# Patient Record
Sex: Male | Born: 1945 | Race: Black or African American | Hispanic: No | Marital: Married | Smoking: Former smoker
Health system: Southern US, Community
[De-identification: ages and names within clinical notes are randomized; demographics above are authoritative.]

## PROBLEM LIST (undated history)

## (undated) DIAGNOSIS — M109 Gout, unspecified: Secondary | ICD-10-CM

## (undated) DIAGNOSIS — M1990 Unspecified osteoarthritis, unspecified site: Secondary | ICD-10-CM

## (undated) DIAGNOSIS — I10 Essential (primary) hypertension: Secondary | ICD-10-CM

## (undated) DIAGNOSIS — K219 Gastro-esophageal reflux disease without esophagitis: Secondary | ICD-10-CM

## (undated) DIAGNOSIS — K5900 Constipation, unspecified: Secondary | ICD-10-CM

## (undated) DIAGNOSIS — E119 Type 2 diabetes mellitus without complications: Secondary | ICD-10-CM

## (undated) DIAGNOSIS — F0390 Unspecified dementia without behavioral disturbance: Secondary | ICD-10-CM

## (undated) DIAGNOSIS — M1712 Unilateral primary osteoarthritis, left knee: Secondary | ICD-10-CM

## (undated) HISTORY — PX: NO PAST SURGERIES: SHX2092

---

## 2013-02-16 ENCOUNTER — Encounter (HOSPITAL_COMMUNITY): Payer: Self-pay | Admitting: Respiratory Therapy

## 2013-02-20 ENCOUNTER — Encounter (HOSPITAL_COMMUNITY)
Admission: RE | Admit: 2013-02-20 | Discharge: 2013-02-20 | Disposition: A | Discharge status: Home / Self Care | Payer: Medicare Other | Source: Ambulatory Visit | Attending: Anesthesiology | Admitting: Anesthesiology

## 2013-02-20 ENCOUNTER — Encounter (HOSPITAL_COMMUNITY): Payer: Self-pay

## 2013-02-20 ENCOUNTER — Encounter (HOSPITAL_COMMUNITY)
Admission: RE | Admit: 2013-02-20 | Discharge: 2013-02-20 | Disposition: A | Discharge status: Home / Self Care | Payer: Medicare Other | Source: Ambulatory Visit | Attending: Orthopedic Surgery | Admitting: Orthopedic Surgery

## 2013-02-20 DIAGNOSIS — Z01811 Encounter for preprocedural respiratory examination: Secondary | ICD-10-CM | POA: Insufficient documentation

## 2013-02-20 DIAGNOSIS — Z01818 Encounter for other preprocedural examination: Secondary | ICD-10-CM | POA: Insufficient documentation

## 2013-02-20 DIAGNOSIS — Z01812 Encounter for preprocedural laboratory examination: Secondary | ICD-10-CM | POA: Insufficient documentation

## 2013-02-20 DIAGNOSIS — Z01810 Encounter for preprocedural cardiovascular examination: Secondary | ICD-10-CM | POA: Insufficient documentation

## 2013-02-20 HISTORY — DX: Type 2 diabetes mellitus without complications: E11.9

## 2013-02-20 HISTORY — DX: Constipation, unspecified: K59.00

## 2013-02-20 HISTORY — DX: Unspecified osteoarthritis, unspecified site: M19.90

## 2013-02-20 HISTORY — DX: Gout, unspecified: M10.9

## 2013-02-20 HISTORY — DX: Essential (primary) hypertension: I10

## 2013-02-20 HISTORY — DX: Gastro-esophageal reflux disease without esophagitis: K21.9

## 2013-02-20 LAB — PROTIME-INR
INR: 1.01 (ref 0.00–1.49)
Prothrombin Time: 13.1 seconds (ref 11.6–15.2)

## 2013-02-20 LAB — BASIC METABOLIC PANEL
BUN: 18 mg/dL (ref 6–23)
CHLORIDE: 102 meq/L (ref 96–112)
CO2: 27 mEq/L (ref 19–32)
Calcium: 9.2 mg/dL (ref 8.4–10.5)
Creatinine, Ser: 1.12 mg/dL (ref 0.50–1.35)
GFR calc non Af Amer: 67 mL/min — ABNORMAL LOW (ref >90)
GFR, EST AFRICAN AMERICAN: 78 mL/min — AB (ref >90)
Glucose, Bld: 116 mg/dL — ABNORMAL HIGH (ref 70–99)
Potassium: 4.2 mEq/L (ref 3.7–5.3)
Sodium: 141 mEq/L (ref 137–147)

## 2013-02-20 LAB — TYPE AND SCREEN
ABO/RH(D): A POS
ANTIBODY SCREEN: NEGATIVE

## 2013-02-20 LAB — CBC
HEMATOCRIT: 39.6 % (ref 39.0–52.0)
Hemoglobin: 13 g/dL (ref 13.0–17.0)
MCH: 28.3 pg (ref 26.0–34.0)
MCHC: 32.8 g/dL (ref 30.0–36.0)
MCV: 86.3 fL (ref 78.0–100.0)
Platelets: 167 10*3/uL (ref 150–400)
RBC: 4.59 MIL/uL (ref 4.22–5.81)
RDW: 15 % (ref 11.5–15.5)
WBC: 6.8 10*3/uL (ref 4.0–10.5)

## 2013-02-20 LAB — SURGICAL PCR SCREEN
MRSA, PCR: NEGATIVE
STAPHYLOCOCCUS AUREUS: NEGATIVE

## 2013-02-20 LAB — ABO/RH: ABO/RH(D): A POS

## 2013-02-20 LAB — APTT: aPTT: 27 seconds (ref 24–37)

## 2013-02-20 NOTE — Progress Notes (Signed)
02/20/13 1434  OBSTRUCTIVE SLEEP APNEA  Have you ever been diagnosed with sleep apnea through a sleep study? No  Do you snore loudly (loud enough to be heard through closed doors)?  0  Do you often feel tired, fatigued, or sleepy during the daytime? 0  Has anyone observed you stop breathing during your sleep? 1  Do you have, or are you being treated for high blood pressure? 1  BMI more than 35 kg/m2? 0  Age over 66 years old? 1  Neck circumference greater than 40 cm/18 inches? 0 (17)  Gender: 1  Obstructive Sleep Apnea Score 4  Score 4 or greater  Results sent to PCP

## 2013-02-20 NOTE — Pre-Procedure Instructions (Signed)
Purcell NailsMarvin Stevens  02/20/2013   Your procedure is scheduled on:  Tuesday, January 20.  [facility], Main [facility]trance / Entrance "A" at 8:30AM.  Call this number if you have problems the morning of surgery: [phone]   Remember:   Do not eat food or drink liquids after midnight.   Take these medicines the morning of surgery with A SIP OF WATER:None.       Stop taking Naproxen.   Do not wear jewelry, make-up or nail polish.  Do not wear lotions, powders, or perfumes. You may wear deodorant.   Men may shave face and neck.  Do not bring valuables to the hospital.  [facility] is not responsible  for any belongings or valuables.               Contacts, dentures or bridgework may not be worn into surgery.  Leave suitcase in the car. After surgery it may be brought to your room.  For patients admitted to the hospital, discharge time is determined by your treatment team.                Special Instructions: Shower using CHG 2 nights before surgery and the night before surgery.  If you shower the day of surgery use CHG.  Use special wash - you have one bottle of CHG for all showers.  You should use approximately 1/3 of the bottle for each shower.   Please read over the following fact sheets that you were given: Pain Booklet, Coughing and Deep Breathing, Blood Transfusion Information and Surgical Site Infection Prevention

## 2013-02-26 NOTE — Progress Notes (Addendum)
Spoke with daughter Shann MedalShamell to make pt aware of new arrival time of 8:30AM. Spoke with pt wife Steward DroneBrenda regarding new arrival time as well.

## 2013-02-26 NOTE — Progress Notes (Signed)
Anesthesia Note:  I received a call from Dr. Shelba FlakeLandau's office around 4:30 PM.  They had requested clearance from patient's medical doctor, Dr. Polly [place]anji in [place].  When Sherri called for follow-up today, she learned that patient was sent [facility] for a stress test this morning.  Unfortunately, results are still not available.  I was transferred to [facility]'s stress test department and later to radiology--both without an answer.  I was told by the operator that she thought staff in the nuclear medicine department left at 4:30PM.  Medical records stated that they do not have a report yet available, and that it was scheduled for a 24 hour turn over.  It is unlikely the report will be available by 5:30 AM tomorrow, so at this point, Dr. Dion SaucierLandau is planning to reschedule patient's surgery.   Velna Ochsllison Jaylene Schrom, PA-C [facility]/Anesthesiology Phone [phone] 02/26/2013 5:08 PM

## 2013-02-27 ENCOUNTER — Inpatient Hospital Stay (HOSPITAL_COMMUNITY): Payer: Medicare Other | Admitting: Certified Registered Nurse Anesthetist

## 2013-02-27 ENCOUNTER — Inpatient Hospital Stay (HOSPITAL_COMMUNITY): Payer: Medicare Other

## 2013-02-27 ENCOUNTER — Encounter (HOSPITAL_COMMUNITY)
Admission: RE | Disposition: A | Discharge status: Home Health | Payer: Self-pay | Source: Ambulatory Visit | Attending: Orthopedic Surgery

## 2013-02-27 ENCOUNTER — Encounter (HOSPITAL_COMMUNITY): Payer: Medicare Other | Admitting: Vascular Surgery

## 2013-02-27 ENCOUNTER — Inpatient Hospital Stay (HOSPITAL_COMMUNITY)
Admission: RE | Admit: 2013-02-27 | Discharge: 2013-02-28 | DRG: 470 | Disposition: A | Discharge status: Home Health | Payer: Medicare Other | Source: Ambulatory Visit | Attending: Orthopedic Surgery | Admitting: Orthopedic Surgery

## 2013-02-27 ENCOUNTER — Encounter (HOSPITAL_COMMUNITY): Payer: Self-pay | Admitting: Certified Registered Nurse Anesthetist

## 2013-02-27 DIAGNOSIS — Z7982 Long term (current) use of aspirin: Secondary | ICD-10-CM

## 2013-02-27 DIAGNOSIS — Z7901 Long term (current) use of anticoagulants: Secondary | ICD-10-CM

## 2013-02-27 DIAGNOSIS — I10 Essential (primary) hypertension: Secondary | ICD-10-CM | POA: Diagnosis present

## 2013-02-27 DIAGNOSIS — M1710 Unilateral primary osteoarthritis, unspecified knee: Principal | ICD-10-CM | POA: Diagnosis present

## 2013-02-27 DIAGNOSIS — Z79899 Other long term (current) drug therapy: Secondary | ICD-10-CM

## 2013-02-27 DIAGNOSIS — M179 Osteoarthritis of knee, unspecified: Secondary | ICD-10-CM | POA: Diagnosis present

## 2013-02-27 DIAGNOSIS — M1712 Unilateral primary osteoarthritis, left knee: Secondary | ICD-10-CM | POA: Diagnosis present

## 2013-02-27 DIAGNOSIS — E119 Type 2 diabetes mellitus without complications: Secondary | ICD-10-CM | POA: Diagnosis present

## 2013-02-27 DIAGNOSIS — M109 Gout, unspecified: Secondary | ICD-10-CM | POA: Diagnosis present

## 2013-02-27 DIAGNOSIS — Z87891 Personal history of nicotine dependence: Secondary | ICD-10-CM

## 2013-02-27 DIAGNOSIS — K219 Gastro-esophageal reflux disease without esophagitis: Secondary | ICD-10-CM | POA: Diagnosis present

## 2013-02-27 HISTORY — PX: TOTAL KNEE ARTHROPLASTY: SHX125

## 2013-02-27 HISTORY — DX: Unilateral primary osteoarthritis, left knee: M17.12

## 2013-02-27 LAB — CBC
HCT: 36.4 % — ABNORMAL LOW (ref 39.0–52.0)
Hemoglobin: 11.8 g/dL — ABNORMAL LOW (ref 13.0–17.0)
MCH: 28 pg (ref 26.0–34.0)
MCHC: 32.4 g/dL (ref 30.0–36.0)
MCV: 86.3 fL (ref 78.0–100.0)
Platelets: 171 10*3/uL (ref 150–400)
RBC: 4.22 MIL/uL (ref 4.22–5.81)
RDW: 14.7 % (ref 11.5–15.5)
WBC: 12.8 10*3/uL — ABNORMAL HIGH (ref 4.0–10.5)

## 2013-02-27 LAB — CREATININE, SERUM
CREATININE: 1.12 mg/dL (ref 0.50–1.35)
GFR calc Af Amer: 78 mL/min — ABNORMAL LOW (ref >90)
GFR calc non Af Amer: 67 mL/min — ABNORMAL LOW (ref >90)

## 2013-02-27 LAB — GLUCOSE, CAPILLARY
GLUCOSE-CAPILLARY: 117 mg/dL — AB (ref 70–99)
GLUCOSE-CAPILLARY: 154 mg/dL — AB (ref 70–99)
Glucose-Capillary: 119 mg/dL — ABNORMAL HIGH (ref 70–99)
Glucose-Capillary: 191 mg/dL — ABNORMAL HIGH (ref 70–99)

## 2013-02-27 SURGERY — ARTHROPLASTY, KNEE, TOTAL
Anesthesia: General | Laterality: Left

## 2013-02-27 MED ORDER — HYDROMORPHONE HCL PF 1 MG/ML IJ SOLN
INTRAMUSCULAR | Status: AC
  Filled 2013-02-27: qty 1

## 2013-02-27 MED ORDER — SENNA 8.6 MG PO TABS
1.0000 | ORAL_TABLET | Freq: Two times a day (BID) | ORAL | Status: DC
  Administered 2013-02-27 – 2013-02-28 (×2): 8.6 mg via ORAL
  Filled 2013-02-27 (×3): qty 1

## 2013-02-27 MED ORDER — ONDANSETRON HCL 4 MG/2ML IJ SOLN
4.0000 mg | Freq: Four times a day (QID) | INTRAMUSCULAR | Status: DC | PRN
  Administered 2013-02-28: 4 mg via INTRAVENOUS
  Filled 2013-02-27: qty 2

## 2013-02-27 MED ORDER — ALLOPURINOL 300 MG PO TABS
300.0000 mg | ORAL_TABLET | Freq: Every day | ORAL | Status: DC
  Administered 2013-02-27 – 2013-02-28 (×2): 300 mg via ORAL
  Filled 2013-02-27 (×2): qty 1

## 2013-02-27 MED ORDER — MIDAZOLAM HCL 5 MG/5ML IJ SOLN
INTRAMUSCULAR | Status: DC | PRN
  Administered 2013-02-27: 2 mg via INTRAVENOUS

## 2013-02-27 MED ORDER — PROMETHAZINE HCL 25 MG PO TABS: 25.0000 mg | ORAL_TABLET | Freq: Four times a day (QID) | ORAL | Status: DC | PRN

## 2013-02-27 MED ORDER — METOCLOPRAMIDE HCL 5 MG/ML IJ SOLN: 5.0000 mg | Freq: Three times a day (TID) | INTRAMUSCULAR | Status: DC | PRN

## 2013-02-27 MED ORDER — ENOXAPARIN SODIUM 30 MG/0.3ML ~~LOC~~ SOLN: 30.0000 mg | Freq: Two times a day (BID) | SUBCUTANEOUS | Status: DC

## 2013-02-27 MED ORDER — HYDROCHLOROTHIAZIDE 25 MG PO TABS
25.0000 mg | ORAL_TABLET | Freq: Every day | ORAL | Status: DC
  Administered 2013-02-27 – 2013-02-28 (×2): 25 mg via ORAL
  Filled 2013-02-27 (×2): qty 1

## 2013-02-27 MED ORDER — MENTHOL 3 MG MT LOZG: 1.0000 | LOZENGE | OROMUCOSAL | Status: DC | PRN

## 2013-02-27 MED ORDER — ACETAMINOPHEN 325 MG PO TABS: 650.0000 mg | ORAL_TABLET | Freq: Four times a day (QID) | ORAL | Status: DC | PRN

## 2013-02-27 MED ORDER — CHLORHEXIDINE GLUCONATE CLOTH 2 % EX PADS: 6.0000 | MEDICATED_PAD | Freq: Once | CUTANEOUS | Status: DC

## 2013-02-27 MED ORDER — ASPIRIN 81 MG PO CHEW
81.0000 mg | CHEWABLE_TABLET | Freq: Every day | ORAL | Status: DC
  Administered 2013-02-28: 81 mg via ORAL
  Filled 2013-02-27: qty 1

## 2013-02-27 MED ORDER — PROPOFOL 10 MG/ML IV BOLUS
INTRAVENOUS | Status: DC | PRN
Start: 2013-02-27 — End: 2013-02-27
  Administered 2013-02-27: 200 mg via INTRAVENOUS

## 2013-02-27 MED ORDER — ASPIRIN 81 MG PO TABS: 81.0000 mg | ORAL_TABLET | Freq: Every day | ORAL | Status: DC

## 2013-02-27 MED ORDER — FENTANYL CITRATE 0.05 MG/ML IJ SOLN
INTRAMUSCULAR | Status: AC
  Filled 2013-02-27: qty 2

## 2013-02-27 MED ORDER — SENNA-DOCUSATE SODIUM 8.6-50 MG PO TABS: 2.0000 | ORAL_TABLET | Freq: Every day | ORAL | Status: DC

## 2013-02-27 MED ORDER — ONDANSETRON HCL 4 MG/2ML IJ SOLN: 4.0000 mg | Freq: Once | INTRAMUSCULAR | Status: DC | PRN

## 2013-02-27 MED ORDER — BISACODYL 10 MG RE SUPP: 10.0000 mg | Freq: Every day | RECTAL | Status: DC | PRN

## 2013-02-27 MED ORDER — BUPIVACAINE HCL (PF) 0.25 % IJ SOLN
INTRAMUSCULAR | Status: AC
  Filled 2013-02-27: qty 30

## 2013-02-27 MED ORDER — METFORMIN HCL 500 MG PO TABS
500.0000 mg | ORAL_TABLET | Freq: Two times a day (BID) | ORAL | Status: DC
  Administered 2013-02-27 – 2013-02-28 (×2): 500 mg via ORAL
  Filled 2013-02-27 (×4): qty 1

## 2013-02-27 MED ORDER — MIDAZOLAM HCL 2 MG/2ML IJ SOLN
INTRAMUSCULAR | Status: AC
  Filled 2013-02-27: qty 2

## 2013-02-27 MED ORDER — LACTATED RINGERS IV SOLN
INTRAVENOUS | Status: DC | PRN
  Administered 2013-02-27 (×2): via INTRAVENOUS

## 2013-02-27 MED ORDER — METOCLOPRAMIDE HCL 10 MG PO TABS: 5.0000 mg | ORAL_TABLET | Freq: Three times a day (TID) | ORAL | Status: DC | PRN

## 2013-02-27 MED ORDER — ACETAMINOPHEN 650 MG RE SUPP: 650.0000 mg | Freq: Four times a day (QID) | RECTAL | Status: DC | PRN

## 2013-02-27 MED ORDER — HYDROMORPHONE HCL PF 1 MG/ML IJ SOLN
0.2500 mg | INTRAMUSCULAR | Status: DC | PRN
  Administered 2013-02-27 (×5): 0.25 mg via INTRAVENOUS

## 2013-02-27 MED ORDER — INSULIN ASPART 100 UNIT/ML ~~LOC~~ SOLN
0.0000 [IU] | Freq: Three times a day (TID) | SUBCUTANEOUS | Status: DC
  Administered 2013-02-27: 3 [IU] via SUBCUTANEOUS
  Administered 2013-02-28: 2 [IU] via SUBCUTANEOUS
  Administered 2013-02-28: 5 [IU] via SUBCUTANEOUS

## 2013-02-27 MED ORDER — ONDANSETRON HCL 4 MG PO TABS: 4.0000 mg | ORAL_TABLET | Freq: Four times a day (QID) | ORAL | Status: DC | PRN

## 2013-02-27 MED ORDER — LACTATED RINGERS IV SOLN
INTRAVENOUS | Status: DC
  Administered 2013-02-27: 50 mL/h via INTRAVENOUS

## 2013-02-27 MED ORDER — OXYCODONE HCL 5 MG PO TABS: 5.0000 mg | ORAL_TABLET | ORAL | Status: DC | PRN

## 2013-02-27 MED ORDER — LIDOCAINE HCL (CARDIAC) 20 MG/ML IV SOLN
INTRAVENOUS | Status: DC | PRN
  Administered 2013-02-27: 100 mg via INTRAVENOUS

## 2013-02-27 MED ORDER — HYDROMORPHONE HCL PF 1 MG/ML IJ SOLN
INTRAMUSCULAR | Status: AC
  Administered 2013-02-27: 0.25 mg via INTRAVENOUS
  Filled 2013-02-27: qty 1

## 2013-02-27 MED ORDER — ZOLPIDEM TARTRATE 5 MG PO TABS: 5.0000 mg | ORAL_TABLET | Freq: Every evening | ORAL | Status: DC | PRN

## 2013-02-27 MED ORDER — CEFAZOLIN SODIUM-DEXTROSE 2-3 GM-% IV SOLR
2.0000 g | INTRAVENOUS | Status: AC
  Administered 2013-02-27: 2 g via INTRAVENOUS
  Filled 2013-02-27: qty 50

## 2013-02-27 MED ORDER — SODIUM CHLORIDE 0.9 % IR SOLN
Status: DC | PRN
  Administered 2013-02-27: 1000 mL
  Administered 2013-02-27: 3000 mL

## 2013-02-27 MED ORDER — DIPHENHYDRAMINE HCL 12.5 MG/5ML PO ELIX: 12.5000 mg | ORAL_SOLUTION | ORAL | Status: DC | PRN

## 2013-02-27 MED ORDER — METHOCARBAMOL 500 MG PO TABS: 500.0000 mg | ORAL_TABLET | Freq: Four times a day (QID) | ORAL | Status: DC | PRN

## 2013-02-27 MED ORDER — ARTIFICIAL TEARS OP OINT
TOPICAL_OINTMENT | OPHTHALMIC | Status: DC | PRN
  Administered 2013-02-27: 1 via OPHTHALMIC

## 2013-02-27 MED ORDER — METHOCARBAMOL 100 MG/ML IJ SOLN
500.0000 mg | Freq: Four times a day (QID) | INTRAVENOUS | Status: DC | PRN
  Filled 2013-02-27: qty 5

## 2013-02-27 MED ORDER — OXYCODONE HCL 5 MG PO TABS
5.0000 mg | ORAL_TABLET | Freq: Once | ORAL | Status: AC | PRN
  Administered 2013-02-27: 5 mg via ORAL

## 2013-02-27 MED ORDER — GLIMEPIRIDE 4 MG PO TABS
4.0000 mg | ORAL_TABLET | Freq: Every day | ORAL | Status: DC
  Administered 2013-02-28: 4 mg via ORAL
  Filled 2013-02-27 (×2): qty 1

## 2013-02-27 MED ORDER — ENOXAPARIN SODIUM 30 MG/0.3ML ~~LOC~~ SOLN
30.0000 mg | Freq: Two times a day (BID) | SUBCUTANEOUS | Status: DC
  Administered 2013-02-28: 30 mg via SUBCUTANEOUS
  Filled 2013-02-27 (×3): qty 0.3

## 2013-02-27 MED ORDER — FENTANYL CITRATE 0.05 MG/ML IJ SOLN
INTRAMUSCULAR | Status: DC | PRN
Start: 2013-02-27 — End: 2013-02-27
  Administered 2013-02-27 (×4): 50 ug via INTRAVENOUS
  Administered 2013-02-27: 25 ug via INTRAVENOUS
  Administered 2013-02-27 (×2): 50 ug via INTRAVENOUS
  Administered 2013-02-27: 25 ug via INTRAVENOUS
  Administered 2013-02-27 (×4): 50 ug via INTRAVENOUS

## 2013-02-27 MED ORDER — PHENOL 1.4 % MT LIQD: 1.0000 | OROMUCOSAL | Status: DC | PRN

## 2013-02-27 MED ORDER — ALUM & MAG HYDROXIDE-SIMETH 200-200-20 MG/5ML PO SUSP
30.0000 mL | ORAL | Status: DC | PRN
Start: 2013-02-27 — End: 2013-02-28
  Administered 2013-02-27: 30 mL via ORAL
  Filled 2013-02-27: qty 30

## 2013-02-27 MED ORDER — OXYCODONE HCL 5 MG PO TABS
ORAL_TABLET | ORAL | Status: AC
  Filled 2013-02-27: qty 1

## 2013-02-27 MED ORDER — FLEET ENEMA 7-19 GM/118ML RE ENEM: 1.0000 | ENEMA | Freq: Once | RECTAL | Status: AC | PRN

## 2013-02-27 MED ORDER — CEFAZOLIN SODIUM-DEXTROSE 2-3 GM-% IV SOLR
2.0000 g | Freq: Four times a day (QID) | INTRAVENOUS | Status: AC
  Administered 2013-02-27 (×2): 2 g via INTRAVENOUS
  Filled 2013-02-27 (×2): qty 50

## 2013-02-27 MED ORDER — HYDROMORPHONE HCL PF 1 MG/ML IJ SOLN: 1.0000 mg | INTRAMUSCULAR | Status: DC | PRN

## 2013-02-27 MED ORDER — LOSARTAN POTASSIUM 50 MG PO TABS
50.0000 mg | ORAL_TABLET | Freq: Every day | ORAL | Status: DC
  Administered 2013-02-27 – 2013-02-28 (×2): 50 mg via ORAL
  Filled 2013-02-27 (×2): qty 1

## 2013-02-27 MED ORDER — ONDANSETRON HCL 4 MG/2ML IJ SOLN
INTRAMUSCULAR | Status: DC | PRN
  Administered 2013-02-27: 4 mg via INTRAVENOUS

## 2013-02-27 MED ORDER — OXYCODONE HCL 5 MG/5ML PO SOLN: 5.0000 mg | Freq: Once | ORAL | Status: AC | PRN

## 2013-02-27 MED ORDER — DOCUSATE SODIUM 100 MG PO CAPS
100.0000 mg | ORAL_CAPSULE | Freq: Two times a day (BID) | ORAL | Status: DC
  Administered 2013-02-27 – 2013-02-28 (×2): 100 mg via ORAL
  Filled 2013-02-27 (×3): qty 1

## 2013-02-27 MED ORDER — METHOCARBAMOL 500 MG PO TABS: 500.0000 mg | ORAL_TABLET | Freq: Four times a day (QID) | ORAL | Status: DC

## 2013-02-27 MED ORDER — OXYCODONE-ACETAMINOPHEN 10-325 MG PO TABS: 1.0000 | ORAL_TABLET | Freq: Four times a day (QID) | ORAL | Status: DC | PRN

## 2013-02-27 MED ORDER — BUPIVACAINE-EPINEPHRINE PF 0.5-1:200000 % IJ SOLN
INTRAMUSCULAR | Status: DC | PRN
  Administered 2013-02-27: 30 mL via PERINEURAL

## 2013-02-27 MED ORDER — KETOROLAC TROMETHAMINE 15 MG/ML IJ SOLN
7.5000 mg | Freq: Four times a day (QID) | INTRAMUSCULAR | Status: AC
  Administered 2013-02-27 – 2013-02-28 (×4): 7.5 mg via INTRAVENOUS
  Filled 2013-02-27 (×5): qty 1

## 2013-02-27 MED ORDER — POLYETHYLENE GLYCOL 3350 17 G PO PACK: 17.0000 g | PACK | Freq: Every day | ORAL | Status: DC | PRN

## 2013-02-27 MED ORDER — ATORVASTATIN CALCIUM 20 MG PO TABS
20.0000 mg | ORAL_TABLET | Freq: Every day | ORAL | Status: DC
  Administered 2013-02-27 – 2013-02-28 (×2): 20 mg via ORAL
  Filled 2013-02-27 (×2): qty 1

## 2013-02-27 MED ORDER — MEPERIDINE HCL 25 MG/ML IJ SOLN: 6.2500 mg | INTRAMUSCULAR | Status: DC | PRN

## 2013-02-27 MED ORDER — POTASSIUM CHLORIDE IN NACL 20-0.45 MEQ/L-% IV SOLN
INTRAVENOUS | Status: DC
  Administered 2013-02-27: 17:00:00 via INTRAVENOUS
  Filled 2013-02-27 (×3): qty 1000

## 2013-02-27 SURGICAL SUPPLY — 68 items
BANDAGE ELASTIC 6 VELCRO ST LF (GAUZE/BANDAGES/DRESSINGS) IMPLANT
BANDAGE ESMARK 6X9 LF (GAUZE/BANDAGES/DRESSINGS) ×1 IMPLANT
BENZOIN TINCTURE PRP APPL 2/3 (GAUZE/BANDAGES/DRESSINGS) ×2 IMPLANT
BLADE SAG 18X100X1.27 (BLADE) ×2 IMPLANT
BLADE SAW RECIP 87.9 MT (BLADE) ×2 IMPLANT
BLADE SAW SGTL 13X75X1.27 (BLADE) ×2 IMPLANT
BNDG ESMARK 6X9 LF (GAUZE/BANDAGES/DRESSINGS) ×2
BOOTCOVER CLEANROOM LRG (PROTECTIVE WEAR) ×4 IMPLANT
BOWL SMART MIX CTS (DISPOSABLE) ×2 IMPLANT
CAPT FB KNEE W/COCR XLINK ×2 IMPLANT
CEMENT HV SMART SET (Cement) ×4 IMPLANT
CLOTH BEACON ORANGE TIMEOUT ST (SAFETY) ×2 IMPLANT
CLSR STERI-STRIP ANTIMIC 1/2X4 (GAUZE/BANDAGES/DRESSINGS) ×2 IMPLANT
COVER SURGICAL LIGHT HANDLE (MISCELLANEOUS) ×2 IMPLANT
CUFF TOURNIQUET SINGLE 34IN LL (TOURNIQUET CUFF) IMPLANT
DRAPE EXTREMITY T 121X128X90 (DRAPE) ×2 IMPLANT
DRAPE PROXIMA HALF (DRAPES) ×4 IMPLANT
DRAPE U-SHAPE 47X51 STRL (DRAPES) ×2 IMPLANT
DRSG PAD ABDOMINAL 8X10 ST (GAUZE/BANDAGES/DRESSINGS) ×2 IMPLANT
DURAPREP 26ML APPLICATOR (WOUND CARE) ×4 IMPLANT
ELECT CAUTERY BLADE 6.4 (BLADE) ×2 IMPLANT
ELECT REM PT RETURN 9FT ADLT (ELECTROSURGICAL) ×2
ELECTRODE REM PT RTRN 9FT ADLT (ELECTROSURGICAL) ×1 IMPLANT
FACESHIELD LNG OPTICON STERILE (SAFETY) ×2 IMPLANT
GLOVE BIO SURGEON STRL SZ 6.5 (GLOVE) ×4 IMPLANT
GLOVE BIO SURGEON STRL SZ7 (GLOVE) ×2 IMPLANT
GLOVE BIOGEL PI IND STRL 6.5 (GLOVE) ×5 IMPLANT
GLOVE BIOGEL PI IND STRL 7.0 (GLOVE) ×2 IMPLANT
GLOVE BIOGEL PI INDICATOR 6.5 (GLOVE) ×5
GLOVE BIOGEL PI INDICATOR 7.0 (GLOVE) ×2
GLOVE BIOGEL PI ORTHO PRO SZ8 (GLOVE) ×2
GLOVE ECLIPSE 6.5 STRL STRAW (GLOVE) ×2 IMPLANT
GLOVE ORTHO TXT STRL SZ7.5 (GLOVE) ×2 IMPLANT
GLOVE PI ORTHO PRO STRL SZ8 (GLOVE) ×2 IMPLANT
GLOVE SURG ORTHO 8.0 STRL STRW (GLOVE) ×2 IMPLANT
GLOVE SURG SS PI 7.0 STRL IVOR (GLOVE) ×2 IMPLANT
GOWN BRE IMP PREV XXLGXLNG (GOWN DISPOSABLE) ×2 IMPLANT
GOWN STRL NON-REIN LRG LVL3 (GOWN DISPOSABLE) ×6 IMPLANT
GOWN STRL REIN XL XLG (GOWN DISPOSABLE) IMPLANT
HANDPIECE INTERPULSE COAX TIP (DISPOSABLE) ×1
HOOD PEEL AWAY FACE SHEILD DIS (HOOD) ×4 IMPLANT
IMMOBILIZER KNEE 22 UNIV (SOFTGOODS) ×2 IMPLANT
KIT BASIN OR (CUSTOM PROCEDURE TRAY) ×2 IMPLANT
KIT ROOM TURNOVER OR (KITS) ×2 IMPLANT
MANIFOLD NEPTUNE II (INSTRUMENTS) ×2 IMPLANT
NS IRRIG 1000ML POUR BTL (IV SOLUTION) ×2 IMPLANT
PACK TOTAL JOINT (CUSTOM PROCEDURE TRAY) ×2 IMPLANT
PAD ARMBOARD 7.5X6 YLW CONV (MISCELLANEOUS) ×4 IMPLANT
PAD CAST 4YDX4 CTTN HI CHSV (CAST SUPPLIES) IMPLANT
PADDING CAST ABS 4INX4YD NS (CAST SUPPLIES) ×1
PADDING CAST ABS COTTON 4X4 ST (CAST SUPPLIES) ×1 IMPLANT
PADDING CAST COTTON 4X4 STRL (CAST SUPPLIES)
PADDING CAST COTTON 6X4 STRL (CAST SUPPLIES) ×2 IMPLANT
SET HNDPC FAN SPRY TIP SCT (DISPOSABLE) ×1 IMPLANT
SPONGE GAUZE 4X4 12PLY (GAUZE/BANDAGES/DRESSINGS) ×2 IMPLANT
STAPLER VISISTAT 35W (STAPLE) IMPLANT
SUCTION FRAZIER TIP 10 FR DISP (SUCTIONS) ×2 IMPLANT
SUT MNCRL AB 4-0 PS2 18 (SUTURE) ×2 IMPLANT
SUT VIC AB 0 CT1 27 (SUTURE) ×1
SUT VIC AB 0 CT1 27XBRD ANBCTR (SUTURE) ×1 IMPLANT
SUT VIC AB 2-0 CT1 27 (SUTURE) ×1
SUT VIC AB 2-0 CT1 TAPERPNT 27 (SUTURE) ×1 IMPLANT
SUT VIC AB 3-0 SH 8-18 (SUTURE) ×2 IMPLANT
SYR 30ML LL (SYRINGE) ×2 IMPLANT
TOWEL OR 17X24 6PK STRL BLUE (TOWEL DISPOSABLE) ×2 IMPLANT
TOWEL OR 17X26 10 PK STRL BLUE (TOWEL DISPOSABLE) ×2 IMPLANT
TRAY FOLEY CATH 16FRSI W/METER (SET/KITS/TRAYS/PACK) IMPLANT
WATER STERILE IRR 1000ML POUR (IV SOLUTION) ×4 IMPLANT

## 2013-02-27 NOTE — Progress Notes (Signed)
[facility]. And they will call back re; if test has been resulted.

## 2013-02-27 NOTE — Preoperative (Signed)
Beta Blockers   Reason not to administer Beta Blockers:Not Applicable 

## 2013-02-27 NOTE — Transfer of Care (Signed)
Immediate Anesthesia Transfer of Care Note  Patient: Terry Stevens  Procedure(s) Performed: Procedure(s): TOTAL KNEE ARTHROPLASTY (Left)  Patient Location: PACU  Anesthesia Type:General  Level of Consciousness: awake, alert , oriented and patient cooperative  Airway & Oxygen Therapy: Patient Spontanous Breathing and Patient connected to nasal cannula oxygen  Post-op Assessment: Report given to PACU RN and Post -op Vital signs reviewed and stable  Post vital signs: Reviewed and stable  Complications: No apparent anesthesia complications

## 2013-02-27 NOTE — Anesthesia Procedure Notes (Addendum)
Anesthesia Regional Block:  Femoral nerve block  Pre-Anesthetic Checklist: ,, timeout performed, Correct Patient, Correct Site, Correct Laterality, Correct Procedure, Correct Position, site marked, Risks and benefits discussed,  Surgical consent,  Pre-op evaluation,  At surgeon's request and post-op pain management  Laterality: Left  Prep: chloraprep       Needles:  Injection technique: Single-shot  Needle Type: Echogenic Stimulator Needle      Needle Gauge: 21 and 21 G    Additional Needles:  Procedures: ultrasound guided (picture in chart) and nerve stimulator Femoral nerve block  Nerve Stimulator or Paresthesia:  Response: 0.4 mA,   Additional Responses:   Narrative:  Start time: 02/27/2013 10:05 AM End time: 02/27/2013 10:20 AM Injection made incrementally with aspirations every 5 mL.  Performed by: Personally  Anesthesiologist: Arta BruceKevin Ossey MD  Additional Notes: Monitors applied. Patient sedated. Sterile prep and drape,hand hygiene and sterile gloves were used. Relevant anatomy identified.Needle position confirmed.Local anesthetic injected incrementally after negative aspiration. Local anesthetic spread visualized around nerve(s). Vascular puncture avoided. No complications. Image printed for medical record.The patient tolerated the procedure well.        Procedure Name: LMA Insertion Date/Time: 02/27/2013 11:05 AM Performed by: Angelica PouSMITH, Catalea Labrecque PIZZICARA Pre-anesthesia Checklist: Patient identified, Patient being monitored, Emergency Drugs available, Timeout performed and Suction available Patient Re-evaluated:Patient Re-evaluated prior to inductionOxygen Delivery Method: Circle system utilized Preoxygenation: Pre-oxygenation with 100% oxygen Intubation Type: IV induction LMA: LMA inserted LMA Size: 4.0 Placement Confirmation: breath sounds checked- equal and bilateral and positive ETCO2 Dental Injury: Teeth and Oropharynx as per pre-operative assessment

## 2013-02-27 NOTE — Op Note (Signed)
DATE OF SURGERY:  02/27/2013 TIME: 1:04 PM  PATIENT NAME:  Terry Stevens   AGE: 68 y.o.    PRE-OPERATIVE DIAGNOSIS:  DEJENERATIVE JOINT DISEASE LEFT KNEE  POST-OPERATIVE DIAGNOSIS:  Same  PROCEDURE:  Procedure(s): TOTAL KNEE ARTHROPLASTY   SURGEON:  Eulas PostLANDAU,Reznor Ferrando P, MD   ASSISTANT:  Janace LittenBrandon Parry, OPA-C, present and scrubbed throughout the case, critical for assistance with exposure, retraction, instrumentation, and closure.   OPERATIVE IMPLANTS: Depuy PFC Sigma, Posterior Stabilized.  Femur size 5, Tibia size 4, Patella size 41 3-peg oval button, with a 10 mm polyethylene insert.   PREOPERATIVE INDICATIONS:  Terry Stevens is a 68 y.o. year old male with end stage bone on bone degenerative arthritis of the knee who failed conservative treatment, including injections, antiinflammatories, activity modification, and assistive devices, and had significant impairment of their activities of daily living, and elected for Total Knee Arthroplasty.   The risks, benefits, and alternatives were discussed at length including but not limited to the risks of infection, bleeding, nerve injury, stiffness, blood clots, the need for revision surgery, cardiopulmonary complications, among others, and they were willing to proceed.   OPERATIVE DESCRIPTION:  The patient was brought to the operative room and placed in a supine position.  General anesthesia was administered.  IV antibiotics were given.  The lower extremity was prepped and draped in the usual sterile fashion.  Time out was performed.  The leg was elevated and exsanguinated and the tourniquet was inflated.  Anterior quadriceps tendon splitting approach was performed.  The patella was everted and osteophytes were removed.  There was a substantial amount of osteophyte, 2 rongeur widths worth circumferentially throughout the knee. The anterior horn of the medial and lateral meniscus was removed.   The distal femur was opened with the drill and  the intramedullary distal femoral cutting jig was utilized, set at 5 degrees resecting 10 mm off the distal femur.  Care was taken to protect the collateral ligaments.  Then the extramedullary tibial cutting jig was utilized making the appropriate cut using the anterior tibial crest as a reference building in appropriate posterior slope.  Care was taken during the cut to protect the medial and collateral ligaments.  The proximal tibia was removed along with the posterior horns of the menisci.  The PCL was sacrificed.    The extensor gap was measured and was approximately 10mm.    The distal femoral sizing jig was applied, taking care to avoid notching.  Then the 4-in-1 cutting jig was applied and the anterior and posterior femur was cut, along with the chamfer cuts.  All posterior osteophytes were removed.  The flexion gap was then measured and was symmetric with the extension gap.  I completed the distal femoral preparation using the appropriate jig to prepare the box.  The patella was then measured, and cut with the saw.  The patella measured 27 before the resection and approximately 15 after.  The proximal tibia sized and prepared accordingly with the reamer and the punch, and then all components were trialed with the 10mm poly insert.  The knee was found to have excellent balance and full motion.    The above named components were then cemented into place and all excess cement was removed.  The real polyethylene implant was placed.  The knee was easily taken through a range of motion and the patella tracked well and the knee irrigated copiously and the parapatellar and subcutaneous tissue closed with vicryl, and monocryl with steri strips for the  skin.  The wounds were injected with marcaine, and dressed with sterile gauze and the tourniquet released and the patient was awakened and returned to the PACU in stable and satisfactory condition.  There were no complications.  Total tourniquet time  was 110 minutes.

## 2013-02-27 NOTE — Discharge Instructions (Signed)
Diet: As you were doing prior to hospitalization   Shower:  May shower but keep the wounds dry, use an occlusive plastic wrap, NO SOAKING IN TUB.  If the bandage gets wet, change with a clean dry gauze.  Dressing:  You may change your dressing 3-5 days after surgery.  Then change the dressing daily with sterile gauze dressing.    There are sticky tapes (steri-strips) on your wounds and all the stitches are absorbable.  Leave the steri-strips in place when changing your dressings, they will peel off with time, usually 2-3 weeks.  Activity:  Increase activity slowly as tolerated, but follow the weight bearing instructions below.  No lifting or driving for 6 weeks.  Weight Bearing:   Weight bearing as tolerated.    To prevent constipation: you may use a stool softener such as -  Colace (over the counter) 100 mg by mouth twice a day  Drink plenty of fluids (prune juice may be helpful) and high fiber foods Miralax (over the counter) for constipation as needed.    Itching:  If you experience itching with your medications, try taking only a single pain pill, or even half a pain pill at a time.  You may take up to 10 pain pills per day, and you can also use benadryl over the counter for itching or also to help with sleep.   Precautions:  If you experience chest pain or shortness of breath - call 911 immediately for transfer to the hospital emergency department!!  If you develop a fever greater that 101 F, purulent drainage from wound, increased redness or drainage from wound, or calf pain -- Call the office at [phone]                                                Follow- Up Appointment:  Please call for an appointment to be seen in 2 [place] - [phone]

## 2013-02-27 NOTE — Anesthesia Postprocedure Evaluation (Signed)
Anesthesia Post Note  Patient: Terry Stevens  Procedure(s) Performed: Procedure(s) (LRB): TOTAL KNEE ARTHROPLASTY (Left)  Anesthesia type: general  Patient location: PACU  Post pain: Pain level controlled  Post assessment: Patient's Cardiovascular Status Stable  Last Vitals:  Filed Vitals:   02/27/13 1600  BP: 125/75  Pulse: 75  Temp: 36.7 C  Resp: 14    Post vital signs: Reviewed and stable  Level of consciousness: sedated  Complications: No apparent anesthesia complications

## 2013-02-27 NOTE — H&P (Signed)
  PREOPERATIVE H&P  Chief Complaint: DJD LEFT KNEE  HPI: Terry Stevens is a 68 y.o. male who presents for preoperative history and physical with a diagnosis of DJD LEFT KNEE. Symptoms are rated as moderate to severe, and have been worsening.  This is significantly impairing activities of daily living.  He has elected for surgical management. He has failed injections, nsaids, activity modification, assistive devices.  Past Medical History  Diagnosis Date  . Hypertension   . Gout   . Diabetes mellitus without complication   . Constipation   . GERD (gastroesophageal reflux disease)     tums prn  . Arthritis    Past Surgical History  Procedure Laterality Date  . No past surgeries     History   Social History  . Marital Status: Single    Spouse Name: N/A    Number of Children: N/A  . Years of Education: N/A   Social History Main Topics  . Smoking status: Former Games developermoker  . Smokeless tobacco: None     Comment: quit in late 1980's  . Alcohol Use: No  . Drug Use: No  . Sexual Activity: None   Other Topics Concern  . None   Social History Narrative  . None   History reviewed. No pertinent family history. No Known Allergies Prior to Admission medications   Medication Sig Start Date End Date Taking? Authorizing Provider  allopurinol (ZYLOPRIM) 300 MG tablet Take 300 mg by mouth daily.   Yes Historical Provider, MD  aspirin 81 MG tablet Take 81 mg by mouth daily.   Yes Historical Provider, MD  atorvastatin (LIPITOR) 20 MG tablet Take 20 mg by mouth daily.   Yes Historical Provider, MD  glimepiride (AMARYL) 4 MG tablet Take 4 mg by mouth daily with breakfast.   Yes Historical Provider, MD  hydrochlorothiazide (HYDRODIURIL) 25 MG tablet Take 25 mg by mouth daily.   Yes Historical Provider, MD  losartan (COZAAR) 50 MG tablet Take 50 mg by mouth daily.   Yes Historical Provider, MD  metFORMIN (GLUCOPHAGE) 500 MG tablet Take 500 mg by mouth 2 (two) times daily with a meal.   Yes  Historical Provider, MD  naproxen sodium (ANAPROX) 220 MG tablet Take 440 mg by mouth 2 (two) times daily with a meal.   Yes Historical Provider, MD     Positive ROS: All other systems have been reviewed and were otherwise negative with the exception of those mentioned in the HPI and as above.  Physical Exam: General: Alert, no acute distress Cardiovascular: No pedal edema Respiratory: No cyanosis, no use of accessory musculature GI: No organomegaly, abdomen is soft and non-tender Skin: No lesions in the area of chief complaint Neurologic: Sensation intact distally Psychiatric: Patient is competent for consent with normal mood and affect Lymphatic: No axillary or cervical lymphadenopathy  MUSCULOSKELETAL: left knee ROM 10-100, mild varus, crepitance.  XR with end stage DJD  Assessment: DJD LEFT KNEE  Plan: Plan for Procedure(s): TOTAL KNEE ARTHROPLASTY  The risks benefits and alternatives were discussed with the patient including but not limited to the risks of nonoperative treatment, versus surgical intervention including infection, bleeding, nerve injury,  blood clots, cardiopulmonary complications, morbidity, mortality, among others, and they were willing to proceed.   Eulas PostLANDAU,Tyran Huser P, MD Cell [phone]   02/27/2013 10:47 AM

## 2013-02-27 NOTE — Anesthesia Preprocedure Evaluation (Signed)
Anesthesia Evaluation  Patient identified by MRN, date of birth, ID band Patient awake    Reviewed: Allergy & Precautions, H&P , NPO status , Patient's Chart, lab work & pertinent test results  Airway Mallampati: I TM Distance: >3 FB Neck ROM: Full    Dental   Pulmonary former smoker,          Cardiovascular hypertension, Pt. on medications     Neuro/Psych    GI/Hepatic GERD-  Medicated and Controlled,  Endo/Other  diabetes, Well Controlled, Type 2, Oral Hypoglycemic Agents  Renal/GU      Musculoskeletal   Abdominal   Peds  Hematology   Anesthesia Other Findings   Reproductive/Obstetrics                           Anesthesia Physical Anesthesia Plan  ASA: II  Anesthesia Plan: General   Post-op Pain Management:    Induction: Intravenous  Airway Management Planned: LMA  Additional Equipment:   Intra-op Plan:   Post-operative Plan: Extubation in OR  Informed Consent: I have reviewed the patients History and Physical, chart, labs and discussed the procedure including the risks, benefits and alternatives for the proposed anesthesia with the patient or authorized representative who has indicated his/her understanding and acceptance.     Plan Discussed with: CRNA and Surgeon  Anesthesia Plan Comments:         Anesthesia Quick Evaluation

## 2013-02-27 NOTE — Progress Notes (Signed)
Spoke with Cheri at Dr. Shelba FlakeLandau's office re: stress test, she stated she would fax stress test as soon as she receives it.  She also stated surgery today is depending on getting stress test.

## 2013-02-28 ENCOUNTER — Other Ambulatory Visit: Payer: Self-pay | Admitting: Orthopedic Surgery

## 2013-02-28 LAB — CBC
HCT: 31.9 % — ABNORMAL LOW (ref 39.0–52.0)
Hemoglobin: 10.3 g/dL — ABNORMAL LOW (ref 13.0–17.0)
MCH: 28 pg (ref 26.0–34.0)
MCHC: 32.3 g/dL (ref 30.0–36.0)
MCV: 86.7 fL (ref 78.0–100.0)
PLATELETS: 160 10*3/uL (ref 150–400)
RBC: 3.68 MIL/uL — ABNORMAL LOW (ref 4.22–5.81)
RDW: 14.7 % (ref 11.5–15.5)
WBC: 8.1 10*3/uL (ref 4.0–10.5)

## 2013-02-28 LAB — BASIC METABOLIC PANEL
BUN: 22 mg/dL (ref 6–23)
CALCIUM: 7.9 mg/dL — AB (ref 8.4–10.5)
CO2: 25 mEq/L (ref 19–32)
CREATININE: 1.23 mg/dL (ref 0.50–1.35)
Chloride: 99 mEq/L (ref 96–112)
GFR calc non Af Amer: 60 mL/min — ABNORMAL LOW (ref >90)
GFR, EST AFRICAN AMERICAN: 69 mL/min — AB (ref >90)
Glucose, Bld: 135 mg/dL — ABNORMAL HIGH (ref 70–99)
Potassium: 3.9 mEq/L (ref 3.7–5.3)
Sodium: 136 mEq/L — ABNORMAL LOW (ref 137–147)

## 2013-02-28 LAB — GLUCOSE, CAPILLARY
GLUCOSE-CAPILLARY: 129 mg/dL — AB (ref 70–99)
GLUCOSE-CAPILLARY: 208 mg/dL — AB (ref 70–99)
GLUCOSE-CAPILLARY: 78 mg/dL (ref 70–99)

## 2013-02-28 NOTE — Progress Notes (Signed)
Patient ID: Terry Stevens, male   DOB: [birth year], 68 y.o.   MRN: [id number]     Subjective:  Patient reports pain as mild.  Patient sitting on the side of the bed and states that he is not having any pain at this time.  Very pleased with progress.  He denies any CP or SOB  Objective:   VITALS:   Filed Vitals:   02/27/13 1629 02/27/13 2000 02/27/13 2143 02/28/13 0436  BP: 152/82  120/66 122/66  Pulse: 92  84 78  Temp: 98.3 F (36.8 C)  98.4 F (36.9 C) 98.1 F (36.7 C)  TempSrc:   Oral Oral  Resp: 18 18 18 18   SpO2: 100% 99% 99% 99%    ABD soft Sensation intact distally Dorsiflexion/Plantar flexion intact Incision: dressing C/D/I and no drainage Patient hs full ankle ROM and knee motion of 0-70 with no pain   Lab Results  Component Value Date   WBC 8.1 02/28/2013   HGB 10.3* 02/28/2013   HCT 31.9* 02/28/2013   MCV 86.7 02/28/2013   PLT 160 02/28/2013     Assessment/Plan: 1 Day Post-Op   Principal Problem:   Osteoarthritis of left knee Active Problems:   Knee osteoarthritis   Advance diet Up with therapy WBAT okay to DC knee immobilizer Plan for DC tomorrow but could go today if passes PT   DOUGLAS PARRY, BRANDON 02/28/2013, 7:05 AM  Discussed and agree.  Possible dc home today if capable.  [name], MD Cell [phone]

## 2013-02-28 NOTE — Evaluation (Signed)
Occupational Therapy Evaluation  Patient Details Name: Terry Stevens MRN: [id number] DOB: 1945/12/12 Today's Date: 02/28/2013 Time: [id number] OT Time Calculation (min): 16 min  OT Assessment / Plan / Recommendation History of present illness s/p L TKA   Clinical Impression   Pt admitted with above.  Pt is demonstrating functional mobility and transfers at supervision-min guard level.  Will require min assist initially with LB ADLs but has excellent support system at home to provide necessary level of assist. No further acute OT services needed. Will sign off.    OT Assessment  Patient does not need any further OT services    Follow Up Recommendations  No OT follow up;Supervision/Assistance - 24 hour    Barriers to Discharge      Equipment Recommendations  3 in 1 bedside comode    Recommendations for Other Services    Frequency       Precautions / Restrictions Precautions Precautions: Knee Required Braces or Orthoses: Knee Immobilizer - Left Restrictions Weight Bearing Restrictions: Yes LLE Weight Bearing: Weight bearing as tolerated   Pertinent Vitals/Pain Pt c/o nausea. RN made aware.    ADL  Eating/Feeding: Performed;Independent Where Assessed - Eating/Feeding: Chair Lower Body Dressing: Simulated;Minimal assistance Where Assessed - Lower Body Dressing: Unsupported sit to stand Toilet Transfer: Simulated;Min guard Toilet Transfer Method: Sit to Barist[place]and Toilet Transfer Equipment:  (bed, chair) Tub/Shower Transfer: Simulated;Min guard Tub/Shower Transfer Method: Science writerAmbulating Tub/Shower Transfer Equipment: Walk in shower Equipment Used: Knee Immobilizer;Rolling walker Transfers/Ambulation Related to ADLs: supervision ambulating with RW  ADL Comments: Educated pt and wife on shower transfer technique and LB dressing technique.  Pt able to demonstrate walk in shower transfer (simulated with one step) at min guard level- min guard for safety only.  Educated pt and wife on use  of 3n1 as shower chair as well as for over toilet.    OT Diagnosis:    OT Problem List:   OT Treatment Interventions:     OT Goals(Current goals can be found in the care plan section)    Visit Information  Last OT Received On: 02/28/13 Assistance Needed: +1 History of Present Illness: s/p L TKA       Prior Functioning     Home Living Family/patient expects to be discharged to:: Private residence Living Arrangements: Spouse/significant other Available Help at Discharge: Family Type of Home: House Home Access: Ramped entrance Home Layout: One level Home Equipment: None Prior Function Level of Independence: Independent         Vision/Perception     Cognition  Cognition Arousal/Alertness: Awake/alert Behavior During Therapy: WFL for tasks assessed/performed Overall Cognitive Status: Within Functional Limits for tasks assessed    Extremity/Trunk Assessment Upper Extremity Assessment Upper Extremity Assessment: Overall WFL for tasks assessed     Mobility Bed Mobility Overal bed mobility: Modified Independent General bed mobility comments: Incr time to complete bed mobility. Transfers Overall transfer level: Needs assistance Equipment used: Rolling walker (2 wheeled) Transfers: Sit to/from Stand Sit to Stand: Min guard General transfer comment: Min guard for safety. VC's for safe hand placement and technique.     Exercise     Balance     End of Session OT - End of Session Equipment Utilized During Treatment: Rolling walker;Left knee immobilizer Activity Tolerance: Patient tolerated treatment well Patient left: in chair;with call bell/phone within reach;with family/visitor present (with PT) Nurse Communication: Mobility status (pt c/o nausea)  GO    02/28/2013 Cipriano MileJohnson, Terry Stevens Pager [phone] Office [phone]  Smitty PluckJohnson, Terry  Elizabeth 02/28/2013, 10:10 AM

## 2013-02-28 NOTE — Discharge Summary (Signed)
Physician Discharge Summary  Patient ID: Terry Stevens MRN: [id number] DOB/AGE: 66-Jul-1947 66 y.o.  Admit date: 02/27/2013 Discharge date: 02/28/2013  Admission Diagnoses:  Osteoarthritis of left knee  Discharge Diagnoses:  Principal Problem:   Osteoarthritis of left knee Active Problems:   Knee osteoarthritis   Past Medical History  Diagnosis Date  . Hypertension   . Gout   . Diabetes mellitus without complication   . Constipation   . GERD (gastroesophageal reflux disease)     tums prn  . Arthritis   . Osteoarthritis of left knee 02/27/2013    Surgeries: Procedure(s): TOTAL KNEE ARTHROPLASTY on 02/27/2013   Consultants (if any):    Discharged Condition: Improved  Hospital Course: Terry Stevens is an 68 y.o. male who was admitted 02/27/2013 with a diagnosis of Osteoarthritis of left knee and went to the operating room on 02/27/2013 and underwent the above named procedures.    He was given perioperative antibiotics:  Anti-infectives   Start     Dose/Rate Route Frequency Ordered Stop   02/27/13 1700  ceFAZolin (ANCEF) IVPB 2 g/50 mL premix     2 g 100 mL/hr over 30 Minutes Intravenous Every 6 hours 02/27/13 1627 02/27/13 2320   02/27/13 0900  ceFAZolin (ANCEF) IVPB 2 g/50 mL premix     2 g 100 mL/hr over 30 Minutes Intravenous On call to O.R. 02/27/13 7846 02/27/13 1110    .  He was given sequential compression devices, early ambulation, and lovenox for DVT prophylaxis.  He benefited maximally from the hospital stay and there were no complications.    Recent vital signs:  Filed Vitals:   02/28/13 0925  BP: 168/50  Pulse: 84  Temp:   Resp:     Recent laboratory studies:  Lab Results  Component Value Date   HGB 10.3* 02/28/2013   HGB 11.8* 02/27/2013   HGB 13.0 02/20/2013   Lab Results  Component Value Date   WBC 8.1 02/28/2013   PLT 160 02/28/2013   Lab Results  Component Value Date   INR 1.01 02/20/2013   Lab Results  Component Value Date   NA 136*  02/28/2013   K 3.9 02/28/2013   CL 99 02/28/2013   CO2 25 02/28/2013   BUN 22 02/28/2013   CREATININE 1.23 02/28/2013   GLUCOSE 135* 02/28/2013    Discharge Medications:     Medication List    STOP taking these medications       naproxen sodium 220 MG tablet  Commonly known as:  ANAPROX      TAKE these medications       allopurinol 300 MG tablet  Commonly known as:  ZYLOPRIM  Take 300 mg by mouth daily.     aspirin 81 MG tablet  Take 81 mg by mouth daily.     atorvastatin 20 MG tablet  Commonly known as:  LIPITOR  Take 20 mg by mouth daily.     enoxaparin 30 MG/0.3ML injection  Commonly known as:  LOVENOX  Inject 0.3 mLs (30 mg total) into the skin every 12 (twelve) hours.     glimepiride 4 MG tablet  Commonly known as:  AMARYL  Take 4 mg by mouth daily with breakfast.     hydrochlorothiazide 25 MG tablet  Commonly known as:  HYDRODIURIL  Take 25 mg by mouth daily.     losartan 50 MG tablet  Commonly known as:  COZAAR  Take 50 mg by mouth daily.     metFORMIN 500 MG  tablet  Commonly known as:  GLUCOPHAGE  Take 500 mg by mouth 2 (two) times daily with a meal.     methocarbamol 500 MG tablet  Commonly known as:  ROBAXIN  Take 1 tablet (500 mg total) by mouth 4 (four) times daily.     oxyCODONE-acetaminophen 10-325 MG per tablet  Commonly known as:  PERCOCET  Take 1-2 tablets by mouth every 6 (six) hours as needed for pain. MAXIMUM TOTAL ACETAMINOPHEN DOSE IS 4000 MG PER DAY     promethazine 25 MG tablet  Commonly known as:  PHENERGAN  Take 1 tablet (25 mg total) by mouth every 6 (six) hours as needed for nausea or vomiting.     sennosides-docusate sodium 8.6-50 MG tablet  Commonly known as:  SENOKOT-S  Take 2 tablets by mouth daily.        Diagnostic Studies: Dg Chest 2 View  02/20/2013   CLINICAL DATA:  Preoperative evaluation for knee replacement, history hypertension, diabetes, gout  EXAM: CHEST  2 VIEW  COMPARISON:  08/13/2004  FINDINGS: Mild  enlargement of cardiac silhouette.  Mild tortuous thoracic aorta.  Mediastinal contours and pulmonary vascularity otherwise normal.  Lungs clear.  No pleural effusion or pneumothorax.  No acute osseous findings.  IMPRESSION: No acute abnormalities.   Electronically Signed   By: Ulyses SouthwardMark  Boles M.D.   On: 02/20/2013 16:24   Dg Knee Left Port  02/27/2013   CLINICAL DATA:  Postop left knee arthroplasty.  EXAM: PORTABLE LEFT KNEE - 1-2 VIEW  COMPARISON:  None.  FINDINGS: The prosthetic components are well-seated and aligned. There is no acute fracture or evidence of an operative complication.  IMPRESSION: Well-positioned left knee prosthesis.   Electronically Signed   By: Amie Portlandavid  Ormond M.D.   On: 02/27/2013 14:33    Disposition: Final discharge disposition not confirmed      Discharge Orders   Future Orders Complete By Expires   Call MD / Call 911  As directed    Comments:     If you experience chest pain or shortness of breath, CALL 911 and be transported to the hospital emergency room.  If you develope a fever above 101 F, pus (white drainage) or increased drainage or redness at the wound, or calf pain, call your surgeon's office.   Change dressing  As directed    Comments:     Change dressing in three days, then change the dressing daily with sterile 4 x 4 inch gauze dressing.  You may clean the incision with alcohol prior to redressing.   Constipation Prevention  As directed    Comments:     Drink plenty of fluids.  Prune juice may be helpful.  You may use a stool softener, such as Colace (over the counter) 100 mg twice a day.  Use MiraLax (over the counter) for constipation as needed.   Diet general  As directed    Discharge instructions  As directed    Comments:     Change dressing in 3 days and reapply fresh dressing, unless you have a splint (half cast).  If you have a splint/cast, just leave in place until your follow-up appointment.    Keep wounds dry for 3 weeks.  Leave steri-strips in  place on skin.  Do not apply lotion or anything to the wound.   Do not put a pillow under the knee. Place it under the heel.  As directed    TED hose  As directed    Comments:  Use stockings (TED hose) for 2 weeks on both leg(s).  You may remove them at night for sleeping.   Weight bearing as tolerated  As directed    Questions:     Laterality:     Extremity:        Follow-up Information   Follow up with Eulas Post, MD. Schedule an appointment as soon as possible for a visit in 2 weeks.   Specialty:  Orthopedic Surgery   Contact information:   [address] [place] [address] [phone]        Signed: Eulas Post 02/28/2013, 10:18 AM

## 2013-02-28 NOTE — Progress Notes (Signed)
Utilization review completed.  

## 2013-02-28 NOTE — Progress Notes (Signed)
Patient d/c to home.  IV removed.  Prescriptions given and instructions reviewed.

## 2013-02-28 NOTE — Progress Notes (Signed)
Physical Therapy Treatment Note  Excellent progress; no knee buckling noted; OK for dc home from PT standpoint  Did not rate, but states it's worse than this am   02/28/13 1600  PT Visit Information  Last PT Received On 02/28/13  Assistance Needed +1  History of Present Illness s/p L TKA  PT Time Calculation  PT Start Time 1548  PT Stop Time 1602  PT Time Calculation (min) 14 min  Subjective Data  Subjective Feeling better; not nauseated; wants to go home  Patient Stated Goal to walk without pain  Precautions  Precautions Knee  Required Braces or Orthoses Knee Immobilizer - Left  Restrictions  LLE Weight Bearing WBAT  Cognition  Arousal/Alertness Awake/alert  Behavior During Therapy WFL for tasks assessed/performed  Overall Cognitive Status Within Functional Limits for tasks assessed  Bed Mobility  Overal bed mobility Modified Independent  General bed mobility comments Incr time to complete bed mobility.  Transfers  Overall transfer level Needs assistance  Equipment used Rolling walker (2 wheeled)  Transfers Sit to/from Stand  Sit to Stand Supervision  General transfer comment Safe technique  Ambulation/Gait  Ambulation/Gait assistance Supervision  Ambulation Distance (Feet) 120 Feet  Assistive device Rolling walker (2 wheeled)  Gait Pattern/deviations Step-through pattern  General Gait Details More stable gait; no buckling noted  PT - End of Session  Equipment Utilized During Treatment Left knee immobilizer  Activity Tolerance Patient tolerated treatment well  Patient left in chair;with call bell/phone within reach  Nurse Communication Mobility status  PT - Assessment/Plan  PT Plan Current plan remains appropriate  PT Frequency 7X/week  Follow Up Recommendations Home health PT  PT equipment Rolling walker with 5" wheels;3in1 (PT)  PT Goal Progression  Progress towards PT goals Progressing toward goals  Acute Rehab PT Goals  PT Goal Formulation With patient   Time For Goal Achievement 03/07/13  Potential to Achieve Goals Good  PT General Charges  $$ ACUTE PT VISIT 1 Procedure  PT Treatments  $Gait Training 8-22 mins   SherrillHolly Sherin Mur[place], [place] [phone]

## 2013-02-28 NOTE — Care Management Note (Signed)
CARE MANAGEMENT NOTE 02/28/2013  Patient:  Purcell NailsDALTON,Jurell   Account Number:  [id number]  Date Initiated:  02/28/2013  Documentation initiated by:  Vance PeperBRADY,Ambers Iyengar  Subjective/Objective Assessment:   68 yr old male s/p left total knee arthroplasty.     Action/Plan:   Case Manager spoke with patient concerning home health and DME needs at discharge. Choice offered. CM called [facility] DuraMary Hickling,[facility]. Patient has family support at discharge.   Anticipated DC Date:  02/28/2013   Anticipated DC Plan:  HOME W HOME HEALTH SERVICES      DC Planning Services  CM consult      [facility]  [facility]  DURABLE MEDICAL EQUIPMENT   Choice offered to / List presented to:  C-1 Patient   DME arranged  [facility]  3-N-1      DME agency  [employer]     HH arranged  HH-2 PT      HH agency  [facility]   Status of service:  Completed, signed off Medicare Important Message given?   (If response is "NO", the following Medicare IM given date fields will be blank) Date Medicare IM given:   Date Additional Medicare IM given:    Discharge Disposition:  HOME W HOME HEALTH SERVICES  Per UR Regulation:    If discussed at Long Length of Stay Meetings, dates discussed:    Comments:

## 2013-02-28 NOTE — Evaluation (Signed)
Physical Therapy Evaluation Patient Details Name: Terry Stevens MRN: [id number]  DOB: 1945-05-16 Today's Date: 02/28/2013 Time: [id number] PT Time Calculation (min): 31 min  PT Assessment / Plan / Recommendation History of Present Illness  s/p L TKA  Clinical Impression  Pt is s/p TKA resulting in the deficits listed below (see PT Problem List).  Pt will benefit from skilled PT to increase their independence and safety with mobility to allow discharge to the venue listed below.      PT Assessment  Patient needs continued PT services    Follow Up Recommendations  Home health PT    Does the patient have the potential to tolerate intense rehabilitation      Barriers to Discharge        Equipment Recommendations  Rolling walker with 5" wheels;3in1 (PT)    Recommendations for Other Services     Frequency 7X/week    Precautions / Restrictions Precautions Precautions: Knee Required Braces or Orthoses: Knee Immobilizer - Left Restrictions Weight Bearing Restrictions: Yes LLE Weight Bearing: Weight bearing as tolerated   Pertinent Vitals/Pain No specific reports of pain; Just nausea, and RN gave meds      Mobility  Bed Mobility Overal bed mobility: Modified Independent General bed mobility comments: Incr time to complete bed mobility. Transfers Overall transfer level: Needs assistance Equipment used: Rolling walker (2 wheeled) Transfers: Sit to/from Stand Sit to Stand: Min guard General transfer comment: Min guard for safety. VC's for safe hand placement and technique. Ambulation/Gait Ambulation/Gait assistance: Min guard Ambulation Distance (Feet): 140 Feet Assistive device: Rolling walker (2 wheeled) Gait Pattern/deviations: Step-to pattern Stairs: Yes Stairs assistance: Min guard Stair Management: No rails;With walker;Backwards Number of Stairs: 1 (simulate curb/shower) General stair comments: Cues for sequence; minguard for safety    Exercises     PT  Diagnosis: Difficulty walking;Acute pain  PT Problem List: Decreased strength;Decreased range of motion;Decreased activity tolerance;Decreased balance;Decreased mobility;Decreased knowledge of use of DME;Pain PT Treatment Interventions: DME instruction;Gait training;Stair training;Functional mobility training;Therapeutic activities;Therapeutic exercise;Patient/family education     PT Goals(Current goals can be found in the care plan section) Acute Rehab PT Goals Patient Stated Goal: to walk without pain PT Goal Formulation: With patient Time For Goal Achievement: 03/07/13 Potential to Achieve Goals: Good  Visit Information  Last PT Received On: 02/28/13 Assistance Needed: +1 History of Present Illness: s/p L TKA       Prior Functioning  Home Living Family/patient expects to be discharged to:: Private residence Living Arrangements: Spouse/significant other Available Help at Discharge: Family Type of Home: House Home Access: Ramped entrance Home Layout: One level Home Equipment: None Prior Function Level of Independence: Independent    Cognition  Cognition Arousal/Alertness: Awake/alert Behavior During Therapy: WFL for tasks assessed/performed Overall Cognitive Status: Within Functional Limits for tasks assessed    Extremity/Trunk Assessment Upper Extremity Assessment Upper Extremity Assessment: Overall WFL for tasks assessed Lower Extremity Assessment Lower Extremity Assessment: LLE deficits/detail LLE Deficits / Details: Grossly dec AROM and strenght postop   Balance Balance Overall balance assessment: No apparent balance deficits (not formally assessed)  End of Session PT - End of Session Equipment Utilized During Treatment: Gait belt;Left knee immobilizer Activity Tolerance: Patient tolerated treatment well Patient left: in chair;with call bell/phone within reach;with family/visitor present Nurse Communication: Mobility status  GP     Olen PelGarrigan, Kaijah Abts Hamff MorrowHolly  Dorethy Tomey, [place] [phone]  02/28/2013, 12:14 PM

## 2013-03-01 ENCOUNTER — Encounter (HOSPITAL_COMMUNITY): Payer: Self-pay | Admitting: Orthopedic Surgery

## 2015-03-27 DIAGNOSIS — Z6831 Body mass index (BMI) 31.0-31.9, adult: Secondary | ICD-10-CM | POA: Diagnosis not present

## 2015-03-27 DIAGNOSIS — I10 Essential (primary) hypertension: Secondary | ICD-10-CM | POA: Diagnosis not present

## 2015-03-27 DIAGNOSIS — Z1389 Encounter for screening for other disorder: Secondary | ICD-10-CM | POA: Diagnosis not present

## 2015-03-27 DIAGNOSIS — Z0000 Encounter for general adult medical examination without abnormal findings: Secondary | ICD-10-CM | POA: Diagnosis not present

## 2015-03-27 DIAGNOSIS — E1165 Type 2 diabetes mellitus with hyperglycemia: Secondary | ICD-10-CM | POA: Diagnosis not present

## 2015-04-07 DIAGNOSIS — R109 Unspecified abdominal pain: Secondary | ICD-10-CM | POA: Diagnosis not present

## 2015-04-07 DIAGNOSIS — R1084 Generalized abdominal pain: Secondary | ICD-10-CM | POA: Diagnosis not present

## 2015-04-07 DIAGNOSIS — N281 Cyst of kidney, acquired: Secondary | ICD-10-CM | POA: Diagnosis not present

## 2015-04-17 IMAGING — CR DG CHEST 2V
2 series · 2 of 2 positions shown · non-contrast
Comparison: 08/13/2004

CLINICAL DATA: Preoperative evaluation for knee replacement,
history hypertension, diabetes, gout

EXAM:
CHEST  2 VIEW

[w chest pa]
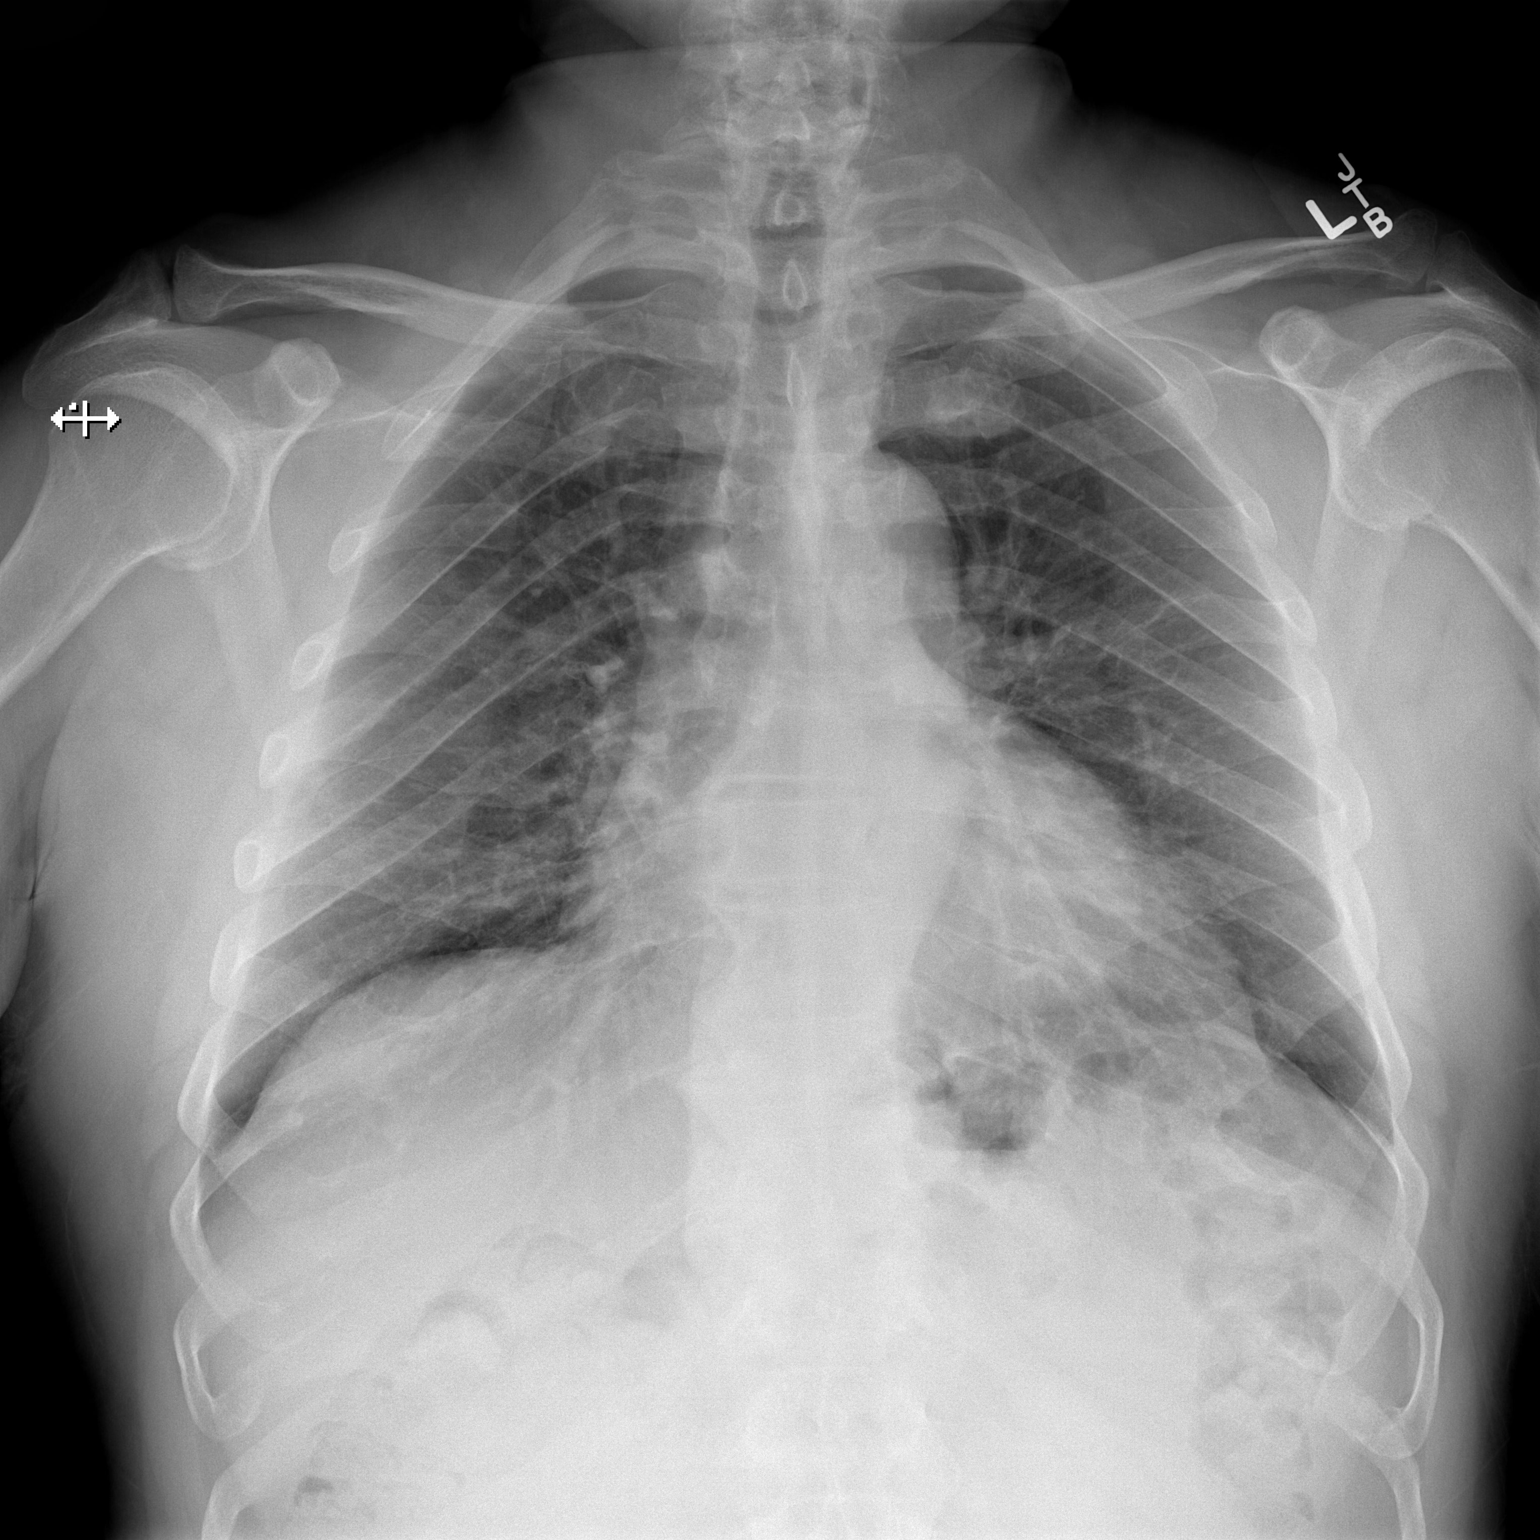

[w chest lat]
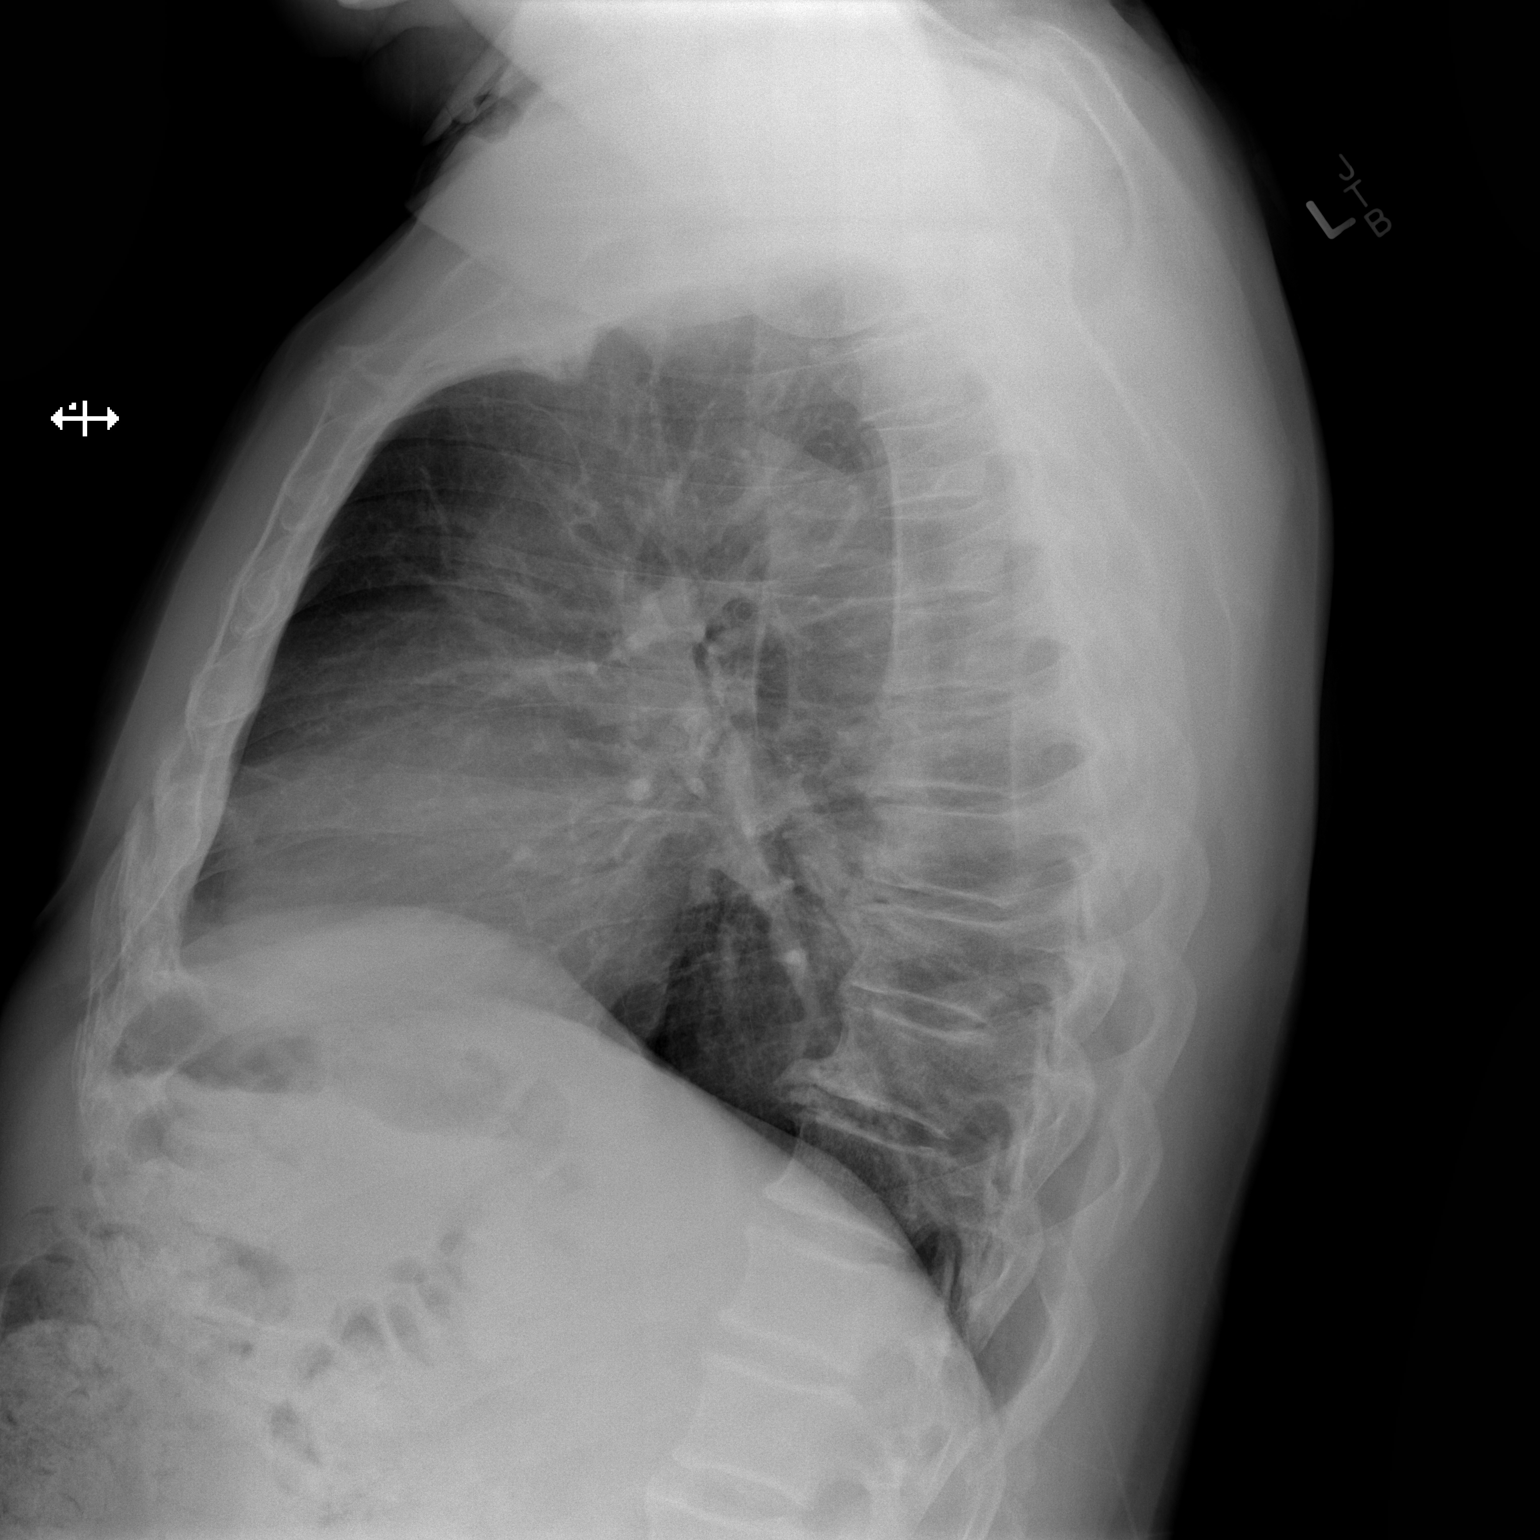

[2 of 2 positions shown; findings below may reference images not displayed]

FINDINGS: Mild enlargement of cardiac silhouette.

Mild tortuous thoracic aorta.

Mediastinal contours and pulmonary vascularity otherwise normal.

Lungs clear.

No pleural effusion or pneumothorax.

No acute osseous findings.
IMPRESSION: No acute abnormalities.

## 2015-04-23 DIAGNOSIS — Z7984 Long term (current) use of oral hypoglycemic drugs: Secondary | ICD-10-CM | POA: Diagnosis not present

## 2015-04-23 DIAGNOSIS — M810 Age-related osteoporosis without current pathological fracture: Secondary | ICD-10-CM | POA: Diagnosis not present

## 2015-04-23 DIAGNOSIS — E7800 Pure hypercholesterolemia, unspecified: Secondary | ICD-10-CM | POA: Diagnosis not present

## 2015-04-23 DIAGNOSIS — Z96651 Presence of right artificial knee joint: Secondary | ICD-10-CM | POA: Diagnosis not present

## 2015-04-23 DIAGNOSIS — M1990 Unspecified osteoarthritis, unspecified site: Secondary | ICD-10-CM | POA: Diagnosis not present

## 2015-04-23 DIAGNOSIS — Z13820 Encounter for screening for osteoporosis: Secondary | ICD-10-CM | POA: Diagnosis not present

## 2015-04-23 DIAGNOSIS — M109 Gout, unspecified: Secondary | ICD-10-CM | POA: Diagnosis not present

## 2015-04-23 DIAGNOSIS — Z79899 Other long term (current) drug therapy: Secondary | ICD-10-CM | POA: Diagnosis not present

## 2015-04-23 DIAGNOSIS — I10 Essential (primary) hypertension: Secondary | ICD-10-CM | POA: Diagnosis not present

## 2015-04-23 DIAGNOSIS — Z7982 Long term (current) use of aspirin: Secondary | ICD-10-CM | POA: Diagnosis not present

## 2015-04-23 DIAGNOSIS — E119 Type 2 diabetes mellitus without complications: Secondary | ICD-10-CM | POA: Diagnosis not present

## 2015-06-26 DIAGNOSIS — E1165 Type 2 diabetes mellitus with hyperglycemia: Secondary | ICD-10-CM | POA: Diagnosis not present

## 2015-06-26 DIAGNOSIS — M1049 Other secondary gout, multiple sites: Secondary | ICD-10-CM | POA: Diagnosis not present

## 2015-06-26 DIAGNOSIS — Z6831 Body mass index (BMI) 31.0-31.9, adult: Secondary | ICD-10-CM | POA: Diagnosis not present

## 2015-06-26 DIAGNOSIS — I10 Essential (primary) hypertension: Secondary | ICD-10-CM | POA: Diagnosis not present

## 2015-07-03 DIAGNOSIS — Z96652 Presence of left artificial knee joint: Secondary | ICD-10-CM | POA: Diagnosis not present

## 2015-07-03 DIAGNOSIS — Z1211 Encounter for screening for malignant neoplasm of colon: Secondary | ICD-10-CM | POA: Diagnosis not present

## 2015-07-03 DIAGNOSIS — M109 Gout, unspecified: Secondary | ICD-10-CM | POA: Diagnosis not present

## 2015-07-03 DIAGNOSIS — K5730 Diverticulosis of large intestine without perforation or abscess without bleeding: Secondary | ICD-10-CM | POA: Diagnosis not present

## 2015-07-03 DIAGNOSIS — Z79899 Other long term (current) drug therapy: Secondary | ICD-10-CM | POA: Diagnosis not present

## 2015-07-03 DIAGNOSIS — Z7984 Long term (current) use of oral hypoglycemic drugs: Secondary | ICD-10-CM | POA: Diagnosis not present

## 2015-07-03 DIAGNOSIS — K640 First degree hemorrhoids: Secondary | ICD-10-CM | POA: Diagnosis not present

## 2015-07-03 DIAGNOSIS — Z7982 Long term (current) use of aspirin: Secondary | ICD-10-CM | POA: Diagnosis not present

## 2015-07-03 DIAGNOSIS — I10 Essential (primary) hypertension: Secondary | ICD-10-CM | POA: Diagnosis not present

## 2015-07-03 DIAGNOSIS — E119 Type 2 diabetes mellitus without complications: Secondary | ICD-10-CM | POA: Diagnosis not present

## 2015-07-03 DIAGNOSIS — E785 Hyperlipidemia, unspecified: Secondary | ICD-10-CM | POA: Diagnosis not present

## 2015-09-26 DIAGNOSIS — M1049 Other secondary gout, multiple sites: Secondary | ICD-10-CM | POA: Diagnosis not present

## 2015-09-26 DIAGNOSIS — Z125 Encounter for screening for malignant neoplasm of prostate: Secondary | ICD-10-CM | POA: Diagnosis not present

## 2015-09-26 DIAGNOSIS — Z6831 Body mass index (BMI) 31.0-31.9, adult: Secondary | ICD-10-CM | POA: Diagnosis not present

## 2015-09-26 DIAGNOSIS — I10 Essential (primary) hypertension: Secondary | ICD-10-CM | POA: Diagnosis not present

## 2015-09-26 DIAGNOSIS — E1165 Type 2 diabetes mellitus with hyperglycemia: Secondary | ICD-10-CM | POA: Diagnosis not present

## 2015-09-27 ENCOUNTER — Emergency Department (HOSPITAL_COMMUNITY): Payer: Medicare HMO

## 2015-09-27 ENCOUNTER — Inpatient Hospital Stay (HOSPITAL_COMMUNITY)
Admission: EM | Admit: 2015-09-27 | Discharge: 2015-09-30 | DRG: 500 | Disposition: A | Discharge status: Home / Self Care | Payer: Medicare HMO | Attending: Surgery | Admitting: Surgery

## 2015-09-27 ENCOUNTER — Encounter (HOSPITAL_COMMUNITY): Payer: Self-pay

## 2015-09-27 DIAGNOSIS — S0181XA Laceration without foreign body of other part of head, initial encounter: Secondary | ICD-10-CM

## 2015-09-27 DIAGNOSIS — R52 Pain, unspecified: Secondary | ICD-10-CM

## 2015-09-27 DIAGNOSIS — S0990XA Unspecified injury of head, initial encounter: Secondary | ICD-10-CM | POA: Diagnosis not present

## 2015-09-27 DIAGNOSIS — R0682 Tachypnea, not elsewhere classified: Secondary | ICD-10-CM | POA: Diagnosis not present

## 2015-09-27 DIAGNOSIS — K219 Gastro-esophageal reflux disease without esophagitis: Secondary | ICD-10-CM | POA: Diagnosis present

## 2015-09-27 DIAGNOSIS — R0781 Pleurodynia: Secondary | ICD-10-CM | POA: Diagnosis not present

## 2015-09-27 DIAGNOSIS — Z87891 Personal history of nicotine dependence: Secondary | ICD-10-CM | POA: Diagnosis not present

## 2015-09-27 DIAGNOSIS — Z7982 Long term (current) use of aspirin: Secondary | ICD-10-CM | POA: Diagnosis not present

## 2015-09-27 DIAGNOSIS — S42112A Displaced fracture of body of scapula, left shoulder, initial encounter for closed fracture: Principal | ICD-10-CM | POA: Diagnosis present

## 2015-09-27 DIAGNOSIS — M109 Gout, unspecified: Secondary | ICD-10-CM | POA: Diagnosis present

## 2015-09-27 DIAGNOSIS — S0993XA Unspecified injury of face, initial encounter: Secondary | ICD-10-CM | POA: Diagnosis not present

## 2015-09-27 DIAGNOSIS — S3993XA Unspecified injury of pelvis, initial encounter: Secondary | ICD-10-CM | POA: Diagnosis not present

## 2015-09-27 DIAGNOSIS — M25422 Effusion, left elbow: Secondary | ICD-10-CM | POA: Diagnosis not present

## 2015-09-27 DIAGNOSIS — M25559 Pain in unspecified hip: Secondary | ICD-10-CM | POA: Diagnosis not present

## 2015-09-27 DIAGNOSIS — Z23 Encounter for immunization: Secondary | ICD-10-CM

## 2015-09-27 DIAGNOSIS — Z79899 Other long term (current) drug therapy: Secondary | ICD-10-CM | POA: Diagnosis not present

## 2015-09-27 DIAGNOSIS — S01511A Laceration without foreign body of lip, initial encounter: Secondary | ICD-10-CM | POA: Diagnosis present

## 2015-09-27 DIAGNOSIS — S4992XA Unspecified injury of left shoulder and upper arm, initial encounter: Secondary | ICD-10-CM | POA: Diagnosis not present

## 2015-09-27 DIAGNOSIS — M542 Cervicalgia: Secondary | ICD-10-CM | POA: Diagnosis not present

## 2015-09-27 DIAGNOSIS — S225XXA Flail chest, initial encounter for closed fracture: Secondary | ICD-10-CM | POA: Diagnosis present

## 2015-09-27 DIAGNOSIS — I10 Essential (primary) hypertension: Secondary | ICD-10-CM | POA: Diagnosis present

## 2015-09-27 DIAGNOSIS — Z96652 Presence of left artificial knee joint: Secondary | ICD-10-CM | POA: Diagnosis present

## 2015-09-27 DIAGNOSIS — T148 Other injury of unspecified body region: Secondary | ICD-10-CM | POA: Diagnosis not present

## 2015-09-27 DIAGNOSIS — S42102A Fracture of unspecified part of scapula, left shoulder, initial encounter for closed fracture: Secondary | ICD-10-CM

## 2015-09-27 DIAGNOSIS — S199XXA Unspecified injury of neck, initial encounter: Secondary | ICD-10-CM | POA: Diagnosis not present

## 2015-09-27 DIAGNOSIS — S2242XA Multiple fractures of ribs, left side, initial encounter for closed fracture: Secondary | ICD-10-CM | POA: Diagnosis not present

## 2015-09-27 DIAGNOSIS — D62 Acute posthemorrhagic anemia: Secondary | ICD-10-CM | POA: Diagnosis present

## 2015-09-27 DIAGNOSIS — S01501A Unspecified open wound of lip, initial encounter: Secondary | ICD-10-CM | POA: Diagnosis not present

## 2015-09-27 DIAGNOSIS — S3991XA Unspecified injury of abdomen, initial encounter: Secondary | ICD-10-CM | POA: Diagnosis not present

## 2015-09-27 DIAGNOSIS — E119 Type 2 diabetes mellitus without complications: Secondary | ICD-10-CM | POA: Diagnosis present

## 2015-09-27 DIAGNOSIS — Z7984 Long term (current) use of oral hypoglycemic drugs: Secondary | ICD-10-CM

## 2015-09-27 DIAGNOSIS — M25512 Pain in left shoulder: Secondary | ICD-10-CM | POA: Diagnosis not present

## 2015-09-27 DIAGNOSIS — M79602 Pain in left arm: Secondary | ICD-10-CM | POA: Diagnosis not present

## 2015-09-27 DIAGNOSIS — S299XXA Unspecified injury of thorax, initial encounter: Secondary | ICD-10-CM | POA: Diagnosis not present

## 2015-09-27 DIAGNOSIS — R51 Headache: Secondary | ICD-10-CM | POA: Diagnosis not present

## 2015-09-27 LAB — CBC WITH DIFFERENTIAL/PLATELET
Basophils Absolute: 0 10*3/uL (ref 0.0–0.1)
Basophils Relative: 0 %
Eosinophils Absolute: 0 10*3/uL (ref 0.0–0.7)
Eosinophils Relative: 0 %
HEMATOCRIT: 37.1 % — AB (ref 39.0–52.0)
Hemoglobin: 11.7 g/dL — ABNORMAL LOW (ref 13.0–17.0)
LYMPHS PCT: 18 %
Lymphs Abs: 1.6 10*3/uL (ref 0.7–4.0)
MCH: 27.3 pg (ref 26.0–34.0)
MCHC: 31.5 g/dL (ref 30.0–36.0)
MCV: 86.7 fL (ref 78.0–100.0)
MONO ABS: 0.5 10*3/uL (ref 0.1–1.0)
MONOS PCT: 5 %
NEUTROS ABS: 6.8 10*3/uL (ref 1.7–7.7)
Neutrophils Relative %: 77 %
Platelets: 188 10*3/uL (ref 150–400)
RBC: 4.28 MIL/uL (ref 4.22–5.81)
RDW: 15.5 % (ref 11.5–15.5)
WBC: 9 10*3/uL (ref 4.0–10.5)

## 2015-09-27 LAB — ETHANOL

## 2015-09-27 LAB — I-STAT CHEM 8, ED
BUN: 24 mg/dL — AB (ref 6–20)
CREATININE: 1.6 mg/dL — AB (ref 0.61–1.24)
Calcium, Ion: 1.16 mmol/L (ref 1.12–1.23)
Chloride: 104 mmol/L (ref 101–111)
Glucose, Bld: 181 mg/dL — ABNORMAL HIGH (ref 65–99)
HEMATOCRIT: 39 % (ref 39.0–52.0)
Hemoglobin: 13.3 g/dL (ref 13.0–17.0)
Potassium: 3.6 mmol/L (ref 3.5–5.1)
Sodium: 146 mmol/L — ABNORMAL HIGH (ref 135–145)
TCO2: 27 mmol/L (ref 0–100)

## 2015-09-27 LAB — COMPREHENSIVE METABOLIC PANEL
ALT: 17 U/L (ref 17–63)
ANION GAP: 5 (ref 5–15)
AST: 24 U/L (ref 15–41)
Albumin: 3.6 g/dL (ref 3.5–5.0)
Alkaline Phosphatase: 89 U/L (ref 38–126)
BILIRUBIN TOTAL: 0.6 mg/dL (ref 0.3–1.2)
BUN: 23 mg/dL — AB (ref 6–20)
CO2: 27 mmol/L (ref 22–32)
Calcium: 8.8 mg/dL — ABNORMAL LOW (ref 8.9–10.3)
Chloride: 109 mmol/L (ref 101–111)
Creatinine, Ser: 1.71 mg/dL — ABNORMAL HIGH (ref 0.61–1.24)
GFR, EST AFRICAN AMERICAN: 46 mL/min — AB (ref >60)
GFR, EST NON AFRICAN AMERICAN: 39 mL/min — AB (ref >60)
Glucose, Bld: 185 mg/dL — ABNORMAL HIGH (ref 65–99)
POTASSIUM: 3.5 mmol/L (ref 3.5–5.1)
Sodium: 141 mmol/L (ref 135–145)
TOTAL PROTEIN: 7.6 g/dL (ref 6.5–8.1)

## 2015-09-27 LAB — URINALYSIS, ROUTINE W REFLEX MICROSCOPIC
Bilirubin Urine: NEGATIVE
GLUCOSE, UA: NEGATIVE mg/dL
Hgb urine dipstick: NEGATIVE
LEUKOCYTES UA: NEGATIVE
NITRITE: NEGATIVE
PROTEIN: NEGATIVE mg/dL
Specific Gravity, Urine: 1.015 (ref 1.005–1.030)
pH: 6 (ref 5.0–8.0)

## 2015-09-27 LAB — PROTIME-INR
INR: 1.03
Prothrombin Time: 13.5 seconds (ref 11.4–15.2)

## 2015-09-27 LAB — I-STAT CG4 LACTIC ACID, ED: Lactic Acid, Venous: 0.97 mmol/L (ref 0.5–1.9)

## 2015-09-27 MED ORDER — IOPAMIDOL (ISOVUE-300) INJECTION 61%
100.0000 mL | Freq: Once | INTRAVENOUS | Status: AC | PRN
  Administered 2015-09-27: 100 mL via INTRAVENOUS

## 2015-09-27 MED ORDER — ONDANSETRON HCL 4 MG/2ML IJ SOLN
4.0000 mg | Freq: Once | INTRAMUSCULAR | Status: AC
  Administered 2015-09-27: 4 mg via INTRAVENOUS

## 2015-09-27 MED ORDER — MORPHINE SULFATE (PF) 4 MG/ML IV SOLN
4.0000 mg | Freq: Once | INTRAVENOUS | Status: AC
  Administered 2015-09-27: 4 mg via INTRAVENOUS

## 2015-09-27 MED ORDER — MORPHINE SULFATE (PF) 2 MG/ML IV SOLN
2.0000 mg | INTRAVENOUS | Status: DC | PRN
  Administered 2015-09-28 (×2): 2 mg via INTRAVENOUS
  Administered 2015-09-28: 4 mg via INTRAVENOUS
  Administered 2015-09-28: 2 mg via INTRAVENOUS
  Filled 2015-09-27 (×2): qty 1
  Filled 2015-09-27: qty 2
  Filled 2015-09-27: qty 1

## 2015-09-27 MED ORDER — ONDANSETRON HCL 4 MG/2ML IJ SOLN
INTRAMUSCULAR | Status: AC
  Filled 2015-09-27: qty 2

## 2015-09-27 MED ORDER — MORPHINE SULFATE (PF) 4 MG/ML IV SOLN
4.0000 mg | Freq: Once | INTRAVENOUS | Status: AC
  Administered 2015-09-27: 4 mg via INTRAVENOUS
  Filled 2015-09-27: qty 1

## 2015-09-27 MED ORDER — MORPHINE SULFATE (PF) 4 MG/ML IV SOLN
INTRAVENOUS | Status: AC
  Filled 2015-09-27: qty 1
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
## 2015-09-27 NOTE — ED Notes (Signed)
Wife at bedside.  [employer] ,  Phone , dentures and clothes give to wife.
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
## 2015-09-27 NOTE — ED Notes (Signed)
Minor tray at bedside for MD along with suture cart.

## 2015-09-27 NOTE — ED Notes (Signed)
Maxillofacial trauma MD at bedside. This RN and Wendie ChessKevin RN provided MD with sterile towels, 4x4, and 1% lidocaine with epi.

## 2015-09-27 NOTE — ED Notes (Signed)
Cleaned lacerations to upper and lower lip cleaned with sur clens.  Dentures broken upon removal and given to wife.

## 2015-09-27 NOTE — ED Triage Notes (Signed)
Laceration noted to upper lip, below nose. Redness to left upper chest-pain with palpation, and abdominal pain. Patient denies SOB at this time.

## 2015-09-27 NOTE — ED Notes (Signed)
Nasal cannula 2 liters started for sats 91%.

## 2015-09-27 NOTE — ED Notes (Signed)
Patient returned from Radiology, patient in a position of stated comfort. No needs voiced at this time.

## 2015-09-27 NOTE — ED Notes (Signed)
Patient out of department at this time via [facility]
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
## 2015-09-27 NOTE — ED Notes (Signed)
Dr Corliss Skainssuei in room with this RN and Dr Estell HarpinZammit
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
## 2015-09-28 ENCOUNTER — Encounter (HOSPITAL_COMMUNITY): Payer: Self-pay

## 2015-09-28 ENCOUNTER — Inpatient Hospital Stay (HOSPITAL_COMMUNITY): Payer: Medicare HMO

## 2015-09-28 LAB — GLUCOSE, CAPILLARY
GLUCOSE-CAPILLARY: 135 mg/dL — AB (ref 65–99)
GLUCOSE-CAPILLARY: 154 mg/dL — AB (ref 65–99)
Glucose-Capillary: 179 mg/dL — ABNORMAL HIGH (ref 65–99)

## 2015-09-28 LAB — COMPREHENSIVE METABOLIC PANEL
ALT: 15 U/L — AB (ref 17–63)
AST: 24 U/L (ref 15–41)
Albumin: 2.9 g/dL — ABNORMAL LOW (ref 3.5–5.0)
Alkaline Phosphatase: 73 U/L (ref 38–126)
Anion gap: 10 (ref 5–15)
BUN: 20 mg/dL (ref 6–20)
CALCIUM: 8.4 mg/dL — AB (ref 8.9–10.3)
CHLORIDE: 105 mmol/L (ref 101–111)
CO2: 24 mmol/L (ref 22–32)
Creatinine, Ser: 1.27 mg/dL — ABNORMAL HIGH (ref 0.61–1.24)
GFR, EST NON AFRICAN AMERICAN: 56 mL/min — AB (ref >60)
GLUCOSE: 156 mg/dL — AB (ref 65–99)
Potassium: 3.8 mmol/L (ref 3.5–5.1)
Sodium: 139 mmol/L (ref 135–145)
TOTAL PROTEIN: 6.1 g/dL — AB (ref 6.5–8.1)
Total Bilirubin: 0.7 mg/dL (ref 0.3–1.2)

## 2015-09-28 LAB — CBC
HCT: 34.9 % — ABNORMAL LOW (ref 39.0–52.0)
Hemoglobin: 10.4 g/dL — ABNORMAL LOW (ref 13.0–17.0)
MCH: 26.6 pg (ref 26.0–34.0)
MCHC: 29.8 g/dL — ABNORMAL LOW (ref 30.0–36.0)
MCV: 89.3 fL (ref 78.0–100.0)
PLATELETS: 152 10*3/uL (ref 150–400)
RBC: 3.91 MIL/uL — AB (ref 4.22–5.81)
RDW: 15.9 % — AB (ref 11.5–15.5)
WBC: 7.6 10*3/uL (ref 4.0–10.5)

## 2015-09-28 LAB — SAMPLE TO BLOOD BANK

## 2015-09-28 LAB — MRSA PCR SCREENING: MRSA by PCR: NEGATIVE

## 2015-09-28 LAB — CBG MONITORING, ED: Glucose-Capillary: 169 mg/dL — ABNORMAL HIGH (ref 65–99)

## 2015-09-28 MED ORDER — METFORMIN HCL 500 MG PO TABS
1000.0000 mg | ORAL_TABLET | Freq: Two times a day (BID) | ORAL | Status: DC
  Administered 2015-09-28: 1000 mg via ORAL
  Filled 2015-09-28: qty 2

## 2015-09-28 MED ORDER — SODIUM CHLORIDE 0.9 % IV SOLN
INTRAVENOUS | Status: DC
  Administered 2015-09-28: 1000 mL via INTRAVENOUS

## 2015-09-28 MED ORDER — ATORVASTATIN CALCIUM 20 MG PO TABS
20.0000 mg | ORAL_TABLET | Freq: Every day | ORAL | Status: DC
  Administered 2015-09-28 – 2015-09-30 (×3): 20 mg via ORAL
  Filled 2015-09-28 (×3): qty 1

## 2015-09-28 MED ORDER — GLIMEPIRIDE 4 MG PO TABS: 4.0000 mg | ORAL_TABLET | Freq: Every day | ORAL | Status: DC

## 2015-09-28 MED ORDER — GLIMEPIRIDE 4 MG PO TABS
4.0000 mg | ORAL_TABLET | Freq: Every day | ORAL | Status: DC
  Administered 2015-09-28 – 2015-09-30 (×3): 4 mg via ORAL
  Filled 2015-09-28 (×3): qty 1

## 2015-09-28 MED ORDER — INSULIN ASPART 100 UNIT/ML ~~LOC~~ SOLN: 0.0000 [IU] | Freq: Three times a day (TID) | SUBCUTANEOUS | Status: DC

## 2015-09-28 MED ORDER — ENOXAPARIN SODIUM 40 MG/0.4ML ~~LOC~~ SOLN
40.0000 mg | SUBCUTANEOUS | Status: DC
  Administered 2015-09-28 – 2015-09-30 (×3): 40 mg via SUBCUTANEOUS
  Filled 2015-09-28 (×2): qty 0.4

## 2015-09-28 MED ORDER — LINAGLIPTIN 5 MG PO TABS
5.0000 mg | ORAL_TABLET | Freq: Every day | ORAL | Status: DC
  Administered 2015-09-28 – 2015-09-30 (×3): 5 mg via ORAL
  Filled 2015-09-28 (×3): qty 1

## 2015-09-28 MED ORDER — ONDANSETRON HCL 4 MG PO TABS: 4.0000 mg | ORAL_TABLET | Freq: Four times a day (QID) | ORAL | Status: DC | PRN

## 2015-09-28 MED ORDER — LOSARTAN POTASSIUM 50 MG PO TABS
50.0000 mg | ORAL_TABLET | Freq: Every day | ORAL | Status: DC
  Administered 2015-09-28 – 2015-09-30 (×3): 50 mg via ORAL
  Filled 2015-09-28 (×3): qty 1

## 2015-09-28 MED ORDER — PNEUMOCOCCAL VAC POLYVALENT 25 MCG/0.5ML IJ INJ
0.5000 mL | INJECTION | INTRAMUSCULAR | Status: AC
  Administered 2015-09-29: 0.5 mL via INTRAMUSCULAR
  Filled 2015-09-28: qty 0.5

## 2015-09-28 MED ORDER — HYDROCHLOROTHIAZIDE 25 MG PO TABS
25.0000 mg | ORAL_TABLET | Freq: Every day | ORAL | Status: DC
  Administered 2015-09-28 – 2015-09-30 (×3): 25 mg via ORAL
  Filled 2015-09-28 (×3): qty 1

## 2015-09-28 MED ORDER — INSULIN ASPART 100 UNIT/ML ~~LOC~~ SOLN: 0.0000 [IU] | Freq: Every day | SUBCUTANEOUS | Status: DC

## 2015-09-28 MED ORDER — INSULIN ASPART 100 UNIT/ML ~~LOC~~ SOLN
0.0000 [IU] | Freq: Three times a day (TID) | SUBCUTANEOUS | Status: DC
Start: 2015-09-28 — End: 2015-09-30
  Administered 2015-09-28: 1 [IU] via SUBCUTANEOUS
  Administered 2015-09-28 – 2015-09-29 (×3): 2 [IU] via SUBCUTANEOUS
  Administered 2015-09-29: 1 [IU] via SUBCUTANEOUS

## 2015-09-28 MED ORDER — ONDANSETRON HCL 4 MG/2ML IJ SOLN: 4.0000 mg | Freq: Four times a day (QID) | INTRAMUSCULAR | Status: DC | PRN

## 2015-09-28 MED ORDER — ALLOPURINOL 300 MG PO TABS
300.0000 mg | ORAL_TABLET | Freq: Every day | ORAL | Status: DC
  Administered 2015-09-28 – 2015-09-30 (×3): 300 mg via ORAL
  Filled 2015-09-28 (×3): qty 1
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
## 2015-09-28 NOTE — Progress Notes (Signed)
Orthopedic Tech Progress Note Patient Details:  Purcell NailsMarvin Stevens 05-Apr-1945 [id number]  Ortho Devices Type of Ortho Device: Arm sling Ortho Device/Splint Location: lue Ortho Device/Splint Interventions: Application   Terrye Dombrosky 09/28/2015, 9:47 AM
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
## 2015-09-29 DIAGNOSIS — I10 Essential (primary) hypertension: Secondary | ICD-10-CM | POA: Diagnosis not present

## 2015-09-29 DIAGNOSIS — M109 Gout, unspecified: Secondary | ICD-10-CM | POA: Diagnosis not present

## 2015-09-29 DIAGNOSIS — D62 Acute posthemorrhagic anemia: Secondary | ICD-10-CM | POA: Diagnosis not present

## 2015-09-29 DIAGNOSIS — S225XXA Flail chest, initial encounter for closed fracture: Secondary | ICD-10-CM | POA: Diagnosis not present

## 2015-09-29 DIAGNOSIS — S01511A Laceration without foreign body of lip, initial encounter: Secondary | ICD-10-CM | POA: Diagnosis not present

## 2015-09-29 DIAGNOSIS — K219 Gastro-esophageal reflux disease without esophagitis: Secondary | ICD-10-CM | POA: Diagnosis not present

## 2015-09-29 DIAGNOSIS — E119 Type 2 diabetes mellitus without complications: Secondary | ICD-10-CM | POA: Diagnosis not present

## 2015-09-29 DIAGNOSIS — Z96652 Presence of left artificial knee joint: Secondary | ICD-10-CM | POA: Diagnosis not present

## 2015-09-29 DIAGNOSIS — S42112A Displaced fracture of body of scapula, left shoulder, initial encounter for closed fracture: Secondary | ICD-10-CM | POA: Diagnosis not present

## 2015-09-29 LAB — HEMOGLOBIN A1C
Hgb A1c MFr Bld: 7 % — ABNORMAL HIGH (ref 4.8–5.6)
MEAN PLASMA GLUCOSE: 154 mg/dL

## 2015-09-29 LAB — GLUCOSE, CAPILLARY
GLUCOSE-CAPILLARY: 112 mg/dL — AB (ref 65–99)
GLUCOSE-CAPILLARY: 134 mg/dL — AB (ref 65–99)
GLUCOSE-CAPILLARY: 164 mg/dL — AB (ref 65–99)
GLUCOSE-CAPILLARY: 182 mg/dL — AB (ref 65–99)
Glucose-Capillary: 110 mg/dL — ABNORMAL HIGH (ref 65–99)

## 2015-09-29 MED ORDER — MORPHINE SULFATE (PF) 2 MG/ML IV SOLN
2.0000 mg | INTRAVENOUS | Status: DC | PRN
  Administered 2015-09-29 (×2): 2 mg via INTRAVENOUS
  Filled 2015-09-29 (×2): qty 1

## 2015-09-29 MED ORDER — HYDROCODONE-ACETAMINOPHEN 5-325 MG PO TABS
1.0000 | ORAL_TABLET | ORAL | Status: DC | PRN
  Administered 2015-09-29 – 2015-09-30 (×3): 2 via ORAL
  Filled 2015-09-29 (×3): qty 2
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
## 2015-09-29 NOTE — Progress Notes (Signed)
Spoke with Trauma on call about patient getting oral dose of pain meds instead of IV doses due to made very sleepy states will place order around 1530 paged twice then called dr swinteck gave order for Norco verbal via telphone order

## 2015-09-30 DIAGNOSIS — D62 Acute posthemorrhagic anemia: Secondary | ICD-10-CM | POA: Diagnosis not present

## 2015-09-30 DIAGNOSIS — S42102A Fracture of unspecified part of scapula, left shoulder, initial encounter for closed fracture: Secondary | ICD-10-CM | POA: Diagnosis present

## 2015-09-30 LAB — GLUCOSE, CAPILLARY
GLUCOSE-CAPILLARY: 119 mg/dL — AB (ref 65–99)
Glucose-Capillary: 126 mg/dL — ABNORMAL HIGH (ref 65–99)
Glucose-Capillary: 183 mg/dL — ABNORMAL HIGH (ref 65–99)

## 2015-09-30 MED ORDER — MORPHINE SULFATE (PF) 2 MG/ML IV SOLN: 2.0000 mg | INTRAVENOUS | Status: DC | PRN

## 2015-09-30 MED ORDER — HYDROCODONE-ACETAMINOPHEN 5-325 MG PO TABS: 1.0000 | ORAL_TABLET | ORAL | 0 refills | Status: DC | PRN
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
## 2015-09-30 NOTE — Care Management Important Message (Signed)
Important Message  Patient Details  Name: Terry NailsMarvin Wuebker MRN: [id number] Date of Birth: 06-12-1945   Medicare Important Message Given:  Yes    Omario Ander 09/30/2015, 10:30 AM
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
## 2015-09-30 NOTE — Discharge Instructions (Signed)
Keep sling on at all times unless to wash armpits. No use of the arm.  Wash wounds daily in shower with soap and water. Do not soak. Apply antibiotic ointment (e.g. Neosporin) twice daily and as needed to keep moist.  No driving while taking hydrocodone.
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
## 2015-09-30 NOTE — Care Management Important Message (Signed)
Important Message  Patient Details  Name: Terry Stevens MRN: [id number] Date of Birth: 05-10-1945   Medicare Important Message Given:  Yes    [name] 09/30/2015, 10:32 AM

## 2015-10-14 DIAGNOSIS — S42112D Displaced fracture of body of scapula, left shoulder, subsequent encounter for fracture with routine healing: Secondary | ICD-10-CM | POA: Diagnosis not present

## 2015-10-22 DIAGNOSIS — M25512 Pain in left shoulder: Secondary | ICD-10-CM | POA: Diagnosis not present

## 2015-10-22 DIAGNOSIS — X58XXXD Exposure to other specified factors, subsequent encounter: Secondary | ICD-10-CM | POA: Diagnosis not present

## 2015-10-22 DIAGNOSIS — S42112D Displaced fracture of body of scapula, left shoulder, subsequent encounter for fracture with routine healing: Secondary | ICD-10-CM | POA: Diagnosis not present

## 2015-11-10 DIAGNOSIS — S42112D Displaced fracture of body of scapula, left shoulder, subsequent encounter for fracture with routine healing: Secondary | ICD-10-CM | POA: Diagnosis not present

## 2015-11-10 DIAGNOSIS — M25512 Pain in left shoulder: Secondary | ICD-10-CM | POA: Diagnosis not present

## 2015-11-10 DIAGNOSIS — X58XXXD Exposure to other specified factors, subsequent encounter: Secondary | ICD-10-CM | POA: Diagnosis not present

## 2015-11-12 DIAGNOSIS — X58XXXD Exposure to other specified factors, subsequent encounter: Secondary | ICD-10-CM | POA: Diagnosis not present

## 2015-11-12 DIAGNOSIS — S42112D Displaced fracture of body of scapula, left shoulder, subsequent encounter for fracture with routine healing: Secondary | ICD-10-CM | POA: Diagnosis not present

## 2015-11-12 DIAGNOSIS — M25512 Pain in left shoulder: Secondary | ICD-10-CM | POA: Diagnosis not present

## 2015-11-14 DIAGNOSIS — S42112D Displaced fracture of body of scapula, left shoulder, subsequent encounter for fracture with routine healing: Secondary | ICD-10-CM | POA: Diagnosis not present

## 2015-12-30 DIAGNOSIS — I10 Essential (primary) hypertension: Secondary | ICD-10-CM | POA: Diagnosis not present

## 2015-12-30 DIAGNOSIS — Z6830 Body mass index (BMI) 30.0-30.9, adult: Secondary | ICD-10-CM | POA: Diagnosis not present

## 2015-12-30 DIAGNOSIS — E784 Other hyperlipidemia: Secondary | ICD-10-CM | POA: Diagnosis not present

## 2015-12-30 DIAGNOSIS — E1165 Type 2 diabetes mellitus with hyperglycemia: Secondary | ICD-10-CM | POA: Diagnosis not present

## 2015-12-30 DIAGNOSIS — M1049 Other secondary gout, multiple sites: Secondary | ICD-10-CM | POA: Diagnosis not present

## 2016-04-01 DIAGNOSIS — Z1389 Encounter for screening for other disorder: Secondary | ICD-10-CM | POA: Diagnosis not present

## 2016-04-01 DIAGNOSIS — E1165 Type 2 diabetes mellitus with hyperglycemia: Secondary | ICD-10-CM | POA: Diagnosis not present

## 2016-04-01 DIAGNOSIS — I10 Essential (primary) hypertension: Secondary | ICD-10-CM | POA: Diagnosis not present

## 2016-04-01 DIAGNOSIS — M1049 Other secondary gout, multiple sites: Secondary | ICD-10-CM | POA: Diagnosis not present

## 2016-04-01 DIAGNOSIS — Z6830 Body mass index (BMI) 30.0-30.9, adult: Secondary | ICD-10-CM | POA: Diagnosis not present

## 2016-04-01 DIAGNOSIS — Z0000 Encounter for general adult medical examination without abnormal findings: Secondary | ICD-10-CM | POA: Diagnosis not present

## 2016-04-01 DIAGNOSIS — E784 Other hyperlipidemia: Secondary | ICD-10-CM | POA: Diagnosis not present

## 2016-06-29 DIAGNOSIS — Z6831 Body mass index (BMI) 31.0-31.9, adult: Secondary | ICD-10-CM | POA: Diagnosis not present

## 2016-06-29 DIAGNOSIS — E1165 Type 2 diabetes mellitus with hyperglycemia: Secondary | ICD-10-CM | POA: Diagnosis not present

## 2016-06-29 DIAGNOSIS — M1049 Other secondary gout, multiple sites: Secondary | ICD-10-CM | POA: Diagnosis not present

## 2016-06-29 DIAGNOSIS — E784 Other hyperlipidemia: Secondary | ICD-10-CM | POA: Diagnosis not present

## 2016-06-29 DIAGNOSIS — I10 Essential (primary) hypertension: Secondary | ICD-10-CM | POA: Diagnosis not present

## 2016-07-28 DIAGNOSIS — Z79899 Other long term (current) drug therapy: Secondary | ICD-10-CM | POA: Diagnosis not present

## 2016-10-04 DIAGNOSIS — M1049 Other secondary gout, multiple sites: Secondary | ICD-10-CM | POA: Diagnosis not present

## 2016-10-04 DIAGNOSIS — J3089 Other allergic rhinitis: Secondary | ICD-10-CM | POA: Diagnosis not present

## 2016-10-04 DIAGNOSIS — E784 Other hyperlipidemia: Secondary | ICD-10-CM | POA: Diagnosis not present

## 2016-10-04 DIAGNOSIS — Z125 Encounter for screening for malignant neoplasm of prostate: Secondary | ICD-10-CM | POA: Diagnosis not present

## 2016-10-04 DIAGNOSIS — Z6831 Body mass index (BMI) 31.0-31.9, adult: Secondary | ICD-10-CM | POA: Diagnosis not present

## 2016-10-04 DIAGNOSIS — E1165 Type 2 diabetes mellitus with hyperglycemia: Secondary | ICD-10-CM | POA: Diagnosis not present

## 2016-10-04 DIAGNOSIS — I10 Essential (primary) hypertension: Secondary | ICD-10-CM | POA: Diagnosis not present

## 2016-11-04 ENCOUNTER — Ambulatory Visit (INDEPENDENT_AMBULATORY_CARE_PROVIDER_SITE_OTHER): Payer: Medicare HMO | Admitting: Otolaryngology

## 2016-11-04 DIAGNOSIS — H6123 Impacted cerumen, bilateral: Secondary | ICD-10-CM

## 2016-11-04 DIAGNOSIS — J310 Chronic rhinitis: Secondary | ICD-10-CM

## 2016-11-04 DIAGNOSIS — J343 Hypertrophy of nasal turbinates: Secondary | ICD-10-CM | POA: Diagnosis not present

## 2016-12-02 ENCOUNTER — Ambulatory Visit (INDEPENDENT_AMBULATORY_CARE_PROVIDER_SITE_OTHER): Payer: Medicare HMO | Admitting: Otolaryngology

## 2016-12-02 DIAGNOSIS — J343 Hypertrophy of nasal turbinates: Secondary | ICD-10-CM | POA: Diagnosis not present

## 2016-12-02 DIAGNOSIS — R430 Anosmia: Secondary | ICD-10-CM

## 2016-12-02 DIAGNOSIS — J310 Chronic rhinitis: Secondary | ICD-10-CM

## 2017-01-06 DIAGNOSIS — I10 Essential (primary) hypertension: Secondary | ICD-10-CM | POA: Diagnosis not present

## 2017-01-06 DIAGNOSIS — J3089 Other allergic rhinitis: Secondary | ICD-10-CM | POA: Diagnosis not present

## 2017-01-06 DIAGNOSIS — E7849 Other hyperlipidemia: Secondary | ICD-10-CM | POA: Diagnosis not present

## 2017-01-06 DIAGNOSIS — Z6831 Body mass index (BMI) 31.0-31.9, adult: Secondary | ICD-10-CM | POA: Diagnosis not present

## 2017-01-06 DIAGNOSIS — E119 Type 2 diabetes mellitus without complications: Secondary | ICD-10-CM | POA: Diagnosis not present

## 2017-01-06 DIAGNOSIS — M1049 Other secondary gout, multiple sites: Secondary | ICD-10-CM | POA: Diagnosis not present

## 2017-03-03 ENCOUNTER — Ambulatory Visit (INDEPENDENT_AMBULATORY_CARE_PROVIDER_SITE_OTHER): Payer: Medicare HMO | Admitting: Otolaryngology

## 2017-04-18 DIAGNOSIS — Z1389 Encounter for screening for other disorder: Secondary | ICD-10-CM | POA: Diagnosis not present

## 2017-04-18 DIAGNOSIS — I10 Essential (primary) hypertension: Secondary | ICD-10-CM | POA: Diagnosis not present

## 2017-04-18 DIAGNOSIS — J3089 Other allergic rhinitis: Secondary | ICD-10-CM | POA: Diagnosis not present

## 2017-04-18 DIAGNOSIS — E119 Type 2 diabetes mellitus without complications: Secondary | ICD-10-CM | POA: Diagnosis not present

## 2017-04-18 DIAGNOSIS — M1049 Other secondary gout, multiple sites: Secondary | ICD-10-CM | POA: Diagnosis not present

## 2017-04-18 DIAGNOSIS — Z6831 Body mass index (BMI) 31.0-31.9, adult: Secondary | ICD-10-CM | POA: Diagnosis not present

## 2017-04-18 DIAGNOSIS — E7849 Other hyperlipidemia: Secondary | ICD-10-CM | POA: Diagnosis not present

## 2017-04-18 DIAGNOSIS — Z0000 Encounter for general adult medical examination without abnormal findings: Secondary | ICD-10-CM | POA: Diagnosis not present

## 2017-07-12 DIAGNOSIS — Z8249 Family history of ischemic heart disease and other diseases of the circulatory system: Secondary | ICD-10-CM | POA: Diagnosis not present

## 2017-07-12 DIAGNOSIS — E785 Hyperlipidemia, unspecified: Secondary | ICD-10-CM | POA: Diagnosis not present

## 2017-07-12 DIAGNOSIS — I10 Essential (primary) hypertension: Secondary | ICD-10-CM | POA: Diagnosis not present

## 2017-07-12 DIAGNOSIS — M109 Gout, unspecified: Secondary | ICD-10-CM | POA: Diagnosis not present

## 2017-07-12 DIAGNOSIS — E669 Obesity, unspecified: Secondary | ICD-10-CM | POA: Diagnosis not present

## 2017-07-12 DIAGNOSIS — K219 Gastro-esophageal reflux disease without esophagitis: Secondary | ICD-10-CM | POA: Diagnosis not present

## 2017-07-12 DIAGNOSIS — E119 Type 2 diabetes mellitus without complications: Secondary | ICD-10-CM | POA: Diagnosis not present

## 2017-07-12 DIAGNOSIS — Z6831 Body mass index (BMI) 31.0-31.9, adult: Secondary | ICD-10-CM | POA: Diagnosis not present

## 2017-07-12 DIAGNOSIS — Z7984 Long term (current) use of oral hypoglycemic drugs: Secondary | ICD-10-CM | POA: Diagnosis not present

## 2017-07-12 DIAGNOSIS — Z7982 Long term (current) use of aspirin: Secondary | ICD-10-CM | POA: Diagnosis not present

## 2017-07-26 DIAGNOSIS — E119 Type 2 diabetes mellitus without complications: Secondary | ICD-10-CM | POA: Diagnosis not present

## 2017-07-26 DIAGNOSIS — I10 Essential (primary) hypertension: Secondary | ICD-10-CM | POA: Diagnosis not present

## 2017-07-26 DIAGNOSIS — J3089 Other allergic rhinitis: Secondary | ICD-10-CM | POA: Diagnosis not present

## 2017-07-26 DIAGNOSIS — E7849 Other hyperlipidemia: Secondary | ICD-10-CM | POA: Diagnosis not present

## 2017-07-26 DIAGNOSIS — Z6831 Body mass index (BMI) 31.0-31.9, adult: Secondary | ICD-10-CM | POA: Diagnosis not present

## 2017-07-26 DIAGNOSIS — M1049 Other secondary gout, multiple sites: Secondary | ICD-10-CM | POA: Diagnosis not present
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
## 2017-09-15 DIAGNOSIS — H524 Presbyopia: Secondary | ICD-10-CM | POA: Diagnosis not present

## 2017-10-26 DIAGNOSIS — Z6830 Body mass index (BMI) 30.0-30.9, adult: Secondary | ICD-10-CM | POA: Diagnosis not present

## 2017-10-26 DIAGNOSIS — J3089 Other allergic rhinitis: Secondary | ICD-10-CM | POA: Diagnosis not present

## 2017-10-26 DIAGNOSIS — I10 Essential (primary) hypertension: Secondary | ICD-10-CM | POA: Diagnosis not present

## 2017-10-26 DIAGNOSIS — E119 Type 2 diabetes mellitus without complications: Secondary | ICD-10-CM | POA: Diagnosis not present

## 2017-10-26 DIAGNOSIS — E782 Mixed hyperlipidemia: Secondary | ICD-10-CM | POA: Diagnosis not present

## 2017-10-26 DIAGNOSIS — Z6831 Body mass index (BMI) 31.0-31.9, adult: Secondary | ICD-10-CM | POA: Diagnosis not present

## 2017-10-26 DIAGNOSIS — M1049 Other secondary gout, multiple sites: Secondary | ICD-10-CM | POA: Diagnosis not present

## 2017-10-26 DIAGNOSIS — G5601 Carpal tunnel syndrome, right upper limb: Secondary | ICD-10-CM | POA: Diagnosis not present

## 2017-10-26 DIAGNOSIS — E7849 Other hyperlipidemia: Secondary | ICD-10-CM | POA: Diagnosis not present

## 2017-10-26 DIAGNOSIS — Z125 Encounter for screening for malignant neoplasm of prostate: Secondary | ICD-10-CM | POA: Diagnosis not present

## 2017-10-26 DIAGNOSIS — Z1159 Encounter for screening for other viral diseases: Secondary | ICD-10-CM | POA: Diagnosis not present

## 2018-01-25 DIAGNOSIS — N182 Chronic kidney disease, stage 2 (mild): Secondary | ICD-10-CM | POA: Diagnosis not present

## 2018-01-25 DIAGNOSIS — E7849 Other hyperlipidemia: Secondary | ICD-10-CM | POA: Diagnosis not present

## 2018-01-25 DIAGNOSIS — I10 Essential (primary) hypertension: Secondary | ICD-10-CM | POA: Diagnosis not present

## 2018-01-25 DIAGNOSIS — J3089 Other allergic rhinitis: Secondary | ICD-10-CM | POA: Diagnosis not present

## 2018-01-25 DIAGNOSIS — M1049 Other secondary gout, multiple sites: Secondary | ICD-10-CM | POA: Diagnosis not present

## 2018-01-25 DIAGNOSIS — Z6831 Body mass index (BMI) 31.0-31.9, adult: Secondary | ICD-10-CM | POA: Diagnosis not present

## 2018-01-25 DIAGNOSIS — E1121 Type 2 diabetes mellitus with diabetic nephropathy: Secondary | ICD-10-CM | POA: Diagnosis not present

## 2018-05-15 DIAGNOSIS — Z1389 Encounter for screening for other disorder: Secondary | ICD-10-CM | POA: Diagnosis not present

## 2018-05-15 DIAGNOSIS — E7849 Other hyperlipidemia: Secondary | ICD-10-CM | POA: Diagnosis not present

## 2018-05-15 DIAGNOSIS — M1049 Other secondary gout, multiple sites: Secondary | ICD-10-CM | POA: Diagnosis not present

## 2018-05-15 DIAGNOSIS — N182 Chronic kidney disease, stage 2 (mild): Secondary | ICD-10-CM | POA: Diagnosis not present

## 2018-05-15 DIAGNOSIS — Z0000 Encounter for general adult medical examination without abnormal findings: Secondary | ICD-10-CM | POA: Diagnosis not present

## 2018-05-15 DIAGNOSIS — Z6830 Body mass index (BMI) 30.0-30.9, adult: Secondary | ICD-10-CM | POA: Diagnosis not present

## 2018-05-15 DIAGNOSIS — E1121 Type 2 diabetes mellitus with diabetic nephropathy: Secondary | ICD-10-CM | POA: Diagnosis not present

## 2018-05-15 DIAGNOSIS — I10 Essential (primary) hypertension: Secondary | ICD-10-CM | POA: Diagnosis not present

## 2018-05-15 DIAGNOSIS — J3089 Other allergic rhinitis: Secondary | ICD-10-CM | POA: Diagnosis not present

## 2018-05-22 DIAGNOSIS — E7849 Other hyperlipidemia: Secondary | ICD-10-CM | POA: Diagnosis not present

## 2018-05-22 DIAGNOSIS — I10 Essential (primary) hypertension: Secondary | ICD-10-CM | POA: Diagnosis not present

## 2018-05-22 DIAGNOSIS — E1121 Type 2 diabetes mellitus with diabetic nephropathy: Secondary | ICD-10-CM | POA: Diagnosis not present

## 2018-05-22 DIAGNOSIS — N182 Chronic kidney disease, stage 2 (mild): Secondary | ICD-10-CM | POA: Diagnosis not present

## 2018-05-22 DIAGNOSIS — J441 Chronic obstructive pulmonary disease with (acute) exacerbation: Secondary | ICD-10-CM | POA: Diagnosis not present

## 2018-05-22 DIAGNOSIS — M1049 Other secondary gout, multiple sites: Secondary | ICD-10-CM | POA: Diagnosis not present

## 2018-08-28 DIAGNOSIS — E1121 Type 2 diabetes mellitus with diabetic nephropathy: Secondary | ICD-10-CM | POA: Diagnosis not present

## 2018-08-28 DIAGNOSIS — J441 Chronic obstructive pulmonary disease with (acute) exacerbation: Secondary | ICD-10-CM | POA: Diagnosis not present

## 2018-08-28 DIAGNOSIS — J3089 Other allergic rhinitis: Secondary | ICD-10-CM | POA: Diagnosis not present

## 2018-08-28 DIAGNOSIS — Z6830 Body mass index (BMI) 30.0-30.9, adult: Secondary | ICD-10-CM | POA: Diagnosis not present

## 2018-08-28 DIAGNOSIS — N182 Chronic kidney disease, stage 2 (mild): Secondary | ICD-10-CM | POA: Diagnosis not present

## 2018-08-28 DIAGNOSIS — M1049 Other secondary gout, multiple sites: Secondary | ICD-10-CM | POA: Diagnosis not present

## 2018-08-28 DIAGNOSIS — I10 Essential (primary) hypertension: Secondary | ICD-10-CM | POA: Diagnosis not present

## 2018-10-08 DIAGNOSIS — R0602 Shortness of breath: Secondary | ICD-10-CM | POA: Diagnosis not present

## 2018-10-08 DIAGNOSIS — E162 Hypoglycemia, unspecified: Secondary | ICD-10-CM | POA: Diagnosis not present

## 2018-10-08 DIAGNOSIS — E161 Other hypoglycemia: Secondary | ICD-10-CM | POA: Diagnosis not present

## 2018-10-08 DIAGNOSIS — U071 COVID-19: Secondary | ICD-10-CM | POA: Diagnosis not present

## 2018-10-08 DIAGNOSIS — R410 Disorientation, unspecified: Secondary | ICD-10-CM | POA: Diagnosis not present

## 2018-10-09 DIAGNOSIS — B349 Viral infection, unspecified: Secondary | ICD-10-CM | POA: Diagnosis not present

## 2018-10-09 DIAGNOSIS — M109 Gout, unspecified: Secondary | ICD-10-CM | POA: Diagnosis not present

## 2018-10-09 DIAGNOSIS — I10 Essential (primary) hypertension: Secondary | ICD-10-CM | POA: Diagnosis not present

## 2018-10-09 DIAGNOSIS — R1110 Vomiting, unspecified: Secondary | ICD-10-CM | POA: Diagnosis not present

## 2018-10-09 DIAGNOSIS — E119 Type 2 diabetes mellitus without complications: Secondary | ICD-10-CM | POA: Diagnosis not present

## 2018-10-09 DIAGNOSIS — R918 Other nonspecific abnormal finding of lung field: Secondary | ICD-10-CM | POA: Diagnosis not present

## 2018-10-10 ENCOUNTER — Other Ambulatory Visit: Payer: Self-pay

## 2018-10-10 DIAGNOSIS — R6889 Other general symptoms and signs: Secondary | ICD-10-CM | POA: Diagnosis not present

## 2018-10-10 DIAGNOSIS — Z20822 Contact with and (suspected) exposure to covid-19: Secondary | ICD-10-CM

## 2018-10-12 ENCOUNTER — Ambulatory Visit: Payer: Self-pay

## 2018-10-12 DIAGNOSIS — E785 Hyperlipidemia, unspecified: Secondary | ICD-10-CM | POA: Diagnosis not present

## 2018-10-12 DIAGNOSIS — N189 Chronic kidney disease, unspecified: Secondary | ICD-10-CM | POA: Diagnosis not present

## 2018-10-12 DIAGNOSIS — U071 COVID-19: Secondary | ICD-10-CM | POA: Diagnosis not present

## 2018-10-12 DIAGNOSIS — Z7984 Long term (current) use of oral hypoglycemic drugs: Secondary | ICD-10-CM | POA: Diagnosis not present

## 2018-10-12 DIAGNOSIS — J9601 Acute respiratory failure with hypoxia: Secondary | ICD-10-CM | POA: Diagnosis not present

## 2018-10-12 DIAGNOSIS — E871 Hypo-osmolality and hyponatremia: Secondary | ICD-10-CM | POA: Diagnosis not present

## 2018-10-12 DIAGNOSIS — E1122 Type 2 diabetes mellitus with diabetic chronic kidney disease: Secondary | ICD-10-CM | POA: Diagnosis not present

## 2018-10-12 DIAGNOSIS — J1289 Other viral pneumonia: Secondary | ICD-10-CM | POA: Diagnosis not present

## 2018-10-12 DIAGNOSIS — I129 Hypertensive chronic kidney disease with stage 1 through stage 4 chronic kidney disease, or unspecified chronic kidney disease: Secondary | ICD-10-CM | POA: Diagnosis not present

## 2018-10-12 DIAGNOSIS — N170 Acute kidney failure with tubular necrosis: Secondary | ICD-10-CM | POA: Diagnosis not present

## 2018-10-12 DIAGNOSIS — R0602 Shortness of breath: Secondary | ICD-10-CM | POA: Diagnosis not present

## 2018-10-12 LAB — NOVEL CORONAVIRUS, NAA: SARS-CoV-2, NAA: DETECTED — AB

## 2018-10-12 NOTE — Telephone Encounter (Signed)
Pt wife called stating that her husband was positive for the COVID-19. He has not been eating well and has fallen last weak because of weakness and low BS. She state that he has run fever. Today 99.5 in am. His BS 111 this am and at 12:25 it was 295.  She states she has been holding his Blood Sugar medication. She is concerned because he is not acting right and is confused. She states that his lips look darker than usual. Per protocol she will call ([phone]) and have him transferred to the hospital. She will inform EMS of her husbands COVID-19 positive status. Ms Cuaresma was tested positive but her last test was negative.  She was advised to protect herself. She verbalized understanding  Reason for Disposition . Difficulty breathing or bluish lips  Answer Assessment - Initial Assessment Questions 1. LEVEL OF CONSCIOUSNESS: "How is he (she, the patient) acting right now?" (e.g., alert-oriented, confused, lethargic, stuporous, comatose)     confused 2. ONSET: "When did the confusion start?"  (minutes, hours, days)    today 3. PATTERN "Does this come and go, or has it been constant since it started?"  "Is it present now?"     no 4. ALCOHOL or DRUGS: "Has he been drinking alcohol or taking any drugs?"     no 5. NARCOTIC MEDICATIONS: "Has he been receiving any narcotic medications?" (e.g., morphine, Vicodin)     no 6. CAUSE: "What do you think is causing the confusion?"      COVID-19 positive 7. OTHER SYMPTOMS: "Are there any other symptoms?" (e.g., difficulty breathing, headache, fever, weakness)    No blue around lips  Protocols used: CONFUSION - DELIRIUM-A-AH

## 2018-10-27 ENCOUNTER — Other Ambulatory Visit: Payer: Self-pay | Admitting: *Deleted

## 2018-10-27 DIAGNOSIS — R6889 Other general symptoms and signs: Secondary | ICD-10-CM | POA: Diagnosis not present

## 2018-10-27 DIAGNOSIS — Z20822 Contact with and (suspected) exposure to covid-19: Secondary | ICD-10-CM

## 2018-10-27 DIAGNOSIS — U071 COVID-19: Secondary | ICD-10-CM | POA: Diagnosis not present

## 2018-10-28 LAB — NOVEL CORONAVIRUS, NAA: SARS-CoV-2, NAA: NOT DETECTED

## 2018-11-02 DIAGNOSIS — N179 Acute kidney failure, unspecified: Secondary | ICD-10-CM | POA: Diagnosis not present

## 2018-11-02 DIAGNOSIS — N178 Other acute kidney failure: Secondary | ICD-10-CM | POA: Diagnosis not present

## 2018-11-02 DIAGNOSIS — R918 Other nonspecific abnormal finding of lung field: Secondary | ICD-10-CM | POA: Diagnosis not present

## 2018-11-02 DIAGNOSIS — J1281 Pneumonia due to SARS-associated coronavirus: Secondary | ICD-10-CM | POA: Diagnosis not present

## 2018-11-02 DIAGNOSIS — Z6827 Body mass index (BMI) 27.0-27.9, adult: Secondary | ICD-10-CM | POA: Diagnosis not present

## 2018-11-02 DIAGNOSIS — J189 Pneumonia, unspecified organism: Secondary | ICD-10-CM | POA: Diagnosis not present

## 2018-11-22 DIAGNOSIS — U071 COVID-19: Secondary | ICD-10-CM | POA: Diagnosis not present

## 2018-11-28 DIAGNOSIS — I10 Essential (primary) hypertension: Secondary | ICD-10-CM | POA: Diagnosis not present

## 2018-11-28 DIAGNOSIS — Z6828 Body mass index (BMI) 28.0-28.9, adult: Secondary | ICD-10-CM | POA: Diagnosis not present

## 2018-11-28 DIAGNOSIS — E1121 Type 2 diabetes mellitus with diabetic nephropathy: Secondary | ICD-10-CM | POA: Diagnosis not present

## 2018-11-28 DIAGNOSIS — I5032 Chronic diastolic (congestive) heart failure: Secondary | ICD-10-CM | POA: Diagnosis not present

## 2018-11-28 DIAGNOSIS — M1009 Idiopathic gout, multiple sites: Secondary | ICD-10-CM | POA: Diagnosis not present

## 2018-11-28 DIAGNOSIS — K219 Gastro-esophageal reflux disease without esophagitis: Secondary | ICD-10-CM | POA: Diagnosis not present

## 2018-11-28 DIAGNOSIS — E782 Mixed hyperlipidemia: Secondary | ICD-10-CM | POA: Diagnosis not present

## 2018-11-28 DIAGNOSIS — N178 Other acute kidney failure: Secondary | ICD-10-CM | POA: Diagnosis not present

## 2018-12-19 DIAGNOSIS — I5032 Chronic diastolic (congestive) heart failure: Secondary | ICD-10-CM | POA: Diagnosis not present

## 2018-12-19 DIAGNOSIS — Z6829 Body mass index (BMI) 29.0-29.9, adult: Secondary | ICD-10-CM | POA: Diagnosis not present

## 2018-12-22 DIAGNOSIS — R7989 Other specified abnormal findings of blood chemistry: Secondary | ICD-10-CM | POA: Diagnosis not present

## 2018-12-22 DIAGNOSIS — I5032 Chronic diastolic (congestive) heart failure: Secondary | ICD-10-CM | POA: Diagnosis not present

## 2018-12-23 DIAGNOSIS — U071 COVID-19: Secondary | ICD-10-CM | POA: Diagnosis not present

## 2019-01-22 DIAGNOSIS — U071 COVID-19: Secondary | ICD-10-CM | POA: Diagnosis not present

## 2019-02-22 DIAGNOSIS — U071 COVID-19: Secondary | ICD-10-CM | POA: Diagnosis not present

## 2019-02-28 DIAGNOSIS — M1009 Idiopathic gout, multiple sites: Secondary | ICD-10-CM | POA: Diagnosis not present

## 2019-02-28 DIAGNOSIS — K219 Gastro-esophageal reflux disease without esophagitis: Secondary | ICD-10-CM | POA: Diagnosis not present

## 2019-02-28 DIAGNOSIS — E1121 Type 2 diabetes mellitus with diabetic nephropathy: Secondary | ICD-10-CM | POA: Diagnosis not present

## 2019-02-28 DIAGNOSIS — Z6829 Body mass index (BMI) 29.0-29.9, adult: Secondary | ICD-10-CM | POA: Diagnosis not present

## 2019-02-28 DIAGNOSIS — I5032 Chronic diastolic (congestive) heart failure: Secondary | ICD-10-CM | POA: Diagnosis not present

## 2019-02-28 DIAGNOSIS — I10 Essential (primary) hypertension: Secondary | ICD-10-CM | POA: Diagnosis not present

## 2019-02-28 DIAGNOSIS — E782 Mixed hyperlipidemia: Secondary | ICD-10-CM | POA: Diagnosis not present

## 2019-05-05 DIAGNOSIS — M109 Gout, unspecified: Secondary | ICD-10-CM | POA: Diagnosis not present

## 2019-05-05 DIAGNOSIS — E11630 Type 2 diabetes mellitus with periodontal disease: Secondary | ICD-10-CM | POA: Diagnosis not present

## 2019-05-05 DIAGNOSIS — Z6835 Body mass index (BMI) 35.0-35.9, adult: Secondary | ICD-10-CM | POA: Diagnosis not present

## 2019-05-05 DIAGNOSIS — Z87891 Personal history of nicotine dependence: Secondary | ICD-10-CM | POA: Diagnosis not present

## 2019-05-05 DIAGNOSIS — E1136 Type 2 diabetes mellitus with diabetic cataract: Secondary | ICD-10-CM | POA: Diagnosis not present

## 2019-05-05 DIAGNOSIS — E785 Hyperlipidemia, unspecified: Secondary | ICD-10-CM | POA: Diagnosis not present

## 2019-05-05 DIAGNOSIS — Z794 Long term (current) use of insulin: Secondary | ICD-10-CM | POA: Diagnosis not present

## 2019-05-05 DIAGNOSIS — E669 Obesity, unspecified: Secondary | ICD-10-CM | POA: Diagnosis not present

## 2019-05-05 DIAGNOSIS — K219 Gastro-esophageal reflux disease without esophagitis: Secondary | ICD-10-CM | POA: Diagnosis not present

## 2019-05-05 DIAGNOSIS — I10 Essential (primary) hypertension: Secondary | ICD-10-CM | POA: Diagnosis not present

## 2019-06-05 DIAGNOSIS — K219 Gastro-esophageal reflux disease without esophagitis: Secondary | ICD-10-CM | POA: Diagnosis not present

## 2019-06-05 DIAGNOSIS — Z1331 Encounter for screening for depression: Secondary | ICD-10-CM | POA: Diagnosis not present

## 2019-06-05 DIAGNOSIS — M1009 Idiopathic gout, multiple sites: Secondary | ICD-10-CM | POA: Diagnosis not present

## 2019-06-05 DIAGNOSIS — I10 Essential (primary) hypertension: Secondary | ICD-10-CM | POA: Diagnosis not present

## 2019-06-05 DIAGNOSIS — Z125 Encounter for screening for malignant neoplasm of prostate: Secondary | ICD-10-CM | POA: Diagnosis not present

## 2019-06-05 DIAGNOSIS — E1121 Type 2 diabetes mellitus with diabetic nephropathy: Secondary | ICD-10-CM | POA: Diagnosis not present

## 2019-06-05 DIAGNOSIS — E782 Mixed hyperlipidemia: Secondary | ICD-10-CM | POA: Diagnosis not present

## 2019-06-05 DIAGNOSIS — Z0000 Encounter for general adult medical examination without abnormal findings: Secondary | ICD-10-CM | POA: Diagnosis not present

## 2019-06-05 DIAGNOSIS — Z6830 Body mass index (BMI) 30.0-30.9, adult: Secondary | ICD-10-CM | POA: Diagnosis not present

## 2019-06-05 DIAGNOSIS — I5032 Chronic diastolic (congestive) heart failure: Secondary | ICD-10-CM | POA: Diagnosis not present

## 2019-07-25 DIAGNOSIS — R69 Illness, unspecified: Secondary | ICD-10-CM | POA: Diagnosis not present

## 2019-07-25 DIAGNOSIS — Z6830 Body mass index (BMI) 30.0-30.9, adult: Secondary | ICD-10-CM | POA: Diagnosis not present

## 2019-09-05 DIAGNOSIS — K219 Gastro-esophageal reflux disease without esophagitis: Secondary | ICD-10-CM | POA: Diagnosis not present

## 2019-09-05 DIAGNOSIS — M1009 Idiopathic gout, multiple sites: Secondary | ICD-10-CM | POA: Diagnosis not present

## 2019-09-05 DIAGNOSIS — E782 Mixed hyperlipidemia: Secondary | ICD-10-CM | POA: Diagnosis not present

## 2019-09-05 DIAGNOSIS — Z6832 Body mass index (BMI) 32.0-32.9, adult: Secondary | ICD-10-CM | POA: Diagnosis not present

## 2019-09-05 DIAGNOSIS — I5032 Chronic diastolic (congestive) heart failure: Secondary | ICD-10-CM | POA: Diagnosis not present

## 2019-09-05 DIAGNOSIS — R69 Illness, unspecified: Secondary | ICD-10-CM | POA: Diagnosis not present

## 2019-09-05 DIAGNOSIS — E1121 Type 2 diabetes mellitus with diabetic nephropathy: Secondary | ICD-10-CM | POA: Diagnosis not present

## 2019-12-06 DIAGNOSIS — R69 Illness, unspecified: Secondary | ICD-10-CM | POA: Diagnosis not present

## 2019-12-06 DIAGNOSIS — E782 Mixed hyperlipidemia: Secondary | ICD-10-CM | POA: Diagnosis not present

## 2019-12-06 DIAGNOSIS — I5032 Chronic diastolic (congestive) heart failure: Secondary | ICD-10-CM | POA: Diagnosis not present

## 2019-12-06 DIAGNOSIS — E1165 Type 2 diabetes mellitus with hyperglycemia: Secondary | ICD-10-CM | POA: Diagnosis not present

## 2019-12-06 DIAGNOSIS — Z6831 Body mass index (BMI) 31.0-31.9, adult: Secondary | ICD-10-CM | POA: Diagnosis not present

## 2019-12-06 DIAGNOSIS — K219 Gastro-esophageal reflux disease without esophagitis: Secondary | ICD-10-CM | POA: Diagnosis not present

## 2019-12-06 DIAGNOSIS — M1009 Idiopathic gout, multiple sites: Secondary | ICD-10-CM | POA: Diagnosis not present

## 2020-01-23 DIAGNOSIS — R69 Illness, unspecified: Secondary | ICD-10-CM | POA: Diagnosis not present

## 2020-01-27 DIAGNOSIS — I10 Essential (primary) hypertension: Secondary | ICD-10-CM | POA: Diagnosis not present

## 2020-01-27 DIAGNOSIS — E119 Type 2 diabetes mellitus without complications: Secondary | ICD-10-CM | POA: Diagnosis not present

## 2020-01-27 DIAGNOSIS — R030 Elevated blood-pressure reading, without diagnosis of hypertension: Secondary | ICD-10-CM | POA: Diagnosis not present

## 2020-01-27 DIAGNOSIS — M7989 Other specified soft tissue disorders: Secondary | ICD-10-CM | POA: Diagnosis not present

## 2020-01-27 DIAGNOSIS — M19022 Primary osteoarthritis, left elbow: Secondary | ICD-10-CM | POA: Diagnosis not present

## 2020-01-27 DIAGNOSIS — L02414 Cutaneous abscess of left upper limb: Secondary | ICD-10-CM | POA: Diagnosis not present

## 2020-01-27 DIAGNOSIS — Z872 Personal history of diseases of the skin and subcutaneous tissue: Secondary | ICD-10-CM | POA: Diagnosis not present

## 2020-01-29 DIAGNOSIS — Z4801 Encounter for change or removal of surgical wound dressing: Secondary | ICD-10-CM | POA: Diagnosis not present

## 2020-02-04 DIAGNOSIS — H524 Presbyopia: Secondary | ICD-10-CM | POA: Diagnosis not present

## 2020-02-04 DIAGNOSIS — H2513 Age-related nuclear cataract, bilateral: Secondary | ICD-10-CM | POA: Diagnosis not present

## 2020-02-04 DIAGNOSIS — Z794 Long term (current) use of insulin: Secondary | ICD-10-CM | POA: Diagnosis not present

## 2020-02-04 DIAGNOSIS — H40013 Open angle with borderline findings, low risk, bilateral: Secondary | ICD-10-CM | POA: Diagnosis not present

## 2020-02-04 DIAGNOSIS — E109 Type 1 diabetes mellitus without complications: Secondary | ICD-10-CM | POA: Diagnosis not present

## 2020-03-10 DIAGNOSIS — M1009 Idiopathic gout, multiple sites: Secondary | ICD-10-CM | POA: Diagnosis not present

## 2020-03-10 DIAGNOSIS — E1169 Type 2 diabetes mellitus with other specified complication: Secondary | ICD-10-CM | POA: Diagnosis not present

## 2020-03-10 DIAGNOSIS — K219 Gastro-esophageal reflux disease without esophagitis: Secondary | ICD-10-CM | POA: Diagnosis not present

## 2020-03-10 DIAGNOSIS — F0151 Vascular dementia with behavioral disturbance: Secondary | ICD-10-CM | POA: Diagnosis not present

## 2020-03-10 DIAGNOSIS — E782 Mixed hyperlipidemia: Secondary | ICD-10-CM | POA: Diagnosis not present

## 2020-03-10 DIAGNOSIS — I5032 Chronic diastolic (congestive) heart failure: Secondary | ICD-10-CM | POA: Diagnosis not present

## 2020-03-10 DIAGNOSIS — Z6832 Body mass index (BMI) 32.0-32.9, adult: Secondary | ICD-10-CM | POA: Diagnosis not present

## 2020-05-08 DIAGNOSIS — H01001 Unspecified blepharitis right upper eyelid: Secondary | ICD-10-CM | POA: Diagnosis not present

## 2020-05-08 DIAGNOSIS — H2513 Age-related nuclear cataract, bilateral: Secondary | ICD-10-CM | POA: Diagnosis not present

## 2020-05-08 DIAGNOSIS — H01004 Unspecified blepharitis left upper eyelid: Secondary | ICD-10-CM | POA: Diagnosis not present

## 2020-05-08 DIAGNOSIS — H01002 Unspecified blepharitis right lower eyelid: Secondary | ICD-10-CM | POA: Diagnosis not present

## 2020-06-04 NOTE — Patient Instructions (Signed)
Terry Stevens  06/04/2020     @PREFPERIOPPHARMACY @   Your procedure is scheduled on  06/13/2020.    Report to 08/13/2020 at  0700  A.M.   Call this number if you have problems the morning of surgery:  [phone]   Remember:  Do not eat or drink after midnight.                         Take these medicines the morning of surgery with A SIP OF WATER  Allopurinol, amlodipine, namenda.  Take 10 units of Levemir the night before your procedure.  DO NOT take any medications for diabetes the morning of your procedure.  If your glucose is 70 or below the morning of your procedure, drink 1/2 cup of clear liquids that contains sugar and recheck your glucose in 15 minutes. If your glucose is still 70 or below, call [phone] for instructions.  If your glucose is 300 or above the morning of your procedure, call [phone] for instructions.     Please brush your teeth.  Do not wear jewelry, make-up or nail polish.  Do not wear lotions, powders, or perfumes, or deodorant.  Do not shave 48 hours prior to surgery.  Men may shave face and neck.  Do not bring valuables to the hospital.  [facility] is not responsible for any belongings or valuables.  Contacts, dentures or bridgework may not be worn into surgery.  Leave your suitcase in the car.  After surgery it may be brought to your room.  For patients admitted to the hospital, discharge time will be determined by your treatment team.  Patients discharged the day of surgery will not be allowed to drive home and must have someone with them for 24 hours.    Special instructions:  DO NOT smoke tobacco or vape for 24 hours before your procedure.  Please read over the following fact sheets that you were given. Anesthesia Post-op Instructions and Care and Recovery After Surgery      Cataract Surgery, Care After This sheet gives you information about how to care for yourself after your procedure. Your health care provider may  also give you more specific instructions. If you have problems or questions, contact your health care provider. What can I expect after the procedure? After the procedure, it is common to have:  Itching.  Discomfort.  Fluid discharge.  Sensitivity to light and to touch.  Bruising in or around the eye.  Mild blurred vision. Follow these instructions at home: Eye care  Do not touch or rub your eyes.  Protect your eyes as told by your health care provider. You may be told to wear a protective eye shield or sunglasses.  Do not put a contact lens into the affected eye or eyes until your health care provider approves.  Keep the area around your eye clean and dry: ? Avoid swimming. ? Do not allow water to hit you directly in the face while showering. ? Keep soap and shampoo out of your eyes.  Check your eye every day for signs of infection. Watch for: ? Redness, swelling, or pain. ? Fluid, blood, or pus. ? Warmth. ? A bad smell. ? Vision that is getting worse. ? Sensitivity that is getting worse.   Activity  Do not drive for 24 hours if you were given a sedative during your procedure.  Avoid strenuous activities, such as playing contact sports, for as long  as told by your health care provider.  Do not drive or use heavy machinery until your health care provider approves.  Do not bend or lift heavy objects. Bending increases pressure in the eye. You can walk, climb stairs, and do light household chores.  Ask your health care provider when you can return to work. If you work in a dusty environment, you may be advised to wear protective eyewear for a period of time. General instructions  Take or apply over-the-counter and prescription medicines only as told by your health care provider. This includes eye drops.  Keep all follow-up visits as told by your health care provider. This is important. Contact a health care provider if:  You have increased bruising around your  eye.  You have pain that is not helped with medicine.  You have a fever.  You have redness, swelling, or pain in your eye.  You have fluid, blood, or pus coming from your incision.  Your vision gets worse.  Your sensitivity to light gets worse. Get help right away if:  You have sudden loss of vision.  You see flashes of light or spots (floaters).  You have severe eye pain.  You develop nausea or vomiting. Summary  After your procedure, it is common to have itching, discomfort, bruising, fluid discharge, or sensitivity to light.  Follow instructions from your health care provider about caring for your eye after the procedure.  Do not rub your eye after the procedure. You may need to wear eye protection or sunglasses. Do not wear contact lenses. Keep the area around your eye clean and dry.  Avoid activities that require a lot of effort. These include playing sports and lifting heavy objects.  Contact a health care provider if you have increased bruising, pain that does not go away, or a fever. Get help right away if you suddenly lose your vision, see flashes of light or spots, or have severe pain in the eye. This information is not intended to replace advice given to you by your health care provider. Make sure you discuss any questions you have with your health care provider. Document Revised: 11/21/2018 Document Reviewed: 07/25/2017 Elsevier Patient Education  [employer].

## 2020-06-04 NOTE — H&P (Signed)
Surgical History & Physical  Patient Name: Terry Stevens DOB: [birth year]  Surgery: Cataract extraction with intraocular lens implant phacoemulsification; Right Eye  Surgeon: Fabio Pierce MD Surgery Date:  06/13/2020 Pre-Op Date:  06/04/2020  HPI: A 3 Yr. old male patient is referred by Dr Terry Stevens for cataract eval. 1. The patient complains of difficulty when driving at night, which began 1 year ago. Both eyes are affected. The episode is gradual. The condition's severity increased since last visit. Symptoms occur when the patient is driving and outside. This is negatively affecting his quality of life. HPI Completed by Dr. Fabio Pierce  Medical History: Glaucoma Cataracts Diabetic Retinopathy Diabetes High Blood Pressure Hypercholesterolemia  Review of Systems Negative Allergic/Immunologic Negative Cardiovascular Negative Constitutional Negative Ear, Nose, Mouth & Throat Negative Endocrine Negative Eyes Negative Gastrointestinal Negative Genitourinary Negative Hemotologic/Lymphatic Negative Integumentary Negative Musculoskeletal Negative Neurological Negative Psychiatry Negative Respiratory  Social   Never smoked  Medication Allopurinol, Amiloride-HCTZ, Atorvastatin, Donepezil, Furosemide, Hydralazine, Terazosin HCl, Levemir,   Sx/Procedures Knee Replacement,   Drug Allergies   NKDA  History & Physical: Heent: Cataract, Right eye NECK: supple without bruits LUNGS: lungs clear to auscultation CV: regular rate and rhythm Abdomen: soft and non-tender  Impression & Plan: Assessment: 1.  NUCLEAR SCLEROSIS AGE RELATED; Both Eyes (H25.13) 2.  BLEPHARITIS; Right Upper Lid, Right Lower Lid, Left Upper Lid, Left Lower Lid (H01.001, H01.002,H01.004,H01.005) 3.  Pinguecula; Both Eyes (H11.153) 4.  ARCUS SENILIS; Both Eyes (H18.413) 5.  DERMATOCHALASIS; Right Upper Lid, Left Upper Lid (H02.831, Z00.923)  Plan: 1.  Cataract accounts for the patient's decreased  vision. This visual impairment is not correctable with a tolerable change in glasses or contact lenses. Cataract surgery with an implantation of a new lens should significantly improve the visual and functional status of the patient. Discussed all risks, benefits, alternatives, and potential complications. Discussed the procedures and recovery. Patient desires to have surgery. A-scan ordered and performed today for intra-ocular lens calculations. The surgery will be performed in order to improve vision for driving, reading, and for eye examinations. Recommend phacoemulsification with intra-ocular lens. Recommend Dextenza for post-operative pain and inflammation. Right Eye worse - first. Dilates well - shugarcaine by protocol. 2.  regular lid cleaning. 3.  Observe; Artificial tears as needed for irritation. 4.  Discussed significance of finding 5.  Asymptomatic, recommend observation for now. Findings, prognosis and treatment options reviewed.

## 2020-06-06 ENCOUNTER — Encounter (HOSPITAL_COMMUNITY)
Admission: RE | Admit: 2020-06-06 | Discharge: 2020-06-06 | Disposition: A | Discharge status: Home / Self Care | Payer: Medicare HMO | Source: Ambulatory Visit | Attending: Ophthalmology | Admitting: Ophthalmology

## 2020-06-06 ENCOUNTER — Other Ambulatory Visit: Payer: Self-pay

## 2020-06-06 ENCOUNTER — Encounter (HOSPITAL_COMMUNITY): Payer: Self-pay

## 2020-06-09 DIAGNOSIS — H2511 Age-related nuclear cataract, right eye: Secondary | ICD-10-CM | POA: Diagnosis not present

## 2020-06-10 DIAGNOSIS — Z6833 Body mass index (BMI) 33.0-33.9, adult: Secondary | ICD-10-CM | POA: Diagnosis not present

## 2020-06-10 DIAGNOSIS — F0151 Vascular dementia with behavioral disturbance: Secondary | ICD-10-CM | POA: Diagnosis not present

## 2020-06-10 DIAGNOSIS — K219 Gastro-esophageal reflux disease without esophagitis: Secondary | ICD-10-CM | POA: Diagnosis not present

## 2020-06-10 DIAGNOSIS — Z0000 Encounter for general adult medical examination without abnormal findings: Secondary | ICD-10-CM | POA: Diagnosis not present

## 2020-06-10 DIAGNOSIS — I5032 Chronic diastolic (congestive) heart failure: Secondary | ICD-10-CM | POA: Diagnosis not present

## 2020-06-10 DIAGNOSIS — E782 Mixed hyperlipidemia: Secondary | ICD-10-CM | POA: Diagnosis not present

## 2020-06-10 DIAGNOSIS — E1169 Type 2 diabetes mellitus with other specified complication: Secondary | ICD-10-CM | POA: Diagnosis not present

## 2020-06-10 DIAGNOSIS — M1009 Idiopathic gout, multiple sites: Secondary | ICD-10-CM | POA: Diagnosis not present

## 2020-06-11 ENCOUNTER — Other Ambulatory Visit: Payer: Self-pay

## 2020-06-11 ENCOUNTER — Other Ambulatory Visit (HOSPITAL_COMMUNITY)
Admission: RE | Admit: 2020-06-11 | Discharge: 2020-06-11 | Disposition: A | Discharge status: Home / Self Care | Payer: Medicare HMO | Source: Ambulatory Visit | Attending: Ophthalmology | Admitting: Ophthalmology

## 2020-06-11 DIAGNOSIS — Z01812 Encounter for preprocedural laboratory examination: Secondary | ICD-10-CM | POA: Insufficient documentation

## 2020-06-11 DIAGNOSIS — Z20822 Contact with and (suspected) exposure to covid-19: Secondary | ICD-10-CM | POA: Diagnosis not present

## 2020-06-11 LAB — SARS CORONAVIRUS 2 (TAT 6-24 HRS): SARS Coronavirus 2: NEGATIVE

## 2020-06-13 ENCOUNTER — Ambulatory Visit (HOSPITAL_COMMUNITY): Payer: Medicare HMO | Admitting: Anesthesiology

## 2020-06-13 ENCOUNTER — Ambulatory Visit (HOSPITAL_COMMUNITY)
Admission: RE | Admit: 2020-06-13 | Discharge: 2020-06-13 | Disposition: A | Discharge status: Home / Self Care | Payer: Medicare HMO | Attending: Ophthalmology | Admitting: Ophthalmology

## 2020-06-13 ENCOUNTER — Encounter (HOSPITAL_COMMUNITY)
Admission: RE | Disposition: A | Discharge status: Home / Self Care | Payer: Self-pay | Source: Home / Self Care | Attending: Ophthalmology

## 2020-06-13 DIAGNOSIS — Z87891 Personal history of nicotine dependence: Secondary | ICD-10-CM | POA: Insufficient documentation

## 2020-06-13 DIAGNOSIS — H0100A Unspecified blepharitis right eye, upper and lower eyelids: Secondary | ICD-10-CM | POA: Diagnosis not present

## 2020-06-13 DIAGNOSIS — H0100B Unspecified blepharitis left eye, upper and lower eyelids: Secondary | ICD-10-CM | POA: Insufficient documentation

## 2020-06-13 DIAGNOSIS — H18413 Arcus senilis, bilateral: Secondary | ICD-10-CM | POA: Insufficient documentation

## 2020-06-13 DIAGNOSIS — H02834 Dermatochalasis of left upper eyelid: Secondary | ICD-10-CM | POA: Insufficient documentation

## 2020-06-13 DIAGNOSIS — E11319 Type 2 diabetes mellitus with unspecified diabetic retinopathy without macular edema: Secondary | ICD-10-CM | POA: Diagnosis not present

## 2020-06-13 DIAGNOSIS — E1136 Type 2 diabetes mellitus with diabetic cataract: Secondary | ICD-10-CM | POA: Insufficient documentation

## 2020-06-13 DIAGNOSIS — H2513 Age-related nuclear cataract, bilateral: Secondary | ICD-10-CM | POA: Diagnosis not present

## 2020-06-13 DIAGNOSIS — Z96659 Presence of unspecified artificial knee joint: Secondary | ICD-10-CM | POA: Insufficient documentation

## 2020-06-13 DIAGNOSIS — Z79899 Other long term (current) drug therapy: Secondary | ICD-10-CM | POA: Insufficient documentation

## 2020-06-13 DIAGNOSIS — Z794 Long term (current) use of insulin: Secondary | ICD-10-CM | POA: Diagnosis not present

## 2020-06-13 DIAGNOSIS — H02831 Dermatochalasis of right upper eyelid: Secondary | ICD-10-CM | POA: Insufficient documentation

## 2020-06-13 DIAGNOSIS — F0390 Unspecified dementia without behavioral disturbance: Secondary | ICD-10-CM | POA: Diagnosis not present

## 2020-06-13 DIAGNOSIS — H11153 Pinguecula, bilateral: Secondary | ICD-10-CM | POA: Insufficient documentation

## 2020-06-13 DIAGNOSIS — H2511 Age-related nuclear cataract, right eye: Secondary | ICD-10-CM | POA: Diagnosis not present

## 2020-06-13 HISTORY — PX: CATARACT EXTRACTION W/PHACO: SHX586

## 2020-06-13 LAB — GLUCOSE, CAPILLARY: Glucose-Capillary: 116 mg/dL — ABNORMAL HIGH (ref 70–99)

## 2020-06-13 SURGERY — PHACOEMULSIFICATION, CATARACT, WITH IOL INSERTION
Anesthesia: Monitor Anesthesia Care | Site: Eye | Laterality: Right

## 2020-06-13 MED ORDER — LIDOCAINE 3.5 % OP GEL OPTIME - NO CHARGE
OPHTHALMIC | Status: DC | PRN
  Administered 2020-06-13: 1 [drp] via OPHTHALMIC

## 2020-06-13 MED ORDER — SODIUM HYALURONATE 10 MG/ML IO SOLUTION
PREFILLED_SYRINGE | INTRAOCULAR | Status: DC | PRN
  Administered 2020-06-13: 0.85 mL via INTRAOCULAR

## 2020-06-13 MED ORDER — EPINEPHRINE PF 1 MG/ML IJ SOLN
INTRAMUSCULAR | Status: AC
  Filled 2020-06-13: qty 2

## 2020-06-13 MED ORDER — NEOMYCIN-POLYMYXIN-DEXAMETH 3.5-10000-0.1 OP SUSP
OPHTHALMIC | Status: DC | PRN
  Administered 2020-06-13: 1 [drp] via OPHTHALMIC

## 2020-06-13 MED ORDER — FENTANYL CITRATE (PF) 100 MCG/2ML IJ SOLN
INTRAMUSCULAR | Status: DC | PRN
  Administered 2020-06-13: 25 ug via INTRAVENOUS

## 2020-06-13 MED ORDER — SODIUM CHLORIDE 0.9% FLUSH
INTRAVENOUS | Status: DC | PRN
  Administered 2020-06-13: 3 mL via INTRAVENOUS

## 2020-06-13 MED ORDER — EPINEPHRINE PF 1 MG/ML IJ SOLN
INTRAOCULAR | Status: DC | PRN
  Administered 2020-06-13: 500 mL

## 2020-06-13 MED ORDER — STERILE WATER FOR IRRIGATION IR SOLN
Status: DC | PRN
  Administered 2020-06-13: 250 mL

## 2020-06-13 MED ORDER — BSS IO SOLN
INTRAOCULAR | Status: DC | PRN
  Administered 2020-06-13: 15 mL via INTRAOCULAR

## 2020-06-13 MED ORDER — LIDOCAINE HCL 3.5 % OP GEL
1.0000 "application " | Freq: Once | OPHTHALMIC | Status: AC
  Administered 2020-06-13: 1 via OPHTHALMIC

## 2020-06-13 MED ORDER — SODIUM HYALURONATE 23MG/ML IO SOSY
PREFILLED_SYRINGE | INTRAOCULAR | Status: DC | PRN
  Administered 2020-06-13: 0.6 mL via INTRAOCULAR

## 2020-06-13 MED ORDER — PHENYLEPHRINE HCL 2.5 % OP SOLN
1.0000 [drp] | OPHTHALMIC | Status: AC | PRN
  Administered 2020-06-13 (×3): 1 [drp] via OPHTHALMIC

## 2020-06-13 MED ORDER — FENTANYL CITRATE (PF) 100 MCG/2ML IJ SOLN
INTRAMUSCULAR | Status: AC
  Filled 2020-06-13: qty 2

## 2020-06-13 MED ORDER — LIDOCAINE HCL (PF) 1 % IJ SOLN
INTRAOCULAR | Status: DC | PRN
  Administered 2020-06-13: 1 mL via OPHTHALMIC

## 2020-06-13 MED ORDER — TROPICAMIDE 1 % OP SOLN
1.0000 [drp] | OPHTHALMIC | Status: AC
  Administered 2020-06-13 (×3): 1 [drp] via OPHTHALMIC

## 2020-06-13 MED ORDER — TETRACAINE HCL 0.5 % OP SOLN
1.0000 [drp] | OPHTHALMIC | Status: AC | PRN
  Administered 2020-06-13 (×3): 1 [drp] via OPHTHALMIC

## 2020-06-13 MED ORDER — POVIDONE-IODINE 5 % OP SOLN
OPHTHALMIC | Status: DC | PRN
  Administered 2020-06-13: 1 via OPHTHALMIC

## 2020-06-13 SURGICAL SUPPLY — 14 items
CLOTH BEACON ORANGE TIMEOUT ST (SAFETY) ×2 IMPLANT
EYE SHIELD UNIVERSAL CLEAR (GAUZE/BANDAGES/DRESSINGS) ×2 IMPLANT
GLOVE SURG UNDER POLY LF SZ6.5 (GLOVE) ×2 IMPLANT
GLOVE SURG UNDER POLY LF SZ7 (GLOVE) ×2 IMPLANT
NEEDLE HYPO 18GX1.5 BLUNT FILL (NEEDLE) ×2 IMPLANT
PAD ARMBOARD 7.5X6 YLW CONV (MISCELLANEOUS) ×2 IMPLANT
RING MALYGIN (MISCELLANEOUS) IMPLANT
RING MALYGIN 7.0 (MISCELLANEOUS) IMPLANT
SYR TB 1ML LL NO SAFETY (SYRINGE) ×2 IMPLANT
TAPE SURG TRANSPORE 1 IN (GAUZE/BANDAGES/DRESSINGS) ×1 IMPLANT
TAPE SURGICAL TRANSPORE 1 IN (GAUZE/BANDAGES/DRESSINGS) ×1
Technis 1-Piece IOL (Intraocular Lens) ×2 IMPLANT
VISCOELASTIC ADDITIONAL (OPHTHALMIC RELATED) IMPLANT
WATER STERILE IRR 250ML POUR (IV SOLUTION) ×2 IMPLANT

## 2020-06-13 NOTE — Discharge Instructions (Signed)

## 2020-06-13 NOTE — Interval H&P Note (Signed)
History and Physical Interval Note:  06/13/2020 8:29 AM  [name]  has presented today for surgery, with the diagnosis of Nuclear sclerotic cataract - Right eye.  The various methods of treatment have been discussed with the patient and family. After consideration of risks, benefits and other options for treatment, the patient has consented to  Procedure(s) with comments: CATARACT EXTRACTION PHACO AND INTRAOCULAR LENS PLACEMENT RIGHT EYE (Right) - right as a surgical intervention.  The patient's history has been reviewed, patient examined, no change in status, stable for surgery.  I have reviewed the patient's chart and labs.  Questions were answered to the patient's satisfaction.     Terry Stevens

## 2020-06-13 NOTE — Anesthesia Preprocedure Evaluation (Signed)
Anesthesia Evaluation  Patient identified by MRN, date of birth, ID band Patient awake    Reviewed: Allergy & Precautions, NPO status , Patient's Chart, lab work & pertinent test results  History of Anesthesia Complications (+) history of anesthetic complications  Airway Mallampati: III  TM Distance: >3 FB Neck ROM: Full    Dental  (+) Upper Dentures, Lower Dentures   Pulmonary former smoker,    Pulmonary exam normal breath sounds clear to auscultation       Cardiovascular Exercise Tolerance: Good hypertension, Pt. on medications Normal cardiovascular exam Rhythm:Regular Rate:Normal     Neuro/Psych Dementia    GI/Hepatic GERD  Medicated and Controlled,  Endo/Other  diabetes, Well Controlled, Type 2, Insulin Dependent  Renal/GU      Musculoskeletal  (+) Arthritis ,   Abdominal   Peds  Hematology  (+) anemia ,   Anesthesia Other Findings   Reproductive/Obstetrics                            Anesthesia Physical Anesthesia Plan  ASA: II  Anesthesia Plan: MAC   Post-op Pain Management:    Induction:   PONV Risk Score and Plan:   Airway Management Planned: Nasal Cannula and Natural Airway  Additional Equipment:   Intra-op Plan:   Post-operative Plan:   Informed Consent: I have reviewed the patients History and Physical, chart, labs and discussed the procedure including the risks, benefits and alternatives for the proposed anesthesia with the patient or authorized representative who has indicated his/her understanding and acceptance.       Plan Discussed with: CRNA and Surgeon  Anesthesia Plan Comments:         Anesthesia Quick Evaluation

## 2020-06-13 NOTE — Op Note (Signed)
Date of procedure: 06/13/20  Pre-operative diagnosis: Visually significant age-related nuclear cataract, Right Eye (H25.11)  Post-operative diagnosis: Visually significant age-related nuclear cataract, Right Eye  Procedure: Removal of cataract via phacoemulsification and insertion of intra-ocular lens Wynetta Emery and [employer] DCB00  +20.5D into the capsular bag of the Right Eye  Attending surgeon: Gerda Diss. Ileigh Mettler, MD, MA  Anesthesia: MAC, Topical Akten  Complications: None  Estimated Blood Loss: <65m (minimal)  Specimens: None  Implants: As above  Indications:  Visually significant age-related cataract, Right Eye  Procedure:  The patient was seen and identified in the pre-operative area. The operative eye was identified and dilated.  The operative eye was marked.  Topical anesthesia was administered to the operative eye.     The patient was then to the operative suite and placed in the supine position.  A timeout was performed confirming the patient, procedure to be performed, and all other relevant information.   The patient's face was prepped and draped in the usual fashion for intra-ocular surgery.  A lid speculum was placed into the operative eye and the surgical microscope moved into place and focused.  A superotemporal paracentesis was created using a 20 gauge paracentesis blade.  Shugarcaine was injected into the anterior chamber.  Viscoelastic was injected into the anterior chamber.  A temporal clear-corneal main wound incision was created using a 2.450mmicrokeratome.  A continuous curvilinear capsulorrhexis was initiated using an irrigating cystitome and completed using capsulorrhexis forceps.  Hydrodissection and hydrodeliniation were performed.  Viscoelastic was injected into the anterior chamber.  A phacoemulsification handpiece and a chopper as a second instrument were used to remove the nucleus and epinucleus. The irrigation/aspiration handpiece was used to remove any  remaining cortical material.   The capsular bag was reinflated with viscoelastic, checked, and found to be intact.  The intraocular lens was inserted into the capsular bag.  The irrigation/aspiration handpiece was used to remove any remaining viscoelastic.  The clear corneal wound and paracentesis wounds were then hydrated and checked with Weck-Cels to be watertight.  The lid-speculum and drape was removed, and the patient's face was cleaned with a wet and dry 4x4.  Maxitrol was instilled in the eye before a clear shield was taped over the eye. The patient was taken to the post-operative care unit in good condition, having tolerated the procedure well.  Post-Op Instructions: [facility] and will receive all other orders and instructions.

## 2020-06-13 NOTE — Transfer of Care (Signed)
Immediate Anesthesia Transfer of Care Note  Patient: Terry Stevens  Procedure(s) Performed: CATARACT EXTRACTION PHACO AND INTRAOCULAR LENS PLACEMENT RIGHT EYE (Right Eye)  Patient Location: PACU  Anesthesia Type:MAC  Level of Consciousness: awake, alert  and patient cooperative  Airway & Oxygen Therapy: Patient Spontanous Breathing  Post-op Assessment: Report given to RN, Post -op Vital signs reviewed and stable and Patient moving all extremities X 4  Post vital signs: Reviewed and stable  Last Vitals:  Vitals Value Taken Time  BP    Temp    Pulse    Resp    SpO2      Last Pain:  Vitals:   06/13/20 0715  PainSc: 0-No pain         Complications: No complications documented.

## 2020-06-13 NOTE — Anesthesia Postprocedure Evaluation (Signed)
Anesthesia Post Note  Patient: Terry Stevens  Procedure(s) Performed: CATARACT EXTRACTION PHACO AND INTRAOCULAR LENS PLACEMENT RIGHT EYE (Right Eye)  Patient location during evaluation: Phase II Anesthesia Type: MAC Level of consciousness: awake and awake and alert Pain management: satisfactory to patient Vital Signs Assessment: post-procedure vital signs reviewed and stable Respiratory status: spontaneous breathing Cardiovascular status: stable Postop Assessment: no apparent nausea or vomiting Anesthetic complications: no   No complications documented.   Last Vitals:  Vitals:   06/13/20 0715 06/13/20 0730  BP: (!) 159/82   Pulse: 60 (!) 55  Resp: 18 17  Temp: 36.6 C   SpO2: 99% 97%    Last Pain:  Vitals:   06/13/20 0715  PainSc: 0-No pain                 Jeni Duling

## 2020-06-16 ENCOUNTER — Encounter (HOSPITAL_COMMUNITY): Payer: Self-pay | Admitting: Ophthalmology

## 2020-06-18 NOTE — Patient Instructions (Signed)
Terry Stevens  06/18/2020     @PREFPERIOPPHARMACY @   Your procedure is scheduled on   06/27/2020.   Report to 06/29/2020 at  1130  A.M.   Call this number if you have problems the morning of surgery:  [phone]   Remember:  Do not eat or drink after midnight.                        Take these medicines the morning of surgery with A SIP OF WATER  Allopurinol, amlodipine, namenda.  Take 1/2 of your usual night time insulin dose.  DO NOT take any medications for diabetes the morning of your procedure.     Please brush your teeth.  Do not wear jewelry, make-up or nail polish.  Do not wear lotions, powders, or perfumes, or deodorant.  Do not shave 48 hours prior to surgery.  Men may shave face and neck.  Do not bring valuables to the hospital.  [facility] is not responsible for any belongings or valuables.  Contacts, dentures or bridgework may not be worn into surgery.  Leave your suitcase in the car.  After surgery it may be brought to your room.  For patients admitted to the hospital, discharge time will be determined by your treatment team.  Patients discharged the day of surgery will not be allowed to drive home and must have someone with them for 24 hours.   Special instructions:  DO NOT smoke tobacco or vape for 24 hours before your procedure.  Please read over the following fact sheets that you were given. Care and Recovery After Surgery      Cataract Surgery, Care After This sheet gives you information about how to care for yourself after your procedure. Your health care provider may also give you more specific instructions. If you have problems or questions, contact your health care provider. What can I expect after the procedure? After the procedure, it is common to have:  Itching.  Discomfort.  Fluid discharge.  Sensitivity to light and to touch.  Bruising in or around the eye.  Mild blurred vision. Follow these instructions at home: Eye  care  Do not touch or rub your eyes.  Protect your eyes as told by your health care provider. You may be told to wear a protective eye shield or sunglasses.  Do not put a contact lens into the affected eye or eyes until your health care provider approves.  Keep the area around your eye clean and dry: ? Avoid swimming. ? Do not allow water to hit you directly in the face while showering. ? Keep soap and shampoo out of your eyes.  Check your eye every day for signs of infection. Watch for: ? Redness, swelling, or pain. ? Fluid, blood, or pus. ? Warmth. ? A bad smell. ? Vision that is getting worse. ? Sensitivity that is getting worse.   Activity  Do not drive for 24 hours if you were given a sedative during your procedure.  Avoid strenuous activities, such as playing contact sports, for as long as told by your health care provider.  Do not drive or use heavy machinery until your health care provider approves.  Do not bend or lift heavy objects. Bending increases pressure in the eye. You can walk, climb stairs, and do light household chores.  Ask your health care provider when you can return to work. If you work in a dusty environment, you  may be advised to wear protective eyewear for a period of time. General instructions  Take or apply over-the-counter and prescription medicines only as told by your health care provider. This includes eye drops.  Keep all follow-up visits as told by your health care provider. This is important. Contact a health care provider if:  You have increased bruising around your eye.  You have pain that is not helped with medicine.  You have a fever.  You have redness, swelling, or pain in your eye.  You have fluid, blood, or pus coming from your incision.  Your vision gets worse.  Your sensitivity to light gets worse. Get help right away if:  You have sudden loss of vision.  You see flashes of light or spots (floaters).  You have severe  eye pain.  You develop nausea or vomiting. Summary  After your procedure, it is common to have itching, discomfort, bruising, fluid discharge, or sensitivity to light.  Follow instructions from your health care provider about caring for your eye after the procedure.  Do not rub your eye after the procedure. You may need to wear eye protection or sunglasses. Do not wear contact lenses. Keep the area around your eye clean and dry.  Avoid activities that require a lot of effort. These include playing sports and lifting heavy objects.  Contact a health care provider if you have increased bruising, pain that does not go away, or a fever. Get help right away if you suddenly lose your vision, see flashes of light or spots, or have severe pain in the eye. This information is not intended to replace advice given to you by your health care provider. Make sure you discuss any questions you have with your health care provider. Document Revised: 11/21/2018 Document Reviewed: 07/25/2017 Elsevier Patient Education  2021 [employer].

## 2020-06-23 ENCOUNTER — Other Ambulatory Visit: Payer: Self-pay

## 2020-06-23 ENCOUNTER — Encounter (HOSPITAL_COMMUNITY): Payer: Self-pay

## 2020-06-23 ENCOUNTER — Encounter (HOSPITAL_COMMUNITY)
Admit: 2020-06-23 | Discharge: 2020-06-23 | Disposition: A | Discharge status: Home / Self Care | Payer: Medicare HMO | Attending: Ophthalmology | Admitting: Ophthalmology

## 2020-06-23 DIAGNOSIS — H2522 Age-related cataract, morgagnian type, left eye: Secondary | ICD-10-CM | POA: Diagnosis not present

## 2020-06-23 NOTE — H&P (Signed)
Surgical History & Physical  Patient Name: Daymian Lill DOB: 07-12-1945  Surgery: Cataract extraction with intraocular lens implant phacoemulsification; Left Eye  Surgeon: Fabio Pierce MD Surgery Date:  06/27/2020 Pre-Op Date:  06/19/2020  HPI: A 66 Yr. old male patient 1. 1. The patient complains of difficulty when driving at night, which began 1 year ago. The left eye is affected. The episode is gradual. The condition's severity increased since last visit. Symptoms occur when the patient is driving and outside. This is negatively affecting the patient's quality of life. 2. The patient is returning after cataract post-op. The right eye is affected. Status post cataract post-op, which began 1 week ago: Since the last visit, the affected area is doing well. The patient's vision is improved. Patient is following medication instructions. HPI Completed by Dr. Fabio Pierce  Medical History: Glaucoma Cataracts Diabetes High Blood Pressure Hypercholesterolemia  Review of Systems Negative Allergic/Immunologic Negative Cardiovascular Negative Constitutional Negative Ear, Nose, Mouth & Throat Negative Endocrine Negative Eyes Negative Gastrointestinal Negative Genitourinary Negative Hemotologic/Lymphatic Negative Integumentary Negative Musculoskeletal Negative Neurological Negative Psychiatry Negative Respiratory  Social   Never smoked   Medication Prednisolone-gatiflox-bromfenac,  Allopurinol, Amiloride-HCTZ, Atorvastatin, Donepezil, Furosemide, Hydralazine, Terazosin HCl, Levemir,   Sx/Procedures Phaco c IOL OD,  Knee Replacement,   Drug Allergies   NKDA  History & Physical: Heent:  Cataract, Left eye NECK: supple without bruits LUNGS: lungs clear to auscultation CV: regular rate and rhythm Abdomen: soft and non-tender  Impression & Plan: Assessment: 1.  CATARACT EXTRACTION STATUS; Right Eye (Z98.41) 2.  INTRAOCULAR LENS IOL (Z96.1) 3.  NUCLEAR SCLEROSIS AGE  RELATED; , Left Eye (H25.12)  Plan: 1.  1 week after cataract surgery. Doing well with improved vision and normal eye pressure. Call with any problems or concerns. Continue Pred-Moxi-Brom 2x/day for 3 more weeks. 2.  Doing well since surgery Continue Post-op medications 3.  Cataract accounts for the patient's decreased vision. This visual impairment is not correctable with a tolerable change in glasses or contact lenses. Cataract surgery with an implantation of a new lens should significantly improve the visual and functional status of the patient. Discussed all risks, benefits, alternatives, and potential complications. Discussed the procedures and recovery. Patient desires to have surgery. A-scan ordered and performed today for intra-ocular lens calculations. The surgery will be performed in order to improve vision for driving, reading, and for eye examinations. Recommend phacoemulsification with intra-ocular lens. Recommend Dextenza for post-operative pain and inflammation. Left Eye. Surgery required to correct imbalance of vision. Dilates well - shugarcaine by protocol.

## 2020-06-25 ENCOUNTER — Other Ambulatory Visit (HOSPITAL_COMMUNITY)
Admission: RE | Admit: 2020-06-25 | Discharge: 2020-06-25 | Disposition: A | Discharge status: Home / Self Care | Payer: Medicare HMO | Source: Ambulatory Visit | Attending: Ophthalmology | Admitting: Ophthalmology

## 2020-06-25 ENCOUNTER — Other Ambulatory Visit: Payer: Self-pay

## 2020-06-25 DIAGNOSIS — Z20822 Contact with and (suspected) exposure to covid-19: Secondary | ICD-10-CM | POA: Insufficient documentation

## 2020-06-25 DIAGNOSIS — Z01812 Encounter for preprocedural laboratory examination: Secondary | ICD-10-CM | POA: Diagnosis not present

## 2020-06-25 LAB — SARS CORONAVIRUS 2 (TAT 6-24 HRS): SARS Coronavirus 2: NEGATIVE

## 2020-06-27 ENCOUNTER — Ambulatory Visit (HOSPITAL_COMMUNITY): Payer: Medicare HMO | Admitting: Certified Registered"

## 2020-06-27 ENCOUNTER — Ambulatory Visit (HOSPITAL_COMMUNITY)
Admission: RE | Admit: 2020-06-27 | Discharge: 2020-06-27 | Disposition: A | Discharge status: Home / Self Care | Payer: Medicare HMO | Source: Ambulatory Visit | Attending: Ophthalmology | Admitting: Ophthalmology

## 2020-06-27 ENCOUNTER — Encounter (HOSPITAL_COMMUNITY)
Admission: RE | Disposition: A | Discharge status: Home / Self Care | Payer: Self-pay | Source: Ambulatory Visit | Attending: Ophthalmology

## 2020-06-27 DIAGNOSIS — H2512 Age-related nuclear cataract, left eye: Secondary | ICD-10-CM | POA: Diagnosis not present

## 2020-06-27 DIAGNOSIS — Z79899 Other long term (current) drug therapy: Secondary | ICD-10-CM | POA: Insufficient documentation

## 2020-06-27 DIAGNOSIS — Z9841 Cataract extraction status, right eye: Secondary | ICD-10-CM | POA: Diagnosis not present

## 2020-06-27 DIAGNOSIS — H42 Glaucoma in diseases classified elsewhere: Secondary | ICD-10-CM | POA: Diagnosis not present

## 2020-06-27 DIAGNOSIS — I10 Essential (primary) hypertension: Secondary | ICD-10-CM | POA: Insufficient documentation

## 2020-06-27 DIAGNOSIS — H409 Unspecified glaucoma: Secondary | ICD-10-CM | POA: Diagnosis not present

## 2020-06-27 DIAGNOSIS — Z961 Presence of intraocular lens: Secondary | ICD-10-CM | POA: Diagnosis not present

## 2020-06-27 DIAGNOSIS — H2522 Age-related cataract, morgagnian type, left eye: Secondary | ICD-10-CM | POA: Diagnosis not present

## 2020-06-27 DIAGNOSIS — E1136 Type 2 diabetes mellitus with diabetic cataract: Secondary | ICD-10-CM | POA: Insufficient documentation

## 2020-06-27 DIAGNOSIS — F0390 Unspecified dementia without behavioral disturbance: Secondary | ICD-10-CM | POA: Diagnosis not present

## 2020-06-27 DIAGNOSIS — E1139 Type 2 diabetes mellitus with other diabetic ophthalmic complication: Secondary | ICD-10-CM | POA: Insufficient documentation

## 2020-06-27 HISTORY — PX: CATARACT EXTRACTION W/PHACO: SHX586

## 2020-06-27 LAB — GLUCOSE, CAPILLARY: Glucose-Capillary: 100 mg/dL — ABNORMAL HIGH (ref 70–99)

## 2020-06-27 SURGERY — PHACOEMULSIFICATION, CATARACT, WITH IOL INSERTION
Anesthesia: Monitor Anesthesia Care | Site: Eye | Laterality: Left

## 2020-06-27 MED ORDER — EPINEPHRINE PF 1 MG/ML IJ SOLN
INTRAOCULAR | Status: DC | PRN
  Administered 2020-06-27: 500 mL

## 2020-06-27 MED ORDER — SODIUM HYALURONATE 10 MG/ML IO SOLUTION
PREFILLED_SYRINGE | INTRAOCULAR | Status: DC | PRN
  Administered 2020-06-27: 0.85 mL via INTRAOCULAR

## 2020-06-27 MED ORDER — TROPICAMIDE 1 % OP SOLN
1.0000 [drp] | OPHTHALMIC | Status: AC
  Administered 2020-06-27 (×3): 1 [drp] via OPHTHALMIC

## 2020-06-27 MED ORDER — STERILE WATER FOR IRRIGATION IR SOLN
Status: DC | PRN
  Administered 2020-06-27: 250 mL

## 2020-06-27 MED ORDER — LIDOCAINE HCL (PF) 1 % IJ SOLN
INTRAOCULAR | Status: DC | PRN
  Administered 2020-06-27: 1 mL via OPHTHALMIC

## 2020-06-27 MED ORDER — SODIUM HYALURONATE 23MG/ML IO SOSY
PREFILLED_SYRINGE | INTRAOCULAR | Status: DC | PRN
  Administered 2020-06-27: 0.6 mL via INTRAOCULAR

## 2020-06-27 MED ORDER — PHENYLEPHRINE HCL 2.5 % OP SOLN
1.0000 [drp] | OPHTHALMIC | Status: AC | PRN
  Administered 2020-06-27 (×3): 1 [drp] via OPHTHALMIC

## 2020-06-27 MED ORDER — TETRACAINE HCL 0.5 % OP SOLN
1.0000 [drp] | OPHTHALMIC | Status: AC | PRN
  Administered 2020-06-27 (×3): 1 [drp] via OPHTHALMIC

## 2020-06-27 MED ORDER — BSS IO SOLN
INTRAOCULAR | Status: DC | PRN
  Administered 2020-06-27: 15 mL via INTRAOCULAR

## 2020-06-27 MED ORDER — NEOMYCIN-POLYMYXIN-DEXAMETH 3.5-10000-0.1 OP SUSP
OPHTHALMIC | Status: DC | PRN
  Administered 2020-06-27: 1 [drp] via OPHTHALMIC

## 2020-06-27 MED ORDER — EPINEPHRINE PF 1 MG/ML IJ SOLN
INTRAMUSCULAR | Status: AC
  Filled 2020-06-27: qty 2

## 2020-06-27 MED ORDER — LIDOCAINE HCL 3.5 % OP GEL
1.0000 "application " | Freq: Once | OPHTHALMIC | Status: AC
  Administered 2020-06-27: 1 via OPHTHALMIC

## 2020-06-27 MED ORDER — POVIDONE-IODINE 5 % OP SOLN
OPHTHALMIC | Status: DC | PRN
  Administered 2020-06-27: 1 via OPHTHALMIC

## 2020-06-27 SURGICAL SUPPLY — 12 items
CLOTH BEACON ORANGE TIMEOUT ST (SAFETY) ×2 IMPLANT
EYE SHIELD UNIVERSAL CLEAR (GAUZE/BANDAGES/DRESSINGS) ×2 IMPLANT
GLOVE SURG UNDER POLY LF SZ6.5 (GLOVE) ×2 IMPLANT
GLOVE SURG UNDER POLY LF SZ7 (GLOVE) ×2 IMPLANT
NEEDLE HYPO 18GX1.5 BLUNT FILL (NEEDLE) ×2 IMPLANT
PAD ARMBOARD 7.5X6 YLW CONV (MISCELLANEOUS) ×2 IMPLANT
SYR TB 1ML LL NO SAFETY (SYRINGE) ×2 IMPLANT
TAPE SURG TRANSPORE 1 IN (GAUZE/BANDAGES/DRESSINGS) ×1 IMPLANT
TAPE SURGICAL TRANSPORE 1 IN (GAUZE/BANDAGES/DRESSINGS) ×1
TECNIS 1 PIECE IOL (Intraocular Lens) ×2 IMPLANT
VISCOELASTIC ADDITIONAL (OPHTHALMIC RELATED) IMPLANT
WATER STERILE IRR 250ML POUR (IV SOLUTION) ×2 IMPLANT

## 2020-06-27 NOTE — Anesthesia Postprocedure Evaluation (Signed)
Anesthesia Post Note  Patient: Terry Stevens  Procedure(s) Performed: CATARACT EXTRACTION PHACO AND INTRAOCULAR LENS PLACEMENT LEFT EYE (Left Eye)  Anesthesia Type: MAC Anesthetic complications: no   No complications documented.   Last Vitals:  Vitals:   06/27/20 1015 06/27/20 1120  BP: (!) 183/81 (!) 187/71  Pulse: (!) 58 63  Resp: 18 18  Temp:  (!) 36.4 C  SpO2: 97% 97%    Last Pain:  Vitals:   06/27/20 1120  TempSrc: Oral  PainSc: 0-No pain                 Louann Sjogren

## 2020-06-27 NOTE — Discharge Instructions (Signed)
Please discharge patient when stable, will follow up today with Dr. Paiden Caraveo at the [facility] [place] office immediately following discharge.  Leave shield in place until visit.  All paperwork with discharge instructions will be given at the office.  [facility] [place] Address:  [address]  [place], [place] [address]  

## 2020-06-27 NOTE — Transfer of Care (Signed)
Immediate Anesthesia Transfer of Care Note  Patient: Terry Stevens  Procedure(s) Performed: CATARACT EXTRACTION PHACO AND INTRAOCULAR LENS PLACEMENT LEFT EYE (Left Eye)  Patient Location: Short Stay  Anesthesia Type:MAC  Level of Consciousness: awake, alert  and oriented  Airway & Oxygen Therapy: Patient Spontanous Breathing  Post-op Assessment: Report given to RN, Post -op Vital signs reviewed and stable and Patient moving all extremities X 4  Post vital signs: Reviewed and stable  Last Vitals:  Vitals Value Taken Time  BP    Temp    Pulse    Resp    SpO2      Last Pain:  Vitals:   06/27/20 1010  PainSc: 0-No pain         Complications: No complications documented.

## 2020-06-27 NOTE — Anesthesia Procedure Notes (Signed)
Procedure Name: MAC Date/Time: 06/27/2020 11:00 AM Performed by: Orlie Dakin, CRNA Pre-anesthesia Checklist: Patient identified, Emergency Drugs available, Suction available and Patient being monitored Patient Re-evaluated:Patient Re-evaluated prior to induction Oxygen Delivery Method: Nasal cannula Placement Confirmation: positive ETCO2

## 2020-06-27 NOTE — Anesthesia Preprocedure Evaluation (Signed)
Anesthesia Evaluation  Patient identified by MRN, date of birth, ID band Patient awake    Reviewed: Allergy & Precautions, NPO status , Patient's Chart, lab work & pertinent test results  History of Anesthesia Complications (+) history of anesthetic complications  Airway Mallampati: III  TM Distance: >3 FB Neck ROM: Full    Dental  (+) Upper Dentures, Lower Dentures   Pulmonary former smoker,    Pulmonary exam normal breath sounds clear to auscultation       Cardiovascular Exercise Tolerance: Good hypertension, Pt. on medications Normal cardiovascular exam Rhythm:Regular Rate:Normal     Neuro/Psych Dementia    GI/Hepatic GERD  Medicated and Controlled,  Endo/Other  diabetes, Well Controlled, Type 2, Insulin Dependent  Renal/GU      Musculoskeletal  (+) Arthritis ,   Abdominal   Peds  Hematology  (+) Blood dyscrasia, anemia ,   Anesthesia Other Findings   Reproductive/Obstetrics                             Anesthesia Physical  Anesthesia Plan  ASA: II  Anesthesia Plan: MAC   Post-op Pain Management:    Induction:   PONV Risk Score and Plan:   Airway Management Planned: Nasal Cannula and Natural Airway  Additional Equipment:   Intra-op Plan:   Post-operative Plan:   Informed Consent: I have reviewed the patients History and Physical, chart, labs and discussed the procedure including the risks, benefits and alternatives for the proposed anesthesia with the patient or authorized representative who has indicated his/her understanding and acceptance.       Plan Discussed with: CRNA and Surgeon  Anesthesia Plan Comments:         Anesthesia Quick Evaluation

## 2020-06-27 NOTE — Op Note (Signed)
Date of procedure: 06/27/20  Pre-operative diagnosis: Visually significant age-related nuclear cataract, Left Eye (H25.12)  Post-operative diagnosis: Visually significant age-related nuclear cataract, Left Eye  Procedure: Removal of cataract via phacoemulsification and insertion of intra-ocular lens Johnson and Greenwood  +21.0D into the capsular bag of the Left Eye  Attending surgeon: Gerda Diss. Derek Laughter, MD, MA  Anesthesia: MAC, Topical Akten  Complications: None  Estimated Blood Loss: <6m (minimal)  Specimens: None  Implants: As above  Indications:  Visually significant age-related cataract, Left Eye  Procedure:  The patient was seen and identified in the pre-operative area. The operative eye was identified and dilated.  The operative eye was marked.  Topical anesthesia was administered to the operative eye.     The patient was then to the operative suite and placed in the supine position.  A timeout was performed confirming the patient, procedure to be performed, and all other relevant information.   The patient's face was prepped and draped in the usual fashion for intra-ocular surgery.  A lid speculum was placed into the operative eye and the surgical microscope moved into place and focused.  An inferotemporal paracentesis was created using a 20 gauge paracentesis blade.  Shugarcaine was injected into the anterior chamber.  Viscoelastic was injected into the anterior chamber.  A temporal clear-corneal main wound incision was created using a 2.465mmicrokeratome.  A continuous curvilinear capsulorrhexis was initiated using an irrigating cystitome and completed using capsulorrhexis forceps.  Hydrodissection and hydrodeliniation were performed.  Viscoelastic was injected into the anterior chamber.  A phacoemulsification handpiece and a chopper as a second instrument were used to remove the nucleus and epinucleus. The irrigation/aspiration handpiece was used to remove any remaining  cortical material.   The capsular bag was reinflated with viscoelastic, checked, and found to be intact.  The intraocular lens was inserted into the capsular bag.  The irrigation/aspiration handpiece was used to remove any remaining viscoelastic.  The clear corneal wound and paracentesis wounds were then hydrated and checked with Weck-Cels to be watertight.  The lid-speculum and drape was removed, and the patient's face was cleaned with a wet and dry 4x4.  Maxitrol was instilled in the eye before a clear shield was taped over the eye. The patient was taken to the post-operative care unit in good condition, having tolerated the procedure well.  Post-Op Instructions: [facility] and will receive all other orders and instructions.

## 2020-06-27 NOTE — Interval H&P Note (Signed)
History and Physical Interval Note:  06/27/2020 10:53 AM  [name]  has presented today for surgery, with the diagnosis of Nuclear sclerotic cataract - Left eye.  The various methods of treatment have been discussed with the patient and family. After consideration of risks, benefits and other options for treatment, the patient has consented to  Procedure(s) with comments: CATARACT EXTRACTION PHACO AND INTRAOCULAR LENS PLACEMENT LEFT EYE (Left) - left,  as a surgical intervention.  The patient's history has been reviewed, patient examined, no change in status, stable for surgery.  I have reviewed the patient's chart and labs.  Questions were answered to the patient's satisfaction.     Terry Stevens

## 2020-06-30 ENCOUNTER — Encounter (HOSPITAL_COMMUNITY): Payer: Self-pay | Admitting: Ophthalmology

## 2020-07-04 DIAGNOSIS — U071 COVID-19: Secondary | ICD-10-CM | POA: Diagnosis not present

## 2020-07-04 DIAGNOSIS — Z20822 Contact with and (suspected) exposure to covid-19: Secondary | ICD-10-CM | POA: Diagnosis not present

## 2020-07-17 DIAGNOSIS — Z0100 Encounter for examination of eyes and vision without abnormal findings: Secondary | ICD-10-CM | POA: Diagnosis not present

## 2020-09-10 DIAGNOSIS — Z6832 Body mass index (BMI) 32.0-32.9, adult: Secondary | ICD-10-CM | POA: Diagnosis not present

## 2020-09-10 DIAGNOSIS — M1009 Idiopathic gout, multiple sites: Secondary | ICD-10-CM | POA: Diagnosis not present

## 2020-09-10 DIAGNOSIS — F0151 Vascular dementia with behavioral disturbance: Secondary | ICD-10-CM | POA: Diagnosis not present

## 2020-09-10 DIAGNOSIS — K219 Gastro-esophageal reflux disease without esophagitis: Secondary | ICD-10-CM | POA: Diagnosis not present

## 2020-09-10 DIAGNOSIS — E1169 Type 2 diabetes mellitus with other specified complication: Secondary | ICD-10-CM | POA: Diagnosis not present

## 2020-09-10 DIAGNOSIS — Z0000 Encounter for general adult medical examination without abnormal findings: Secondary | ICD-10-CM | POA: Diagnosis not present

## 2020-09-10 DIAGNOSIS — E782 Mixed hyperlipidemia: Secondary | ICD-10-CM | POA: Diagnosis not present

## 2020-09-10 DIAGNOSIS — I5032 Chronic diastolic (congestive) heart failure: Secondary | ICD-10-CM | POA: Diagnosis not present

## 2020-11-07 DIAGNOSIS — E1169 Type 2 diabetes mellitus with other specified complication: Secondary | ICD-10-CM | POA: Diagnosis not present

## 2020-11-07 DIAGNOSIS — I5032 Chronic diastolic (congestive) heart failure: Secondary | ICD-10-CM | POA: Diagnosis not present

## 2020-11-07 DIAGNOSIS — F0151 Vascular dementia with behavioral disturbance: Secondary | ICD-10-CM | POA: Diagnosis not present

## 2020-11-07 DIAGNOSIS — E782 Mixed hyperlipidemia: Secondary | ICD-10-CM | POA: Diagnosis not present

## 2020-11-07 DIAGNOSIS — K219 Gastro-esophageal reflux disease without esophagitis: Secondary | ICD-10-CM | POA: Diagnosis not present

## 2020-12-08 DIAGNOSIS — I10 Essential (primary) hypertension: Secondary | ICD-10-CM | POA: Diagnosis not present

## 2020-12-08 DIAGNOSIS — K219 Gastro-esophageal reflux disease without esophagitis: Secondary | ICD-10-CM | POA: Diagnosis not present

## 2020-12-08 DIAGNOSIS — E1169 Type 2 diabetes mellitus with other specified complication: Secondary | ICD-10-CM | POA: Diagnosis not present

## 2020-12-08 DIAGNOSIS — E782 Mixed hyperlipidemia: Secondary | ICD-10-CM | POA: Diagnosis not present

## 2020-12-15 DIAGNOSIS — I5032 Chronic diastolic (congestive) heart failure: Secondary | ICD-10-CM | POA: Diagnosis not present

## 2020-12-15 DIAGNOSIS — E1169 Type 2 diabetes mellitus with other specified complication: Secondary | ICD-10-CM | POA: Diagnosis not present

## 2020-12-15 DIAGNOSIS — Z0000 Encounter for general adult medical examination without abnormal findings: Secondary | ICD-10-CM | POA: Diagnosis not present

## 2020-12-15 DIAGNOSIS — Z6832 Body mass index (BMI) 32.0-32.9, adult: Secondary | ICD-10-CM | POA: Diagnosis not present

## 2020-12-15 DIAGNOSIS — I10 Essential (primary) hypertension: Secondary | ICD-10-CM | POA: Diagnosis not present

## 2020-12-15 DIAGNOSIS — M1009 Idiopathic gout, multiple sites: Secondary | ICD-10-CM | POA: Diagnosis not present

## 2020-12-15 DIAGNOSIS — K219 Gastro-esophageal reflux disease without esophagitis: Secondary | ICD-10-CM | POA: Diagnosis not present

## 2020-12-15 DIAGNOSIS — E782 Mixed hyperlipidemia: Secondary | ICD-10-CM | POA: Diagnosis not present

## 2020-12-19 DIAGNOSIS — E1169 Type 2 diabetes mellitus with other specified complication: Secondary | ICD-10-CM | POA: Diagnosis not present

## 2020-12-30 DIAGNOSIS — M810 Age-related osteoporosis without current pathological fracture: Secondary | ICD-10-CM | POA: Diagnosis not present

## 2020-12-30 DIAGNOSIS — Z13820 Encounter for screening for osteoporosis: Secondary | ICD-10-CM | POA: Diagnosis not present

## 2021-01-07 DIAGNOSIS — E7849 Other hyperlipidemia: Secondary | ICD-10-CM | POA: Diagnosis not present

## 2021-01-07 DIAGNOSIS — I10 Essential (primary) hypertension: Secondary | ICD-10-CM | POA: Diagnosis not present

## 2021-01-07 DIAGNOSIS — E1169 Type 2 diabetes mellitus with other specified complication: Secondary | ICD-10-CM | POA: Diagnosis not present

## 2021-01-12 DIAGNOSIS — Z6833 Body mass index (BMI) 33.0-33.9, adult: Secondary | ICD-10-CM | POA: Diagnosis not present

## 2021-01-12 DIAGNOSIS — N62 Hypertrophy of breast: Secondary | ICD-10-CM | POA: Diagnosis not present

## 2021-01-12 DIAGNOSIS — M5459 Other low back pain: Secondary | ICD-10-CM | POA: Diagnosis not present

## 2021-02-18 DIAGNOSIS — N62 Hypertrophy of breast: Secondary | ICD-10-CM | POA: Diagnosis not present

## 2021-02-18 DIAGNOSIS — R922 Inconclusive mammogram: Secondary | ICD-10-CM | POA: Diagnosis not present

## 2021-04-07 DIAGNOSIS — N1832 Chronic kidney disease, stage 3b: Secondary | ICD-10-CM | POA: Diagnosis not present

## 2021-04-07 DIAGNOSIS — I10 Essential (primary) hypertension: Secondary | ICD-10-CM | POA: Diagnosis not present

## 2021-04-07 DIAGNOSIS — K219 Gastro-esophageal reflux disease without esophagitis: Secondary | ICD-10-CM | POA: Diagnosis not present

## 2021-04-07 DIAGNOSIS — E1169 Type 2 diabetes mellitus with other specified complication: Secondary | ICD-10-CM | POA: Diagnosis not present

## 2021-04-07 DIAGNOSIS — M1009 Idiopathic gout, multiple sites: Secondary | ICD-10-CM | POA: Diagnosis not present

## 2021-04-07 DIAGNOSIS — E782 Mixed hyperlipidemia: Secondary | ICD-10-CM | POA: Diagnosis not present

## 2021-04-13 DIAGNOSIS — M1009 Idiopathic gout, multiple sites: Secondary | ICD-10-CM | POA: Diagnosis not present

## 2021-04-13 DIAGNOSIS — E1169 Type 2 diabetes mellitus with other specified complication: Secondary | ICD-10-CM | POA: Diagnosis not present

## 2021-04-13 DIAGNOSIS — Z6833 Body mass index (BMI) 33.0-33.9, adult: Secondary | ICD-10-CM | POA: Diagnosis not present

## 2021-04-13 DIAGNOSIS — N1832 Chronic kidney disease, stage 3b: Secondary | ICD-10-CM | POA: Diagnosis not present

## 2021-04-13 DIAGNOSIS — I10 Essential (primary) hypertension: Secondary | ICD-10-CM | POA: Diagnosis not present

## 2021-04-13 DIAGNOSIS — K219 Gastro-esophageal reflux disease without esophagitis: Secondary | ICD-10-CM | POA: Diagnosis not present

## 2021-04-13 DIAGNOSIS — E782 Mixed hyperlipidemia: Secondary | ICD-10-CM | POA: Diagnosis not present

## 2021-07-08 DIAGNOSIS — N1832 Chronic kidney disease, stage 3b: Secondary | ICD-10-CM | POA: Diagnosis not present

## 2021-07-08 DIAGNOSIS — M1009 Idiopathic gout, multiple sites: Secondary | ICD-10-CM | POA: Diagnosis not present

## 2021-07-08 DIAGNOSIS — K219 Gastro-esophageal reflux disease without esophagitis: Secondary | ICD-10-CM | POA: Diagnosis not present

## 2021-07-08 DIAGNOSIS — E1169 Type 2 diabetes mellitus with other specified complication: Secondary | ICD-10-CM | POA: Diagnosis not present

## 2021-07-08 DIAGNOSIS — E782 Mixed hyperlipidemia: Secondary | ICD-10-CM | POA: Diagnosis not present

## 2021-07-08 DIAGNOSIS — I10 Essential (primary) hypertension: Secondary | ICD-10-CM | POA: Diagnosis not present

## 2021-07-27 DIAGNOSIS — Z125 Encounter for screening for malignant neoplasm of prostate: Secondary | ICD-10-CM | POA: Diagnosis not present

## 2021-07-27 DIAGNOSIS — Z0000 Encounter for general adult medical examination without abnormal findings: Secondary | ICD-10-CM | POA: Diagnosis not present

## 2021-07-27 DIAGNOSIS — E1169 Type 2 diabetes mellitus with other specified complication: Secondary | ICD-10-CM | POA: Diagnosis not present

## 2021-07-27 DIAGNOSIS — Z6834 Body mass index (BMI) 34.0-34.9, adult: Secondary | ICD-10-CM | POA: Diagnosis not present

## 2021-07-27 DIAGNOSIS — I10 Essential (primary) hypertension: Secondary | ICD-10-CM | POA: Diagnosis not present

## 2021-07-27 DIAGNOSIS — M1009 Idiopathic gout, multiple sites: Secondary | ICD-10-CM | POA: Diagnosis not present

## 2021-07-27 DIAGNOSIS — I5031 Acute diastolic (congestive) heart failure: Secondary | ICD-10-CM | POA: Diagnosis not present

## 2021-07-27 DIAGNOSIS — N521 Erectile dysfunction due to diseases classified elsewhere: Secondary | ICD-10-CM | POA: Diagnosis not present

## 2021-10-08 DIAGNOSIS — E1169 Type 2 diabetes mellitus with other specified complication: Secondary | ICD-10-CM | POA: Diagnosis not present

## 2021-10-08 DIAGNOSIS — N521 Erectile dysfunction due to diseases classified elsewhere: Secondary | ICD-10-CM | POA: Diagnosis not present

## 2021-10-08 DIAGNOSIS — M1009 Idiopathic gout, multiple sites: Secondary | ICD-10-CM | POA: Diagnosis not present

## 2021-10-08 DIAGNOSIS — I10 Essential (primary) hypertension: Secondary | ICD-10-CM | POA: Diagnosis not present

## 2021-10-08 DIAGNOSIS — I5031 Acute diastolic (congestive) heart failure: Secondary | ICD-10-CM | POA: Diagnosis not present

## 2021-11-04 DIAGNOSIS — Z0000 Encounter for general adult medical examination without abnormal findings: Secondary | ICD-10-CM | POA: Diagnosis not present

## 2021-11-04 DIAGNOSIS — N1832 Chronic kidney disease, stage 3b: Secondary | ICD-10-CM | POA: Diagnosis not present

## 2021-11-04 DIAGNOSIS — M1009 Idiopathic gout, multiple sites: Secondary | ICD-10-CM | POA: Diagnosis not present

## 2021-11-04 DIAGNOSIS — Z6834 Body mass index (BMI) 34.0-34.9, adult: Secondary | ICD-10-CM | POA: Diagnosis not present

## 2021-11-04 DIAGNOSIS — I5042 Chronic combined systolic (congestive) and diastolic (congestive) heart failure: Secondary | ICD-10-CM | POA: Diagnosis not present

## 2021-11-04 DIAGNOSIS — E1169 Type 2 diabetes mellitus with other specified complication: Secondary | ICD-10-CM | POA: Diagnosis not present

## 2021-11-04 DIAGNOSIS — I10 Essential (primary) hypertension: Secondary | ICD-10-CM | POA: Diagnosis not present

## 2021-11-04 DIAGNOSIS — N521 Erectile dysfunction due to diseases classified elsewhere: Secondary | ICD-10-CM | POA: Diagnosis not present

## 2022-01-25 DIAGNOSIS — H524 Presbyopia: Secondary | ICD-10-CM | POA: Diagnosis not present

## 2022-01-25 DIAGNOSIS — H5203 Hypermetropia, bilateral: Secondary | ICD-10-CM | POA: Diagnosis not present

## 2022-02-10 DIAGNOSIS — M1009 Idiopathic gout, multiple sites: Secondary | ICD-10-CM | POA: Diagnosis not present

## 2022-02-10 DIAGNOSIS — Z0000 Encounter for general adult medical examination without abnormal findings: Secondary | ICD-10-CM | POA: Diagnosis not present

## 2022-02-10 DIAGNOSIS — I5042 Chronic combined systolic (congestive) and diastolic (congestive) heart failure: Secondary | ICD-10-CM | POA: Diagnosis not present

## 2022-02-10 DIAGNOSIS — N184 Chronic kidney disease, stage 4 (severe): Secondary | ICD-10-CM | POA: Diagnosis not present

## 2022-02-10 DIAGNOSIS — N521 Erectile dysfunction due to diseases classified elsewhere: Secondary | ICD-10-CM | POA: Diagnosis not present

## 2022-02-10 DIAGNOSIS — E1169 Type 2 diabetes mellitus with other specified complication: Secondary | ICD-10-CM | POA: Diagnosis not present

## 2022-02-10 DIAGNOSIS — Z79899 Other long term (current) drug therapy: Secondary | ICD-10-CM | POA: Diagnosis not present

## 2022-02-10 DIAGNOSIS — I10 Essential (primary) hypertension: Secondary | ICD-10-CM | POA: Diagnosis not present

## 2022-02-10 DIAGNOSIS — Z6834 Body mass index (BMI) 34.0-34.9, adult: Secondary | ICD-10-CM | POA: Diagnosis not present

## 2022-05-04 DIAGNOSIS — Z6834 Body mass index (BMI) 34.0-34.9, adult: Secondary | ICD-10-CM | POA: Diagnosis not present

## 2022-05-04 DIAGNOSIS — J209 Acute bronchitis, unspecified: Secondary | ICD-10-CM | POA: Diagnosis not present

## 2022-05-11 DIAGNOSIS — I5042 Chronic combined systolic (congestive) and diastolic (congestive) heart failure: Secondary | ICD-10-CM | POA: Diagnosis not present

## 2022-05-11 DIAGNOSIS — N184 Chronic kidney disease, stage 4 (severe): Secondary | ICD-10-CM | POA: Diagnosis not present

## 2022-05-11 DIAGNOSIS — K219 Gastro-esophageal reflux disease without esophagitis: Secondary | ICD-10-CM | POA: Diagnosis not present

## 2022-05-11 DIAGNOSIS — N521 Erectile dysfunction due to diseases classified elsewhere: Secondary | ICD-10-CM | POA: Diagnosis not present

## 2022-05-11 DIAGNOSIS — I10 Essential (primary) hypertension: Secondary | ICD-10-CM | POA: Diagnosis not present

## 2022-05-11 DIAGNOSIS — Z6834 Body mass index (BMI) 34.0-34.9, adult: Secondary | ICD-10-CM | POA: Diagnosis not present

## 2022-05-11 DIAGNOSIS — E1121 Type 2 diabetes mellitus with diabetic nephropathy: Secondary | ICD-10-CM | POA: Diagnosis not present

## 2022-07-09 DIAGNOSIS — E1121 Type 2 diabetes mellitus with diabetic nephropathy: Secondary | ICD-10-CM | POA: Diagnosis not present

## 2022-07-09 DIAGNOSIS — I5042 Chronic combined systolic (congestive) and diastolic (congestive) heart failure: Secondary | ICD-10-CM | POA: Diagnosis not present

## 2022-07-09 DIAGNOSIS — K219 Gastro-esophageal reflux disease without esophagitis: Secondary | ICD-10-CM | POA: Diagnosis not present

## 2022-07-09 DIAGNOSIS — N184 Chronic kidney disease, stage 4 (severe): Secondary | ICD-10-CM | POA: Diagnosis not present

## 2022-07-09 DIAGNOSIS — I10 Essential (primary) hypertension: Secondary | ICD-10-CM | POA: Diagnosis not present

## 2022-08-04 ENCOUNTER — Other Ambulatory Visit: Payer: Self-pay

## 2022-08-04 ENCOUNTER — Other Ambulatory Visit (HOSPITAL_COMMUNITY)
Admission: RE | Admit: 2022-08-04 | Discharge: 2022-08-04 | Disposition: A | Discharge status: Home / Self Care | Payer: No Typology Code available for payment source | Source: Ambulatory Visit | Attending: Internal Medicine | Admitting: Internal Medicine

## 2022-08-04 ENCOUNTER — Ambulatory Visit (HOSPITAL_COMMUNITY)
Admission: RE | Admit: 2022-08-04 | Discharge: 2022-08-04 | Disposition: A | Discharge status: Home / Self Care | Payer: No Typology Code available for payment source | Source: Ambulatory Visit | Attending: Internal Medicine | Admitting: Internal Medicine

## 2022-08-04 DIAGNOSIS — R0602 Shortness of breath: Secondary | ICD-10-CM

## 2022-08-04 DIAGNOSIS — R4182 Altered mental status, unspecified: Secondary | ICD-10-CM | POA: Diagnosis not present

## 2022-08-04 DIAGNOSIS — I5023 Acute on chronic systolic (congestive) heart failure: Secondary | ICD-10-CM | POA: Diagnosis not present

## 2022-08-04 DIAGNOSIS — I10 Essential (primary) hypertension: Secondary | ICD-10-CM | POA: Diagnosis not present

## 2022-08-04 DIAGNOSIS — R918 Other nonspecific abnormal finding of lung field: Secondary | ICD-10-CM | POA: Diagnosis not present

## 2022-08-04 DIAGNOSIS — N184 Chronic kidney disease, stage 4 (severe): Secondary | ICD-10-CM | POA: Diagnosis not present

## 2022-08-04 DIAGNOSIS — Z6834 Body mass index (BMI) 34.0-34.9, adult: Secondary | ICD-10-CM | POA: Diagnosis not present

## 2022-08-04 DIAGNOSIS — E1121 Type 2 diabetes mellitus with diabetic nephropathy: Secondary | ICD-10-CM | POA: Diagnosis not present

## 2022-08-04 DIAGNOSIS — K219 Gastro-esophageal reflux disease without esophagitis: Secondary | ICD-10-CM | POA: Diagnosis not present

## 2022-08-05 LAB — MICROALBUMIN, URINE: Microalb, Ur: 92.5 ug/mL — ABNORMAL HIGH

## 2022-08-08 DIAGNOSIS — N521 Erectile dysfunction due to diseases classified elsewhere: Secondary | ICD-10-CM | POA: Diagnosis not present

## 2022-08-08 DIAGNOSIS — I10 Essential (primary) hypertension: Secondary | ICD-10-CM | POA: Diagnosis not present

## 2022-08-08 DIAGNOSIS — I5023 Acute on chronic systolic (congestive) heart failure: Secondary | ICD-10-CM | POA: Diagnosis not present

## 2022-08-08 DIAGNOSIS — R4182 Altered mental status, unspecified: Secondary | ICD-10-CM | POA: Diagnosis not present

## 2022-08-08 DIAGNOSIS — N184 Chronic kidney disease, stage 4 (severe): Secondary | ICD-10-CM | POA: Diagnosis not present

## 2022-08-08 DIAGNOSIS — K219 Gastro-esophageal reflux disease without esophagitis: Secondary | ICD-10-CM | POA: Diagnosis not present

## 2022-08-08 DIAGNOSIS — I5042 Chronic combined systolic (congestive) and diastolic (congestive) heart failure: Secondary | ICD-10-CM | POA: Diagnosis not present

## 2022-08-08 DIAGNOSIS — E1121 Type 2 diabetes mellitus with diabetic nephropathy: Secondary | ICD-10-CM | POA: Diagnosis not present

## 2022-09-01 DIAGNOSIS — E1121 Type 2 diabetes mellitus with diabetic nephropathy: Secondary | ICD-10-CM | POA: Diagnosis not present

## 2022-09-01 DIAGNOSIS — N184 Chronic kidney disease, stage 4 (severe): Secondary | ICD-10-CM | POA: Diagnosis not present

## 2022-09-01 DIAGNOSIS — Z6834 Body mass index (BMI) 34.0-34.9, adult: Secondary | ICD-10-CM | POA: Diagnosis not present

## 2022-09-01 DIAGNOSIS — I10 Essential (primary) hypertension: Secondary | ICD-10-CM | POA: Diagnosis not present

## 2022-09-01 DIAGNOSIS — I5023 Acute on chronic systolic (congestive) heart failure: Secondary | ICD-10-CM | POA: Diagnosis not present

## 2022-09-01 DIAGNOSIS — Z125 Encounter for screening for malignant neoplasm of prostate: Secondary | ICD-10-CM | POA: Diagnosis not present

## 2022-09-01 DIAGNOSIS — I5021 Acute systolic (congestive) heart failure: Secondary | ICD-10-CM | POA: Diagnosis not present

## 2022-10-08 DIAGNOSIS — N1832 Chronic kidney disease, stage 3b: Secondary | ICD-10-CM | POA: Diagnosis not present

## 2022-10-08 DIAGNOSIS — R809 Proteinuria, unspecified: Secondary | ICD-10-CM | POA: Diagnosis not present

## 2022-10-08 DIAGNOSIS — E1129 Type 2 diabetes mellitus with other diabetic kidney complication: Secondary | ICD-10-CM | POA: Diagnosis not present

## 2022-10-08 DIAGNOSIS — E1122 Type 2 diabetes mellitus with diabetic chronic kidney disease: Secondary | ICD-10-CM | POA: Diagnosis not present

## 2022-10-13 ENCOUNTER — Other Ambulatory Visit (HOSPITAL_COMMUNITY): Payer: Self-pay | Admitting: Nephrology

## 2022-10-13 DIAGNOSIS — E1122 Type 2 diabetes mellitus with diabetic chronic kidney disease: Secondary | ICD-10-CM

## 2022-10-14 ENCOUNTER — Other Ambulatory Visit (HOSPITAL_COMMUNITY)
Admission: RE | Admit: 2022-10-14 | Discharge: 2022-10-14 | Disposition: A | Discharge status: Home / Self Care | Payer: No Typology Code available for payment source | Source: Ambulatory Visit | Attending: Nephrology | Admitting: Nephrology

## 2022-10-14 ENCOUNTER — Ambulatory Visit (HOSPITAL_COMMUNITY): Payer: No Typology Code available for payment source

## 2022-10-14 ENCOUNTER — Encounter (HOSPITAL_COMMUNITY): Payer: Self-pay

## 2022-10-14 DIAGNOSIS — E559 Vitamin D deficiency, unspecified: Secondary | ICD-10-CM | POA: Diagnosis not present

## 2022-10-14 DIAGNOSIS — N189 Chronic kidney disease, unspecified: Secondary | ICD-10-CM | POA: Diagnosis not present

## 2022-10-14 DIAGNOSIS — D638 Anemia in other chronic diseases classified elsewhere: Secondary | ICD-10-CM | POA: Diagnosis not present

## 2022-10-14 DIAGNOSIS — E1129 Type 2 diabetes mellitus with other diabetic kidney complication: Secondary | ICD-10-CM | POA: Insufficient documentation

## 2022-10-14 DIAGNOSIS — D539 Nutritional anemia, unspecified: Secondary | ICD-10-CM | POA: Insufficient documentation

## 2022-10-14 DIAGNOSIS — I5042 Chronic combined systolic (congestive) and diastolic (congestive) heart failure: Secondary | ICD-10-CM | POA: Insufficient documentation

## 2022-10-14 DIAGNOSIS — N1832 Chronic kidney disease, stage 3b: Secondary | ICD-10-CM | POA: Insufficient documentation

## 2022-10-14 DIAGNOSIS — E1122 Type 2 diabetes mellitus with diabetic chronic kidney disease: Secondary | ICD-10-CM | POA: Diagnosis not present

## 2022-10-14 DIAGNOSIS — D7589 Other specified diseases of blood and blood-forming organs: Secondary | ICD-10-CM | POA: Diagnosis not present

## 2022-10-14 DIAGNOSIS — R809 Proteinuria, unspecified: Secondary | ICD-10-CM | POA: Diagnosis not present

## 2022-10-14 LAB — RENAL FUNCTION PANEL
Albumin: 3.3 g/dL — ABNORMAL LOW (ref 3.5–5.0)
Anion gap: 10 (ref 5–15)
BUN: 38 mg/dL — ABNORMAL HIGH (ref 8–23)
CO2: 22 mmol/L (ref 22–32)
Calcium: 8.2 mg/dL — ABNORMAL LOW (ref 8.9–10.3)
Chloride: 101 mmol/L (ref 98–111)
Creatinine, Ser: 3.08 mg/dL — ABNORMAL HIGH (ref 0.61–1.24)
GFR, Estimated: 20 mL/min — ABNORMAL LOW (ref >60)
Glucose, Bld: 254 mg/dL — ABNORMAL HIGH (ref 70–99)
Phosphorus: 3.3 mg/dL (ref 2.5–4.6)
Potassium: 4.3 mmol/L (ref 3.5–5.1)
Sodium: 133 mmol/L — ABNORMAL LOW (ref 135–145)

## 2022-10-14 LAB — URINALYSIS, W/ REFLEX TO CULTURE (INFECTION SUSPECTED)
Bacteria, UA: NONE SEEN
Bilirubin Urine: NEGATIVE
Glucose, UA: NEGATIVE mg/dL
Hgb urine dipstick: NEGATIVE
Ketones, ur: NEGATIVE mg/dL
Leukocytes,Ua: NEGATIVE
Nitrite: NEGATIVE
Protein, ur: NEGATIVE mg/dL
Specific Gravity, Urine: 1.009 (ref 1.005–1.030)
pH: 5 (ref 5.0–8.0)

## 2022-10-14 LAB — CBC
HCT: 35.2 % — ABNORMAL LOW (ref 39.0–52.0)
Hemoglobin: 11 g/dL — ABNORMAL LOW (ref 13.0–17.0)
MCH: 27.7 pg (ref 26.0–34.0)
MCHC: 31.3 g/dL (ref 30.0–36.0)
MCV: 88.7 fL (ref 80.0–100.0)
Platelets: 155 10*3/uL (ref 150–400)
RBC: 3.97 MIL/uL — ABNORMAL LOW (ref 4.22–5.81)
RDW: 15.9 % — ABNORMAL HIGH (ref 11.5–15.5)
WBC: 6 10*3/uL (ref 4.0–10.5)
nRBC: 0 % (ref 0.0–0.2)

## 2022-10-14 LAB — VITAMIN B12: Vitamin B-12: 295 pg/mL (ref 180–914)

## 2022-10-14 LAB — PROTEIN / CREATININE RATIO, URINE
Creatinine, Urine: 112 mg/dL
Protein Creatinine Ratio: 0.06 mg/mg{creat} (ref 0.00–0.15)
Total Protein, Urine: 7 mg/dL

## 2022-10-14 LAB — VITAMIN D 25 HYDROXY (VIT D DEFICIENCY, FRACTURES): Vit D, 25-Hydroxy: 56.74 ng/mL (ref 30–100)

## 2022-10-14 LAB — FOLATE: Folate: 5.6 ng/mL — ABNORMAL LOW (ref >5.9)

## 2022-10-15 LAB — PARATHYROID HORMONE, INTACT (NO CA): PTH: 58 pg/mL (ref 15–65)

## 2022-10-25 ENCOUNTER — Ambulatory Visit: Payer: No Typology Code available for payment source | Admitting: Urology

## 2022-12-01 DIAGNOSIS — K219 Gastro-esophageal reflux disease without esophagitis: Secondary | ICD-10-CM | POA: Diagnosis not present

## 2022-12-01 DIAGNOSIS — I10 Essential (primary) hypertension: Secondary | ICD-10-CM | POA: Diagnosis not present

## 2022-12-01 DIAGNOSIS — Z0000 Encounter for general adult medical examination without abnormal findings: Secondary | ICD-10-CM | POA: Diagnosis not present

## 2022-12-01 DIAGNOSIS — N184 Chronic kidney disease, stage 4 (severe): Secondary | ICD-10-CM | POA: Diagnosis not present

## 2022-12-01 DIAGNOSIS — E1121 Type 2 diabetes mellitus with diabetic nephropathy: Secondary | ICD-10-CM | POA: Diagnosis not present

## 2022-12-01 DIAGNOSIS — Z6833 Body mass index (BMI) 33.0-33.9, adult: Secondary | ICD-10-CM | POA: Diagnosis not present

## 2022-12-01 DIAGNOSIS — I5042 Chronic combined systolic (congestive) and diastolic (congestive) heart failure: Secondary | ICD-10-CM | POA: Diagnosis not present

## 2022-12-01 DIAGNOSIS — N521 Erectile dysfunction due to diseases classified elsewhere: Secondary | ICD-10-CM | POA: Diagnosis not present

## 2022-12-01 DIAGNOSIS — R4182 Altered mental status, unspecified: Secondary | ICD-10-CM | POA: Diagnosis not present

## 2022-12-10 ENCOUNTER — Encounter (HOSPITAL_COMMUNITY): Payer: Self-pay | Admitting: Nephrology

## 2022-12-13 ENCOUNTER — Other Ambulatory Visit (HOSPITAL_COMMUNITY)
Admission: RE | Admit: 2022-12-13 | Discharge: 2022-12-13 | Disposition: A | Discharge status: Home / Self Care | Payer: No Typology Code available for payment source | Source: Ambulatory Visit | Attending: Internal Medicine | Admitting: Internal Medicine

## 2022-12-13 DIAGNOSIS — R809 Proteinuria, unspecified: Secondary | ICD-10-CM | POA: Insufficient documentation

## 2022-12-14 LAB — MICROALBUMIN, URINE: Microalb, Ur: 21.1 ug/mL — ABNORMAL HIGH

## 2022-12-15 DIAGNOSIS — Z008 Encounter for other general examination: Secondary | ICD-10-CM | POA: Diagnosis not present

## 2022-12-15 DIAGNOSIS — E669 Obesity, unspecified: Secondary | ICD-10-CM | POA: Diagnosis not present

## 2022-12-15 DIAGNOSIS — F17211 Nicotine dependence, cigarettes, in remission: Secondary | ICD-10-CM | POA: Diagnosis not present

## 2022-12-15 DIAGNOSIS — E785 Hyperlipidemia, unspecified: Secondary | ICD-10-CM | POA: Diagnosis not present

## 2022-12-15 DIAGNOSIS — Z6833 Body mass index (BMI) 33.0-33.9, adult: Secondary | ICD-10-CM | POA: Diagnosis not present

## 2022-12-15 DIAGNOSIS — F0390 Unspecified dementia without behavioral disturbance: Secondary | ICD-10-CM | POA: Diagnosis not present

## 2022-12-16 ENCOUNTER — Ambulatory Visit (HOSPITAL_COMMUNITY)
Admission: RE | Admit: 2022-12-16 | Discharge: 2022-12-16 | Disposition: A | Discharge status: Home / Self Care | Payer: No Typology Code available for payment source | Source: Ambulatory Visit | Attending: Nephrology | Admitting: Nephrology

## 2022-12-16 DIAGNOSIS — N184 Chronic kidney disease, stage 4 (severe): Secondary | ICD-10-CM | POA: Insufficient documentation

## 2022-12-16 DIAGNOSIS — E1122 Type 2 diabetes mellitus with diabetic chronic kidney disease: Secondary | ICD-10-CM

## 2022-12-16 DIAGNOSIS — N281 Cyst of kidney, acquired: Secondary | ICD-10-CM | POA: Diagnosis not present

## 2023-01-21 DIAGNOSIS — H524 Presbyopia: Secondary | ICD-10-CM | POA: Diagnosis not present

## 2023-02-10 DIAGNOSIS — Z6834 Body mass index (BMI) 34.0-34.9, adult: Secondary | ICD-10-CM | POA: Diagnosis not present

## 2023-02-10 DIAGNOSIS — J40 Bronchitis, not specified as acute or chronic: Secondary | ICD-10-CM | POA: Diagnosis not present

## 2023-02-10 DIAGNOSIS — I10 Essential (primary) hypertension: Secondary | ICD-10-CM | POA: Diagnosis not present

## 2023-02-19 DIAGNOSIS — Z79899 Other long term (current) drug therapy: Secondary | ICD-10-CM | POA: Diagnosis not present

## 2023-02-19 DIAGNOSIS — K449 Diaphragmatic hernia without obstruction or gangrene: Secondary | ICD-10-CM | POA: Diagnosis not present

## 2023-02-19 DIAGNOSIS — N281 Cyst of kidney, acquired: Secondary | ICD-10-CM | POA: Diagnosis not present

## 2023-02-19 DIAGNOSIS — Z794 Long term (current) use of insulin: Secondary | ICD-10-CM | POA: Diagnosis not present

## 2023-02-19 DIAGNOSIS — I10 Essential (primary) hypertension: Secondary | ICD-10-CM | POA: Diagnosis not present

## 2023-02-19 DIAGNOSIS — R001 Bradycardia, unspecified: Secondary | ICD-10-CM | POA: Diagnosis not present

## 2023-02-19 DIAGNOSIS — R109 Unspecified abdominal pain: Secondary | ICD-10-CM | POA: Diagnosis not present

## 2023-02-19 DIAGNOSIS — K429 Umbilical hernia without obstruction or gangrene: Secondary | ICD-10-CM | POA: Diagnosis not present

## 2023-02-19 DIAGNOSIS — E119 Type 2 diabetes mellitus without complications: Secondary | ICD-10-CM | POA: Diagnosis not present

## 2023-02-19 DIAGNOSIS — Z5329 Procedure and treatment not carried out because of patient's decision for other reasons: Secondary | ICD-10-CM | POA: Diagnosis not present

## 2023-02-19 DIAGNOSIS — K922 Gastrointestinal hemorrhage, unspecified: Secondary | ICD-10-CM | POA: Diagnosis not present

## 2023-02-19 DIAGNOSIS — R1084 Generalized abdominal pain: Secondary | ICD-10-CM | POA: Diagnosis not present

## 2023-02-20 ENCOUNTER — Inpatient Hospital Stay (HOSPITAL_COMMUNITY): Payer: No Typology Code available for payment source

## 2023-02-20 ENCOUNTER — Encounter (HOSPITAL_COMMUNITY): Payer: Self-pay

## 2023-02-20 ENCOUNTER — Inpatient Hospital Stay (HOSPITAL_COMMUNITY)
Admission: EM | Admit: 2023-02-20 | Discharge: 2023-02-22 | DRG: 377 | Disposition: A | Discharge status: Home / Self Care | Payer: No Typology Code available for payment source | Attending: Internal Medicine | Admitting: Internal Medicine

## 2023-02-20 ENCOUNTER — Other Ambulatory Visit: Payer: Self-pay

## 2023-02-20 DIAGNOSIS — I129 Hypertensive chronic kidney disease with stage 1 through stage 4 chronic kidney disease, or unspecified chronic kidney disease: Secondary | ICD-10-CM | POA: Diagnosis present

## 2023-02-20 DIAGNOSIS — E1122 Type 2 diabetes mellitus with diabetic chronic kidney disease: Secondary | ICD-10-CM | POA: Diagnosis not present

## 2023-02-20 DIAGNOSIS — K449 Diaphragmatic hernia without obstruction or gangrene: Secondary | ICD-10-CM | POA: Diagnosis present

## 2023-02-20 DIAGNOSIS — Z6833 Body mass index (BMI) 33.0-33.9, adult: Secondary | ICD-10-CM | POA: Diagnosis not present

## 2023-02-20 DIAGNOSIS — Z23 Encounter for immunization: Secondary | ICD-10-CM

## 2023-02-20 DIAGNOSIS — Z87891 Personal history of nicotine dependence: Secondary | ICD-10-CM

## 2023-02-20 DIAGNOSIS — D696 Thrombocytopenia, unspecified: Secondary | ICD-10-CM | POA: Diagnosis present

## 2023-02-20 DIAGNOSIS — N184 Chronic kidney disease, stage 4 (severe): Secondary | ICD-10-CM | POA: Diagnosis present

## 2023-02-20 DIAGNOSIS — Z794 Long term (current) use of insulin: Secondary | ICD-10-CM | POA: Diagnosis not present

## 2023-02-20 DIAGNOSIS — K5730 Diverticulosis of large intestine without perforation or abscess without bleeding: Secondary | ICD-10-CM | POA: Diagnosis not present

## 2023-02-20 DIAGNOSIS — N281 Cyst of kidney, acquired: Secondary | ICD-10-CM | POA: Diagnosis not present

## 2023-02-20 DIAGNOSIS — R0902 Hypoxemia: Secondary | ICD-10-CM | POA: Diagnosis not present

## 2023-02-20 DIAGNOSIS — K920 Hematemesis: Secondary | ICD-10-CM | POA: Diagnosis not present

## 2023-02-20 DIAGNOSIS — Z96652 Presence of left artificial knee joint: Secondary | ICD-10-CM | POA: Diagnosis not present

## 2023-02-20 DIAGNOSIS — I10 Essential (primary) hypertension: Secondary | ICD-10-CM | POA: Diagnosis present

## 2023-02-20 DIAGNOSIS — K921 Melena: Secondary | ICD-10-CM | POA: Diagnosis present

## 2023-02-20 DIAGNOSIS — K922 Gastrointestinal hemorrhage, unspecified: Principal | ICD-10-CM

## 2023-02-20 DIAGNOSIS — R001 Bradycardia, unspecified: Secondary | ICD-10-CM | POA: Diagnosis present

## 2023-02-20 DIAGNOSIS — E538 Deficiency of other specified B group vitamins: Secondary | ICD-10-CM | POA: Diagnosis present

## 2023-02-20 DIAGNOSIS — K31811 Angiodysplasia of stomach and duodenum with bleeding: Secondary | ICD-10-CM | POA: Diagnosis not present

## 2023-02-20 DIAGNOSIS — K2100 Gastro-esophageal reflux disease with esophagitis, without bleeding: Secondary | ICD-10-CM | POA: Diagnosis present

## 2023-02-20 DIAGNOSIS — K219 Gastro-esophageal reflux disease without esophagitis: Secondary | ICD-10-CM | POA: Diagnosis present

## 2023-02-20 DIAGNOSIS — R109 Unspecified abdominal pain: Secondary | ICD-10-CM | POA: Diagnosis not present

## 2023-02-20 DIAGNOSIS — R1013 Epigastric pain: Secondary | ICD-10-CM | POA: Diagnosis not present

## 2023-02-20 DIAGNOSIS — K5900 Constipation, unspecified: Secondary | ICD-10-CM | POA: Diagnosis present

## 2023-02-20 DIAGNOSIS — J9601 Acute respiratory failure with hypoxia: Secondary | ICD-10-CM | POA: Diagnosis present

## 2023-02-20 DIAGNOSIS — G309 Alzheimer's disease, unspecified: Secondary | ICD-10-CM | POA: Diagnosis not present

## 2023-02-20 DIAGNOSIS — R0989 Other specified symptoms and signs involving the circulatory and respiratory systems: Secondary | ICD-10-CM | POA: Diagnosis not present

## 2023-02-20 DIAGNOSIS — F0280 Dementia in other diseases classified elsewhere without behavioral disturbance: Secondary | ICD-10-CM | POA: Diagnosis present

## 2023-02-20 DIAGNOSIS — Z7982 Long term (current) use of aspirin: Secondary | ICD-10-CM

## 2023-02-20 DIAGNOSIS — E66811 Obesity, class 1: Secondary | ICD-10-CM | POA: Diagnosis present

## 2023-02-20 DIAGNOSIS — K31819 Angiodysplasia of stomach and duodenum without bleeding: Secondary | ICD-10-CM | POA: Diagnosis not present

## 2023-02-20 DIAGNOSIS — Z79899 Other long term (current) drug therapy: Secondary | ICD-10-CM

## 2023-02-20 DIAGNOSIS — D62 Acute posthemorrhagic anemia: Secondary | ICD-10-CM | POA: Diagnosis present

## 2023-02-20 DIAGNOSIS — K429 Umbilical hernia without obstruction or gangrene: Secondary | ICD-10-CM | POA: Diagnosis not present

## 2023-02-20 DIAGNOSIS — E119 Type 2 diabetes mellitus without complications: Secondary | ICD-10-CM

## 2023-02-20 LAB — COMPREHENSIVE METABOLIC PANEL
ALT: 15 U/L (ref 0–44)
AST: 16 U/L (ref 15–41)
Albumin: 3 g/dL — ABNORMAL LOW (ref 3.5–5.0)
Alkaline Phosphatase: 106 U/L (ref 38–126)
Anion gap: 7 (ref 5–15)
BUN: 50 mg/dL — ABNORMAL HIGH (ref 8–23)
CO2: 24 mmol/L (ref 22–32)
Calcium: 8.9 mg/dL (ref 8.9–10.3)
Chloride: 106 mmol/L (ref 98–111)
Creatinine, Ser: 3.1 mg/dL — ABNORMAL HIGH (ref 0.61–1.24)
GFR, Estimated: 20 mL/min — ABNORMAL LOW (ref >60)
Glucose, Bld: 155 mg/dL — ABNORMAL HIGH (ref 70–99)
Potassium: 4.6 mmol/L (ref 3.5–5.1)
Sodium: 137 mmol/L (ref 135–145)
Total Bilirubin: 0.8 mg/dL (ref 0.0–1.2)
Total Protein: 7.2 g/dL (ref 6.5–8.1)

## 2023-02-20 LAB — CBC
HCT: 37 % — ABNORMAL LOW (ref 39.0–52.0)
Hemoglobin: 11.3 g/dL — ABNORMAL LOW (ref 13.0–17.0)
MCH: 28.2 pg (ref 26.0–34.0)
MCHC: 30.5 g/dL (ref 30.0–36.0)
MCV: 92.3 fL (ref 80.0–100.0)
Platelets: 147 10*3/uL — ABNORMAL LOW (ref 150–400)
RBC: 4.01 MIL/uL — ABNORMAL LOW (ref 4.22–5.81)
RDW: 15.1 % (ref 11.5–15.5)
WBC: 7.1 10*3/uL (ref 4.0–10.5)
nRBC: 0 % (ref 0.0–0.2)

## 2023-02-20 LAB — POC OCCULT BLOOD, ED: Fecal Occult Bld: NEGATIVE

## 2023-02-20 LAB — GLUCOSE, CAPILLARY: Glucose-Capillary: 78 mg/dL (ref 70–99)

## 2023-02-20 LAB — TYPE AND SCREEN
ABO/RH(D): A POS
Antibody Screen: NEGATIVE

## 2023-02-20 LAB — LIPASE, BLOOD: Lipase: 27 U/L (ref 11–51)

## 2023-02-20 LAB — HEMOGLOBIN AND HEMATOCRIT, BLOOD
HCT: 41.2 % (ref 39.0–52.0)
Hemoglobin: 12.1 g/dL — ABNORMAL LOW (ref 13.0–17.0)

## 2023-02-20 LAB — PROCALCITONIN: Procalcitonin: 4.14 ng/mL

## 2023-02-20 MED ORDER — ATORVASTATIN CALCIUM 20 MG PO TABS
20.0000 mg | ORAL_TABLET | Freq: Every day | ORAL | Status: DC
  Administered 2023-02-20 – 2023-02-21 (×2): 20 mg via ORAL
  Filled 2023-02-20 (×2): qty 1

## 2023-02-20 MED ORDER — HYDRALAZINE HCL 25 MG PO TABS: 25.0000 mg | ORAL_TABLET | Freq: Two times a day (BID) | ORAL | Status: DC

## 2023-02-20 MED ORDER — PNEUMOCOCCAL 20-VAL CONJ VACC 0.5 ML IM SUSY
0.5000 mL | PREFILLED_SYRINGE | INTRAMUSCULAR | Status: AC
  Administered 2023-02-21: 0.5 mL via INTRAMUSCULAR
  Filled 2023-02-20: qty 0.5

## 2023-02-20 MED ORDER — INSULIN ASPART 100 UNIT/ML IJ SOLN
0.0000 [IU] | INTRAMUSCULAR | Status: DC | PRN
  Administered 2023-02-22 (×2): 1 [IU] via SUBCUTANEOUS

## 2023-02-20 MED ORDER — ACETAMINOPHEN 650 MG RE SUPP: 650.0000 mg | Freq: Four times a day (QID) | RECTAL | Status: DC | PRN

## 2023-02-20 MED ORDER — SODIUM CHLORIDE 0.9 % IV BOLUS
500.0000 mL | Freq: Once | INTRAVENOUS | Status: AC
  Administered 2023-02-20: 500 mL via INTRAVENOUS

## 2023-02-20 MED ORDER — ONDANSETRON HCL 4 MG/2ML IJ SOLN
4.0000 mg | Freq: Once | INTRAMUSCULAR | Status: AC
  Administered 2023-02-20: 4 mg via INTRAVENOUS
  Filled 2023-02-20: qty 2

## 2023-02-20 MED ORDER — ALLOPURINOL 100 MG PO TABS
50.0000 mg | ORAL_TABLET | Freq: Two times a day (BID) | ORAL | Status: DC
  Administered 2023-02-21 – 2023-02-22 (×2): 50 mg via ORAL
  Filled 2023-02-20 (×2): qty 1

## 2023-02-20 MED ORDER — ACETAMINOPHEN 325 MG PO TABS: 650.0000 mg | ORAL_TABLET | Freq: Four times a day (QID) | ORAL | Status: DC | PRN

## 2023-02-20 MED ORDER — MORPHINE SULFATE (PF) 2 MG/ML IV SOLN: 2.0000 mg | INTRAVENOUS | Status: DC | PRN

## 2023-02-20 MED ORDER — DONEPEZIL HCL 5 MG PO TABS
10.0000 mg | ORAL_TABLET | Freq: Every evening | ORAL | Status: DC
  Administered 2023-02-20 – 2023-02-21 (×2): 10 mg via ORAL
  Filled 2023-02-20 (×2): qty 2

## 2023-02-20 MED ORDER — ONDANSETRON HCL 4 MG PO TABS: 4.0000 mg | ORAL_TABLET | Freq: Four times a day (QID) | ORAL | Status: DC | PRN

## 2023-02-20 MED ORDER — CHLORHEXIDINE GLUCONATE CLOTH 2 % EX PADS
6.0000 | MEDICATED_PAD | Freq: Every day | CUTANEOUS | Status: DC
  Administered 2023-02-21 – 2023-02-22 (×2): 6 via TOPICAL

## 2023-02-20 MED ORDER — ONDANSETRON HCL 4 MG/2ML IJ SOLN: 4.0000 mg | Freq: Four times a day (QID) | INTRAMUSCULAR | Status: DC | PRN

## 2023-02-20 MED ORDER — HYDRALAZINE HCL 20 MG/ML IJ SOLN: 5.0000 mg | INTRAMUSCULAR | Status: DC | PRN

## 2023-02-20 MED ORDER — PANTOPRAZOLE SODIUM 40 MG IV SOLR
40.0000 mg | INTRAVENOUS | Status: DC
  Administered 2023-02-20: 40 mg via INTRAVENOUS
  Filled 2023-02-20: qty 10

## 2023-02-20 MED ORDER — PANTOPRAZOLE SODIUM 40 MG IV SOLR
40.0000 mg | Freq: Once | INTRAVENOUS | Status: AC
  Administered 2023-02-20: 40 mg via INTRAVENOUS
  Filled 2023-02-20: qty 10

## 2023-02-20 MED ORDER — ASPIRIN 81 MG PO TABS: 81.0000 mg | ORAL_TABLET | Freq: Every day | ORAL | Status: DC

## 2023-02-20 MED ORDER — MEMANTINE HCL 10 MG PO TABS
10.0000 mg | ORAL_TABLET | Freq: Two times a day (BID) | ORAL | Status: DC
  Administered 2023-02-20 – 2023-02-22 (×3): 10 mg via ORAL
  Filled 2023-02-20 (×4): qty 1

## 2023-02-20 MED ORDER — INFLUENZA VAC A&B SURF ANT ADJ 0.5 ML IM SUSY
0.5000 mL | PREFILLED_SYRINGE | INTRAMUSCULAR | Status: AC
  Administered 2023-02-21: 0.5 mL via INTRAMUSCULAR
  Filled 2023-02-20: qty 0.5

## 2023-02-20 MED ORDER — TERAZOSIN HCL 2 MG PO CAPS: 2.0000 mg | ORAL_CAPSULE | Freq: Every day | ORAL | Status: DC

## 2023-02-20 NOTE — H&P (Addendum)
 History and Physical    Patient: Terry Stevens FMW:[id number] DOB: May 15, 1945 DOA: 02/20/2023 DOS: the patient was seen and examined on 02/20/2023 PCP: Orpha Yancey LABOR, MD  Patient coming from: Home Chief complaint: Chief Complaint  Patient presents with   Emesis   HPI:  Terry Stevens is a 78 y.o. male with past medical history  of osteoarthritis on naproxen, obesity with a BMI of 33.45, DM II with no recent A1c, HTN, CKD stage 4, vitamin B12 and folate deficiency, comes today for melena coffee ground emesis that occurred yesterday pt was taken to [facility] and  initially and they left and came here instead. Per wife at bedside total 3 days of melenotic vomitus and asymptomatic otherwise. Wife wants pt to be Full Code. No other symptoms / complaints or findings on exam except pt is hypoxic and has basilar rales CXR is ordered and pending. Pt denies any chest pain dizziness rectal bleeding stool changes abd pain or falls. No other complaints. In ed there pt had abd pain. None today on exam.   ED Course: In emergency room vitals trend shows: afebrile, stable vitals with bradycardia. O2 sats 93% at RA.  Vitals:   02/20/23 1606 02/20/23 1630 02/20/23 1751 02/20/23 1806  BP:  (!) 112/58  (!) 164/78  Pulse:  (!) 51  (!) 55  Temp: 98.1 F (36.7 C)  97.8 F (36.6 C)   Resp:    15  Height:   5' 9 (1.753 m)   Weight:   101.4 kg   SpO2:  91% 97% 95%  TempSrc: Oral  Oral   BMI (Calculated):   33   Evaluation so far shows: EKG shows sinus bradycardia at 53 PR 154 QTc 392 no ST-T wave changes. CMP shows glucose 155 BUN of 50 and a creatinine of 3.10 and a EGFR of 20 putting patient has CKD stage IV with last renal ultrasound in November 2024 showing medical renal disease and no obstruction stones or hydronephrosis.  Normal LFTs. CBC shows hemoglobin of 11.3 similar to his hemoglobin of 10/2022, normal white count and platelet count of 147.  MCV of 92.3 and RDW of 15.1. Occult stool negative  in the ED today.  In the ED pt received Protonix  and normal saline along with Zofran  as below by EDMD. Medications  sodium chloride  0.9 % bolus 500 mL ( Intravenous Restarted 02/20/23 1523)  pantoprazole  (PROTONIX ) injection 40 mg (40 mg Intravenous Given 02/20/23 1525)  ondansetron  (ZOFRAN ) injection 4 mg (4 mg Intravenous Given 02/20/23 1524)   Review of Systems  Gastrointestinal:  Positive for melena and vomiting.  All other systems reviewed and are negative.  Past Medical History:  Diagnosis Date   Arthritis    Constipation    Diabetes mellitus without complication (HCC)    GERD (gastroesophageal reflux disease)    tums prn   Gout    Hypertension    Osteoarthritis of left knee 02/27/2013   Past Surgical History:  Procedure Laterality Date   CATARACT EXTRACTION W/PHACO Right 06/13/2020   Procedure: CATARACT EXTRACTION PHACO AND INTRAOCULAR LENS PLACEMENT RIGHT EYE;  Surgeon: Harrie Agent, MD;  Location: [facility];  Service: Ophthalmology;  Laterality: Right;  CDE  8.92   CATARACT EXTRACTION W/PHACO Left 06/27/2020   Procedure: CATARACT EXTRACTION PHACO AND INTRAOCULAR LENS PLACEMENT LEFT EYE;  Surgeon: Harrie Agent, MD;  Location: [facility];  Service: Ophthalmology;  Laterality: Left;  left,  CDE=8.21   NO PAST SURGERIES     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 His smoking use included cigarettes. He has never used smokeless tobacco. He reports that he does not drink alcohol and does not use drugs.  No Known Allergies  History reviewed. No pertinent family history.  Prior to Admission medications   Medication Sig Start Date End Date Taking? Authorizing Provider  allopurinol  (ZYLOPRIM ) 300 MG tablet Take 300 mg by mouth daily.    [provider]  amLODipine  (NORVASC ) 5 MG tablet Take 5 mg by  mouth daily.    [provider]  aspirin  81 MG tablet Take 81 mg by mouth daily.    [provider]  atorvastatin  (LIPITOR) 20 MG tablet Take 20 mg by mouth daily at 6 PM.     [provider]  Cholecalciferol (VITAMIN D ) 50 MCG (2000 UT) tablet Take 2,000 Units by mouth every evening.    [provider]  donepezil  (ARICEPT ) 10 MG tablet Take 10 mg by mouth every evening.    [provider]  famotidine  (PEPCID ) 20 MG tablet Take 20 mg by mouth at bedtime.    [provider]  furosemide  (LASIX ) 40 MG tablet Take 40 mg by mouth daily.    [provider]  hydrALAZINE  (APRESOLINE ) 25 MG tablet Take 25 mg by mouth 2 (two) times daily.    [provider]  insulin  detemir (LEVEMIR) 100 UNIT/ML injection Inject 20 Units into the skin 2 (two) times daily.    [provider]  insulin  regular (NOVOLIN R) 100 units/mL injection Inject 8 Units into the skin 3 (three) times daily as needed (before meals when out of Levemir).    [provider]  memantine  (NAMENDA ) 10 MG tablet Take 10 mg by mouth 2 (two) times daily.    [provider]  naproxen sodium (ALEVE) 220 MG tablet Take 440 mg by mouth daily as needed (pain).    [provider]  Omega-3 Fatty Acids (FISH OIL ) 1000 MG CAPS Take 1,000 mg by mouth 2 (two) times daily.    [provider]  terazosin  (HYTRIN ) 2 MG capsule Take 2 mg by mouth at bedtime.    [provider]  zinc gluconate 50 MG tablet Take 50 mg by mouth daily.    [provider]     Vitals:   02/20/23 1606 02/20/23 1630 02/20/23 1751 02/20/23 1806  BP:  (!) 112/58  (!) 164/78  Pulse:  (!) 51  (!) 55  Resp:    15  Temp: 98.1 F (36.7 C)  97.8 F (36.6 C)   TempSrc: Oral  Oral   SpO2:  91% 97% 95%  Weight:   101.4 kg   Height:   5' 9 (1.753 m)    Physical Exam Vitals and nursing note reviewed.  Constitutional:      General: He is not in acute  distress. HENT:     Head: Normocephalic and atraumatic.     Right Ear: Hearing normal.     Left Ear: Hearing normal.     Nose: Nose normal. No nasal deformity.     Mouth/Throat:     Lips: Pink.     Tongue: No lesions.     Pharynx: Oropharynx is clear.  Eyes:     General: Lids are normal.     Extraocular Movements:  Extraocular movements intact.  Cardiovascular:     Rate and Rhythm: Normal rate and regular rhythm.     Heart sounds: Normal heart sounds.  Pulmonary:     Effort: Pulmonary effort is normal.     Breath sounds: Examination of the right-middle field reveals wheezing. Examination of the left-middle field reveals wheezing. Examination of the right-lower field reveals wheezing. Examination of the left-lower field reveals wheezing. Wheezing present.  Abdominal:     General: Bowel sounds are normal. There is no distension.     Palpations: Abdomen is soft. There is no mass.     Tenderness: There is no abdominal tenderness.  Musculoskeletal:     Right lower leg: No edema.     Left lower leg: No edema.  Skin:    General: Skin is warm.  Neurological:     General: No focal deficit present.     Mental Status: He is alert. He is disoriented.     Cranial Nerves: Cranial nerves 2-12 are intact. No cranial nerve deficit.     Motor: No weakness.  Psychiatric:        Attention and Perception: Attention normal.        Mood and Affect: Mood normal.        Speech: Speech normal.        Behavior: Behavior normal. Behavior is cooperative.      Labs on Admission: I have personally reviewed following labs and imaging studies Results for orders placed or performed during the hospital encounter of 02/20/23 (from the past 24 hours)  Lipase, blood     Status: None   Collection Time: 02/20/23  1:16 PM  Result Value Ref Range   Lipase 27 11 - 51 U/L  Comprehensive metabolic panel     Status: Abnormal   Collection Time: 02/20/23  1:16 PM  Result Value Ref Range   Sodium 137 135 - 145  mmol/L   Potassium 4.6 3.5 - 5.1 mmol/L   Chloride 106 98 - 111 mmol/L   CO2 24 22 - 32 mmol/L   Glucose, Bld 155 (H) 70 - 99 mg/dL   BUN 50 (H) 8 - 23 mg/dL   Creatinine, Ser 6.89 (H) 0.61 - 1.24 mg/dL   Calcium  8.9 8.9 - 10.3 mg/dL   Total Protein 7.2 6.5 - 8.1 g/dL   Albumin 3.0 (L) 3.5 - 5.0 g/dL   AST 16 15 - 41 U/L   ALT 15 0 - 44 U/L   Alkaline Phosphatase 106 38 - 126 U/L   Total Bilirubin 0.8 0.0 - 1.2 mg/dL   GFR, Estimated 20 (L) >60 mL/min   Anion gap 7 5 - 15  CBC     Status: Abnormal   Collection Time: 02/20/23  1:16 PM  Result Value Ref Range   WBC 7.1 4.0 - 10.5 K/uL   RBC 4.01 (L) 4.22 - 5.81 MIL/uL   Hemoglobin 11.3 (L) 13.0 - 17.0 g/dL   HCT 62.9 (L) 60.9 - 47.9 %   MCV 92.3 80.0 - 100.0 fL   MCH 28.2 26.0 - 34.0 pg   MCHC 30.5 30.0 - 36.0 g/dL   RDW 84.8 88.4 - 84.4 %   Platelets 147 (L) 150 - 400 K/uL   nRBC 0.0 0.0 - 0.2 %  Type and screen     Status: None   Collection Time: 02/20/23  1:16 PM  Result Value Ref Range   ABO/RH(D) A POS    Antibody Screen NEG  Sample Expiration      02/23/2023,2359 Performed at [facility], [address], [place], [place] [address]   POC occult blood, ED     Status: None   Collection Time: 02/20/23  3:31 PM  Result Value Ref Range   Fecal Occult Bld NEGATIVE NEGATIVE   No results found for this or any previous visit (from the past 720 hours). CBC:    Latest Ref Rng & Units 02/20/2023    1:16 PM 10/14/2022    2:42 PM 09/28/2015    4:34 AM  CBC  WBC 4.0 - 10.5 K/uL 7.1  6.0  7.6   Hemoglobin 13.0 - 17.0 g/dL 88.6  88.9  89.5   Hematocrit 39.0 - 52.0 % 37.0  35.2  34.9   Platelets 150 - 400 K/uL 147  155  152    Basic Metabolic Panel: Recent Labs  Lab 02/20/23 1316  NA 137  K 4.6  CL 106  CO2 24  GLUCOSE 155*  BUN 50*  CREATININE 3.10*  CALCIUM  8.9   Creatinine: Lab Results  Component Value Date   CREATININE 3.10 (H) 02/20/2023   CREATININE 3.08 (H) 10/14/2022   CREATININE 1.27 (H) 09/28/2015    Liver Function Tests:    Latest Ref Rng & Units 02/20/2023    1:16 PM 10/14/2022    2:05 PM 09/28/2015    4:34 AM  Hepatic Function  Total Protein 6.5 - 8.1 g/dL 7.2   6.1   Albumin 3.5 - 5.0 g/dL 3.0  3.3  2.9   AST 15 - 41 U/L 16   24   ALT 0 - 44 U/L 15   15   Alk Phosphatase 38 - 126 U/L 106   73   Total Bilirubin 0.0 - 1.2 mg/dL 0.8   0.7     Unresulted Labs (From admission, onward)     Start     Ordered   02/21/23 0500  CBC  Tomorrow morning,   R        02/20/23 1632   02/21/23 0500  Comprehensive metabolic panel  Tomorrow morning,   R        02/20/23 1632   02/20/23 1751  MRSA Next Gen by PCR, Nasal  Once,   R        02/20/23 1750   02/20/23 1735  Procalcitonin  Add-on,   AD       References:    Procalcitonin Lower Respiratory Tract Infection AND Sepsis Procalcitonin Algorithm   02/20/23 1734   02/20/23 1720  Hemoglobin and hematocrit, blood  Once,   R        02/20/23 1720   02/20/23 1720  Hemoglobin A1c  Once,   R        02/20/23 1720   02/20/23 1546  Urinalysis, Routine w reflex microscopic -Urine, Random  Add-on,   AD       Question:  Specimen Source  Answer:  Urine, Random   02/20/23 1632   02/20/23 1247  Urinalysis, Routine w reflex microscopic -Urine, Clean Catch  Once,   URGENT       Question:  Specimen Source  Answer:  Urine, Clean Catch   02/20/23 1247           Radiological Exams on Admission: DG Chest 1 View Result Date: 02/20/2023 CLINICAL DATA:  Hypoxia. EXAM: CHEST  1 VIEW COMPARISON:  Two-view chest x-ray 08/04/2022 FINDINGS: Heart size is exaggerated by low lung volumes and lordotic projection. No edema  or effusion is present. No focal airspace disease is present. The visualized soft tissues and bony thorax are unremarkable. IMPRESSION: 1. Low lung volumes. 2. No acute cardiopulmonary disease. Electronically Signed   By: Lonni Necessary M.D.   On: 02/20/2023 17:08    Data Reviewed: Relevant notes from primary care and specialist visits, past  discharge summaries as available in EHR, including Care Everywhere. Prior diagnostic testing as pertinent to current admission diagnoses, Updated medications and problem lists for reconciliation ED course, including vitals, labs, imaging, treatment and response to treatment,Triage notes, nursing and pharmacy notes and ED provider's notes Notable results as noted in HPI.Discussed case with EDMD/ ED APP/ or Specialty MD on call and as needed.  Assessment and Plan: * Melena Patient presenting with coffee-ground emesis and melena consistent with GI bleed.  Will get GI consult keep patient n.p.o. IV PPIs and repeat H&H transfuse as deemed appropriate.  Acute blood loss anemia    Latest Ref Rng & Units 02/20/2023    1:16 PM 10/14/2022    2:42 PM 09/28/2015    4:34 AM  CBC  WBC 4.0 - 10.5 K/uL 7.1  6.0  7.6   Hemoglobin 13.0 - 17.0 g/dL 88.6  88.9  89.5   Hematocrit 39.0 - 52.0 % 37.0  35.2  34.9   Platelets 150 - 400 K/uL 147  155  152    Suspect ABLA from GI loss.  We will monitor with a repeat H&H type and screen, continue with IV PPI therapy.  GERD (gastroesophageal reflux disease) IV PPI therapy.   Thrombocytopenia (HCC) Mild thrombocytopenia.  No history of alcohol abuse in chart.  Will monitor.  Antiplatelet therapy for DVT prophylaxis currently held and will place SCDs.  CKD (chronic kidney disease) stage 4, GFR 15-29 ml/min (HCC) Patient has CKD and creatinine has remained more or less the same there is however elevation in BUN could consider consistent with his GI bleed history.  Diabetes mellitus without complication (HCC) Currently patient will be n.p.o. and will manage with sliding scale regimen with Accu-Cheks every 4 hour.  Will obtain A1c.  Hypertension Vitals:   02/20/23 1245 02/20/23 1605  BP: (!) 140/98 114/61  Blood pressure is low.  We will currently hold hydralazine  and hytrin  as well.      Sinus bradycardia Sinus bradycardia noted on vitals as well as EKG  otherwise EKG is normal with no ischemic changes.  Will monitor.  No beta-blockers or AV nodal blocking agents in chart on med list.  Hypoxia Today's Vitals   02/20/23 1245 02/20/23 1605 02/20/23 1606 02/20/23 1630  BP: (!) 140/98 114/61  (!) 112/58  Pulse: (!) 52 (!) 53  (!) 51  Resp: 17     Temp: 98.1 F (36.7 C)  98.1 F (36.7 C)   TempSrc: Oral  Oral   SpO2: 93% (!) 88%  91%  Weight:      Height:      PainSc:       Body mass index is 33.45 kg/m. Chest xray pending.  Start pt on 2 L [place].   DVT prophylaxis:  SCDs Consults:  Gastroenterology consult; Dr. Eartha.  Advance Care Planning:    Code Status: Full Code  Family Communication:  Wife.  Disposition Plan:  Home  Severity of Illness: The appropriate patient status for this patient is INPATIENT. Inpatient status is judged to be reasonable and necessary in order to provide the required intensity of service to ensure the patient's safety. The patient's  presenting symptoms, physical exam findings, and initial radiographic and laboratory data in the context of their chronic comorbidities is felt to place them at high risk for further clinical deterioration. Furthermore, it is not anticipated that the patient will be medically stable for discharge from the hospital within 2 midnights of admission.   * I certify that at the point of admission it is my clinical judgment that the patient will require inpatient hospital care spanning beyond 2 midnights from the point of admission due to high intensity of service, high risk for further deterioration and high frequency of surveillance required.*  Author: Mario LULLA Blanch, MD 02/20/2023 6:16 PM  For on call review [email or web address].   Orders Placed This Encounter  Procedures   MRSA Next Gen by PCR, Nasal   DG Chest 1 View   CT ABDOMEN PELVIS WO CONTRAST   Lipase, blood   Comprehensive metabolic panel   CBC   Urinalysis, Routine w reflex microscopic -Urine, Clean Catch    Urinalysis, Routine w reflex microscopic -Urine, Random   CBC   Comprehensive metabolic panel   Hemoglobin and hematocrit, blood   Hemoglobin A1c   Procalcitonin   Diet NPO time specified   Apply Diabetes Mellitus Care Plan   STAT CBG when hypoglycemia is suspected. If treated, recheck every 15 minutes after each treatment until CBG >/= 70 mg/dl   Refer to Hypoglycemia Protocol Sidebar Report for treatment of CBG < 70 mg/dl   No HS correction Insulin    SCDs   Cardiac Monitoring Continuous x 24 hours Indications for use: Other; other indications for use: Monitor for ischemia   Vital signs   Notify physician (specify)   Mobility Protocol: No Restrictions   Refer to Sidebar Report Refer to ICU, Med-Surg, Progressive, and Step-Down Mobility Protocol Sidebars   Initiate Adult Central Line Maintenance and Catheter Protocol for patients with central line (CVC, PICC, Port, Hemodialysis, Trialysis)   If patient diabetic or glucose greater than 140 notify physician for Sliding Scale Insulin  Orders   Intake and Output   Do not place and if present remove PureWick   Initiate Oral Care Protocol   Initiate Carrier Fluid Protocol   RN may order General Admission PRN Orders utilizing General Admission PRN medications (through manage orders) for the following patient needs: allergy symptoms (Claritin), cold sores (Carmex), cough (Robitussin DM), eye irritation (Liquifilm Tears), hemorrhoids (Tucks), indigestion (Maalox), minor skin irritation (Hydrocortisone Cream), muscle pain (Ben Gay), nose irritation (saline nasal spray) and sore throat (Chloraseptic spray).   Strict intake and output   Daily weights   Full code   Consult to hospitalist   Consult to gastroenterology Consult Timeframe: ROUTINE - requires response within 24 hours; Reason for Consult? Melena   Pulse oximetry check with vital signs   Oxygen  therapy Mode or (Route): Nasal cannula; Liters Per Minute: 2; Keep O2 saturation between:  greater than 92 %   POC occult blood, ED   ED EKG   ECHOCARDIOGRAM COMPLETE   Type and screen   Admit to Inpatient (patient's expected length of stay will be greater than 2 midnights or inpatient only procedure)

## 2023-02-20 NOTE — ED Triage Notes (Signed)
 Pt comes in for a second opinion. Pt wife states they went to [facility] yesterday for vomiting for the past 3 days and pt had blood in his vomit per [facility] and wanted him to go to [place] but she was not pleased with their services there and brought him in today. Per spouse pt has not vomited today. Pt has not complaints today.

## 2023-02-20 NOTE — Assessment & Plan Note (Signed)
IV PPI therapy.  

## 2023-02-20 NOTE — Assessment & Plan Note (Signed)
 Patient has CKD and creatinine has remained more or less the same there is however elevation in BUN could consider consistent with his GI bleed history.

## 2023-02-20 NOTE — Assessment & Plan Note (Signed)
 Patient presenting with coffee-ground emesis and melena consistent with GI bleed.  Will get GI consult keep patient n.p.o. IV PPIs and repeat H&H transfuse as deemed appropriate.

## 2023-02-20 NOTE — Hospital Course (Addendum)
 Per chart review pt coming for melena he has initially gone to  [facility] [place] see note below: Patient and patient's family have decided that they want to leave AGAINST MEDICAL ADVICE they want to drive over to [place]. The patient's daughter is driving him along with the wife and the patient to [place] because they do not want to wait for the transfer process. I explained to them that we do not have any bed assignment from [place] to be able to transfer them yet and are waiting on [place] for that. They do not want to wait and they would like to leave. They do understand the risks of leaving including low blood pressure loss of consciousness of bleeding which can cause death. They understand this and they were explained all this by myself and the patient's nurse. However they are still refusing to wait and be cared for in the emergency room until we get best bed assignment from [place] to transfer them by ambulance. Family members have been involved in this however they are still refusing to wait.

## 2023-02-20 NOTE — ED Provider Notes (Signed)
 [place] EMERGENCY DEPARTMENT AT [facility] Provider Note   CSN: [id number] Arrival date & time: 02/20/23  1237     History  Chief Complaint  Patient presents with   Emesis    Terry Stevens is a 78 y.o. male.  Patient has been vomiting black stuff for 3 days.  Patient is on aspirin  and has a history of hypertension and diabetes.  He was seen at a hospital today and they wanted to send him someplace else but they came here instead.  The history is provided by the patient and the spouse. No language interpreter was used.  Emesis Severity:  Moderate Timing:  Intermittent Emesis appearance: black. Able to tolerate:  Liquids Progression:  Unchanged Chronicity:  New Recent urination:  Normal Relieved by:  Nothing Worsened by:  Nothing Ineffective treatments:  None tried Associated symptoms: no abdominal pain, no cough, no diarrhea and no headaches        Home Medications Prior to Admission medications   Medication Sig Start Date End Date Taking? Authorizing Provider  allopurinol  (ZYLOPRIM ) 300 MG tablet Take 300 mg by mouth daily.    [provider]  amLODipine  (NORVASC ) 5 MG tablet Take 5 mg by mouth daily.    [provider]  aspirin  81 MG tablet Take 81 mg by mouth daily.    [provider]  atorvastatin  (LIPITOR) 20 MG tablet Take 20 mg by mouth daily at 6 PM.     [provider]  Cholecalciferol (VITAMIN D ) 50 MCG (2000 UT) tablet Take 2,000 Units by mouth every evening.    [provider]  donepezil  (ARICEPT ) 10 MG tablet Take 10 mg by mouth every evening.    [provider]  famotidine  (PEPCID ) 20 MG tablet Take 20 mg by mouth at bedtime.    [provider]  furosemide  (LASIX ) 40 MG tablet Take 40 mg by mouth daily.    [provider]  hydrALAZINE  (APRESOLINE ) 25 MG tablet Take 25 mg by mouth 2 (two) times daily.    [provider]  insulin  detemir (LEVEMIR) 100 UNIT/ML  injection Inject 20 Units into the skin 2 (two) times daily.    [provider]  insulin  regular (NOVOLIN R) 100 units/mL injection Inject 8 Units into the skin 3 (three) times daily as needed (before meals when out of Levemir).    [provider]  memantine  (NAMENDA ) 10 MG tablet Take 10 mg by mouth 2 (two) times daily.    [provider]  naproxen sodium (ALEVE) 220 MG tablet Take 440 mg by mouth daily as needed (pain).    [provider]  Omega-3 Fatty Acids (FISH OIL ) 1000 MG CAPS Take 1,000 mg by mouth 2 (two) times daily.    [provider]  terazosin  (HYTRIN ) 2 MG capsule Take 2 mg by mouth at bedtime.    [provider]  zinc gluconate 50 MG tablet Take 50 mg by mouth daily.    [provider]      Allergies    Patient has no known allergies.    Review of Systems   Review of Systems  Constitutional:  Negative for appetite change and fatigue.  HENT:  Negative for congestion, ear discharge and sinus pressure.   Eyes:  Negative for discharge.  Respiratory:  Negative for cough.   Cardiovascular:  Negative for chest pain.  Gastrointestinal:  Positive for vomiting. Negative for abdominal pain and diarrhea.  Genitourinary:  Negative for frequency and  hematuria.  Musculoskeletal:  Negative for back pain.  Skin:  Negative for rash.  Neurological:  Negative for seizures and headaches.  Psychiatric/Behavioral:  Negative for hallucinations.     Physical Exam Updated Vital Signs BP (!) 140/98   Pulse (!) 52   Temp 98.1 F (36.7 C) (Oral)   Resp 17   Ht 5' 8 (1.727 m)   Wt 99.8 kg   SpO2 93%   BMI 33.45 kg/m  Physical Exam Vitals and nursing note reviewed.  Constitutional:      Appearance: He is well-developed.  HENT:     Head: Normocephalic.     Nose: Nose normal.  Eyes:     General: No scleral icterus.    Conjunctiva/sclera: Conjunctivae normal.  Neck:     Thyroid: No thyromegaly.  Cardiovascular:     Rate  and Rhythm: Normal rate and regular rhythm.     Heart sounds: No murmur heard.    No friction rub. No gallop.  Pulmonary:     Breath sounds: No stridor. No wheezing or rales.  Chest:     Chest wall: No tenderness.  Abdominal:     General: There is no distension.     Tenderness: There is no abdominal tenderness. There is no rebound.     Comments: Rectal exam heme negative  Genitourinary:    Comments: Rectal hem neg Musculoskeletal:        General: Normal range of motion.     Cervical back: Neck supple.  Lymphadenopathy:     Cervical: No cervical adenopathy.  Skin:    Findings: No erythema or rash (.mil).  Neurological:     Mental Status: He is alert and oriented to person, place, and time.     Motor: No abnormal muscle tone.     Coordination: Coordination normal.  Psychiatric:        Behavior: Behavior normal.     ED Results / Procedures / Treatments   Labs (all labs ordered are listed, but only abnormal results are displayed) Labs Reviewed  COMPREHENSIVE METABOLIC PANEL - Abnormal; Notable for the following components:      Result Value   Glucose, Bld 155 (*)    BUN 50 (*)    Creatinine, Ser 3.10 (*)    Albumin 3.0 (*)    GFR, Estimated 20 (*)    All other components within normal limits  CBC - Abnormal; Notable for the following components:   RBC 4.01 (*)    Hemoglobin 11.3 (*)    HCT 37.0 (*)    Platelets 147 (*)    All other components within normal limits  LIPASE, BLOOD  URINALYSIS, ROUTINE W REFLEX MICROSCOPIC  POC OCCULT BLOOD, ED  TYPE AND SCREEN    EKG EKG Interpretation Date/Time:  Sunday February 20 2023 12:50:17 EST Ventricular Rate:  53 PR Interval:  154 QRS Duration:  82 QT Interval:  418 QTC Calculation: 392 R Axis:   4  Text Interpretation: Sinus bradycardia Otherwise normal ECG When compared with ECG of 27-Sep-2015 17:44, PREVIOUS ECG IS PRESENT Confirmed by Suzette Pac [phone]) on 02/20/2023 2:04:35 PM  Radiology No results  found.  Procedures Procedures    Medications Ordered in ED Medications  sodium chloride  0.9 % bolus 500 mL ( Intravenous Restarted 02/20/23 1523)  pantoprazole  (PROTONIX ) injection 40 mg (40 mg Intravenous Given 02/20/23 1525)  ondansetron  (ZOFRAN ) injection 4 mg (4 mg Intravenous Given 02/20/23 1524)    ED Course/ Medical Decision Making/ A&P  I spoke  with Dr. Eartha gastroenterology and he agrees with hospitalist admission.  Patient also needs PPI twice a day.  Patient can have clear liquids now and n.p.o. after midnight                               Medical Decision Making Amount and/or Complexity of Data Reviewed Labs: ordered.  Risk Prescription drug management. Decision regarding hospitalization.  This patient presents to the ED for concern of vomiting dark stuff, this involves an extensive number of treatment options, and is a complaint that carries with it a high risk of complications and morbidity.  The differential diagnosis includes upper GI bleed   Co morbidities that complicate the patient evaluation  Hyperlipidemia and hypertension   Additional history obtained:  Additional history obtained from patient and spouse External records from outside source obtained and reviewed including hospital records   Lab Tests:  I Ordered, and personally interpreted labs.  The pertinent results include: BUN 50   Imaging Studies ordered:  I ordered imaging studies including chest x-ray I independently visualized and interpreted imaging which showed negative I agree with the radiologist interpretation   Cardiac Monitoring: / EKG:  The patient was maintained on a cardiac monitor.  I personally viewed and interpreted the cardiac monitored which showed an underlying rhythm of: Normal sinus rhythm   Consultations Obtained:  I requested consultation with the hospitalist and GI,  and discussed lab and imaging findings as well as pertinent plan - they recommend: Admit to  hospitalist with GI consult   Problem List / ED Course / Critical interventions / Medication management  Hypertension upper GI bleed I ordered medication including Protonix  Reevaluation of the patient after these medicines showed that the patient improved I have reviewed the patients home medicines and have made adjustments as needed   Social Determinants of Health:  None   Test / Admission - Considered:  None  Upper GI bleed.  Patient will be admitted to medicine with GI consult        Final Clinical Impression(s) / ED Diagnoses Final diagnoses:  None    Rx / DC Orders ED Discharge Orders     None         Suzette Pac, MD 02/21/23 1136

## 2023-02-20 NOTE — Assessment & Plan Note (Signed)
 Today's Vitals   02/20/23 1245 02/20/23 1605 02/20/23 1606 02/20/23 1630  BP: (!) 140/98 114/61  (!) 112/58  Pulse: (!) 52 (!) 53  (!) 51  Resp: 17     Temp: 98.1 F (36.7 C)  98.1 F (36.7 C)   TempSrc: Oral  Oral   SpO2: 93% (!) 88%  91%  Weight:      Height:      PainSc:       Body mass index is 33.45 kg/m. Chest xray pending.  Start pt on 2 L [place].

## 2023-02-20 NOTE — Assessment & Plan Note (Addendum)
 Mild thrombocytopenia.  No history of alcohol abuse in chart.  Will monitor.  Antiplatelet therapy for DVT prophylaxis currently held and will place SCDs.

## 2023-02-20 NOTE — ED Notes (Signed)
 CBG 186 on personal librae machine.

## 2023-02-20 NOTE — Assessment & Plan Note (Addendum)
    Latest Ref Rng & Units 02/20/2023    1:16 PM 10/14/2022    2:42 PM 09/28/2015    4:34 AM  CBC  WBC 4.0 - 10.5 K/uL 7.1  6.0  7.6   Hemoglobin 13.0 - 17.0 g/dL 88.6  88.9  89.5   Hematocrit 39.0 - 52.0 % 37.0  35.2  34.9   Platelets 150 - 400 K/uL 147  155  152    Suspect ABLA from GI loss.  We will monitor with a repeat H&H type and screen, continue with IV PPI therapy.

## 2023-02-20 NOTE — Assessment & Plan Note (Signed)
 Currently patient will be n.p.o. and will manage with sliding scale regimen with Accu-Cheks every 4 hour.  Will obtain A1c.

## 2023-02-20 NOTE — Assessment & Plan Note (Addendum)
 Vitals:   02/20/23 1245 02/20/23 1605  BP: (!) 140/98 114/61  Blood pressure is low.  We will currently hold hydralazine and hytrin as well.

## 2023-02-20 NOTE — Assessment & Plan Note (Signed)
 Sinus bradycardia noted on vitals as well as EKG otherwise EKG is normal with no ischemic changes.  Will monitor.  No beta-blockers or AV nodal blocking agents in chart on med list.

## 2023-02-21 ENCOUNTER — Inpatient Hospital Stay (HOSPITAL_COMMUNITY): Payer: No Typology Code available for payment source | Admitting: Anesthesiology

## 2023-02-21 ENCOUNTER — Other Ambulatory Visit (HOSPITAL_COMMUNITY): Payer: Self-pay | Admitting: *Deleted

## 2023-02-21 ENCOUNTER — Encounter (HOSPITAL_COMMUNITY)
Admission: EM | Disposition: A | Discharge status: Home / Self Care | Payer: Self-pay | Source: Home / Self Care | Attending: Internal Medicine

## 2023-02-21 ENCOUNTER — Inpatient Hospital Stay (HOSPITAL_COMMUNITY): Payer: No Typology Code available for payment source

## 2023-02-21 DIAGNOSIS — K2100 Gastro-esophageal reflux disease with esophagitis, without bleeding: Secondary | ICD-10-CM

## 2023-02-21 DIAGNOSIS — K449 Diaphragmatic hernia without obstruction or gangrene: Secondary | ICD-10-CM

## 2023-02-21 DIAGNOSIS — D62 Acute posthemorrhagic anemia: Secondary | ICD-10-CM

## 2023-02-21 DIAGNOSIS — R1013 Epigastric pain: Secondary | ICD-10-CM | POA: Diagnosis not present

## 2023-02-21 DIAGNOSIS — K31819 Angiodysplasia of stomach and duodenum without bleeding: Secondary | ICD-10-CM

## 2023-02-21 DIAGNOSIS — K922 Gastrointestinal hemorrhage, unspecified: Principal | ICD-10-CM

## 2023-02-21 DIAGNOSIS — N184 Chronic kidney disease, stage 4 (severe): Secondary | ICD-10-CM | POA: Diagnosis not present

## 2023-02-21 DIAGNOSIS — K920 Hematemesis: Secondary | ICD-10-CM | POA: Diagnosis not present

## 2023-02-21 DIAGNOSIS — K921 Melena: Secondary | ICD-10-CM | POA: Diagnosis not present

## 2023-02-21 HISTORY — PX: HOT HEMOSTASIS: SHX5433

## 2023-02-21 HISTORY — PX: ESOPHAGOGASTRODUODENOSCOPY (EGD) WITH PROPOFOL: SHX5813

## 2023-02-21 LAB — URINALYSIS, ROUTINE W REFLEX MICROSCOPIC
Bilirubin Urine: NEGATIVE
Glucose, UA: NEGATIVE mg/dL
Hgb urine dipstick: NEGATIVE
Ketones, ur: NEGATIVE mg/dL
Leukocytes,Ua: NEGATIVE
Nitrite: NEGATIVE
Protein, ur: NEGATIVE mg/dL
Specific Gravity, Urine: 1.016 (ref 1.005–1.030)
pH: 5 (ref 5.0–8.0)

## 2023-02-21 LAB — COMPREHENSIVE METABOLIC PANEL
ALT: 15 U/L (ref 0–44)
AST: 16 U/L (ref 15–41)
Albumin: 2.7 g/dL — ABNORMAL LOW (ref 3.5–5.0)
Alkaline Phosphatase: 98 U/L (ref 38–126)
Anion gap: 9 (ref 5–15)
BUN: 44 mg/dL — ABNORMAL HIGH (ref 8–23)
CO2: 23 mmol/L (ref 22–32)
Calcium: 8.7 mg/dL — ABNORMAL LOW (ref 8.9–10.3)
Chloride: 105 mmol/L (ref 98–111)
Creatinine, Ser: 2.94 mg/dL — ABNORMAL HIGH (ref 0.61–1.24)
GFR, Estimated: 22 mL/min — ABNORMAL LOW (ref >60)
Glucose, Bld: 76 mg/dL (ref 70–99)
Potassium: 4.3 mmol/L (ref 3.5–5.1)
Sodium: 137 mmol/L (ref 135–145)
Total Bilirubin: 0.8 mg/dL (ref 0.0–1.2)
Total Protein: 6.6 g/dL (ref 6.5–8.1)

## 2023-02-21 LAB — CBC
HCT: 34.4 % — ABNORMAL LOW (ref 39.0–52.0)
Hemoglobin: 10.6 g/dL — ABNORMAL LOW (ref 13.0–17.0)
MCH: 28.5 pg (ref 26.0–34.0)
MCHC: 30.8 g/dL (ref 30.0–36.0)
MCV: 92.5 fL (ref 80.0–100.0)
Platelets: 142 10*3/uL — ABNORMAL LOW (ref 150–400)
RBC: 3.72 MIL/uL — ABNORMAL LOW (ref 4.22–5.81)
RDW: 15.2 % (ref 11.5–15.5)
WBC: 6.7 10*3/uL (ref 4.0–10.5)
nRBC: 0 % (ref 0.0–0.2)

## 2023-02-21 LAB — GLUCOSE, CAPILLARY
Glucose-Capillary: 125 mg/dL — ABNORMAL HIGH (ref 70–99)
Glucose-Capillary: 144 mg/dL — ABNORMAL HIGH (ref 70–99)
Glucose-Capillary: 65 mg/dL — ABNORMAL LOW (ref 70–99)
Glucose-Capillary: 72 mg/dL (ref 70–99)
Glucose-Capillary: 73 mg/dL (ref 70–99)
Glucose-Capillary: 74 mg/dL (ref 70–99)
Glucose-Capillary: 85 mg/dL (ref 70–99)
Glucose-Capillary: 94 mg/dL (ref 70–99)

## 2023-02-21 LAB — HEMOGLOBIN A1C
Hgb A1c MFr Bld: 7.1 % — ABNORMAL HIGH (ref 4.8–5.6)
Mean Plasma Glucose: 157.07 mg/dL

## 2023-02-21 LAB — MRSA NEXT GEN BY PCR, NASAL: MRSA by PCR Next Gen: NOT DETECTED

## 2023-02-21 SURGERY — ESOPHAGOGASTRODUODENOSCOPY (EGD) WITH PROPOFOL
Anesthesia: Monitor Anesthesia Care

## 2023-02-21 MED ORDER — DEXTROSE 5 % IV SOLN: INTRAVENOUS | Status: AC

## 2023-02-21 MED ORDER — EPHEDRINE SULFATE-NACL 50-0.9 MG/10ML-% IV SOSY
PREFILLED_SYRINGE | INTRAVENOUS | Status: DC | PRN
  Administered 2023-02-21: 10 mg via INTRAVENOUS

## 2023-02-21 MED ORDER — LACTATED RINGERS IV SOLN: INTRAVENOUS | Status: DC | PRN

## 2023-02-21 MED ORDER — EPHEDRINE SULFATE (PRESSORS) 50 MG/ML IJ SOLN: INTRAMUSCULAR | Status: DC | PRN

## 2023-02-21 MED ORDER — PROPOFOL 10 MG/ML IV BOLUS
INTRAVENOUS | Status: DC | PRN
  Administered 2023-02-21 (×2): 50 mg via INTRAVENOUS
  Administered 2023-02-21: 20 mg via INTRAVENOUS

## 2023-02-21 MED ORDER — DEXTROSE 50 % IV SOLN
25.0000 mL | Freq: Once | INTRAVENOUS | Status: AC
  Administered 2023-02-21: 25 mL via INTRAVENOUS

## 2023-02-21 MED ORDER — SODIUM CHLORIDE 0.9 % IV SOLN: INTRAVENOUS | Status: DC

## 2023-02-21 MED ORDER — DEXTROSE 50 % IV SOLN
INTRAVENOUS | Status: AC
  Filled 2023-02-21: qty 50

## 2023-02-21 MED ORDER — PANTOPRAZOLE SODIUM 40 MG IV SOLR
40.0000 mg | Freq: Two times a day (BID) | INTRAVENOUS | Status: DC
  Administered 2023-02-21 – 2023-02-22 (×3): 40 mg via INTRAVENOUS
  Filled 2023-02-21 (×3): qty 10

## 2023-02-21 NOTE — TOC CM/SW Note (Signed)
 Transition of Care [facility]) - Inpatient Brief Assessment   Patient Details  Name: Terry Stevens MRN: [id number] Date of Birth: Dec 02, 1945  Transition of Care [facility]) CM/SW Contact:    Lucie Lunger, LCSWA Phone Number: 02/21/2023, 11:21 AM   Clinical Narrative: Transition of Care Department [facility]) has reviewed patient and no TOC needs have been identified at this time. We will continue to monitor patient advancement through interdisciplinary progression rounds. If new patient transition needs arise, please place a TOC consult.  Transition of Care Asessment: Insurance and Status: Insurance coverage has been reviewed Patient has primary care physician: Yes Home environment has been reviewed: From home Prior level of function:: Indpendent Prior/Current Home Services: No current home services Social Drivers of Health Review: SDOH reviewed no interventions necessary Readmission risk has been reviewed: Yes Transition of care needs: no transition of care needs at this time

## 2023-02-21 NOTE — Plan of Care (Signed)
   Problem: Skin Integrity: Goal: Risk for impaired skin integrity will decrease Outcome: Progressing   Problem: Activity: Goal: Risk for activity intolerance will decrease Outcome: Progressing   Problem: Coping: Goal: Level of anxiety will decrease Outcome: Progressing

## 2023-02-21 NOTE — Op Note (Signed)
 [facility] Patient Name: Terry Stevens Procedure Date: 02/21/2023 2:28 PM MRN: [id number] Date of Birth: 16-Feb-1945 Attending MD: Carlin POUR. Cindie , [place], [id number] CSN: [id number] Age: 78 Admit Type: Inpatient Procedure:                Upper GI endoscopy Indications:              Acute post hemorrhagic anemia, Coffee-ground emesis Providers:                Carlin POUR. Cindie, DO, Emilee Tubb RN, RN, Madelin Hunter, RN, Nidia Oak Referring MD:              Medicines:                See the Anesthesia note for documentation of the                            administered medications Complications:            No immediate complications. Estimated Blood Loss:     Estimated blood loss: none. Procedure:                Pre-Anesthesia Assessment:                           - The anesthesia plan was to use monitored                            anesthesia care (MAC).                           After obtaining informed consent, the endoscope was                            passed under direct vision. Throughout the                            procedure, the patient's blood pressure, pulse, and                            oxygen  saturations were monitored continuously. The                            [id number]-[id number] ([id number]) scope was introduced through the                            mouth, and advanced to the second part of duodenum.                            The upper GI endoscopy was accomplished without                            difficulty. The patient tolerated the procedure  well. Scope In: 2:52:36 PM Scope Out: 2:59:47 PM Total Procedure Duration: 0 hours 7 minutes 11 seconds  Findings:      LA Grade A (one or more mucosal breaks less than 5 mm, not extending       between tops of 2 mucosal folds) esophagitis with no bleeding was found       in the distal esophagus.      A medium-sized hiatal hernia was present.      Eight small  angioectasias with no bleeding were found in the gastric       body. Coagulation for bleeding prevention using argon plasma was       successful.      The duodenal bulb, first portion of the duodenum and second portion of       the duodenum were normal. Impression:               - LA Grade A reflux esophagitis with no bleeding.                           - Medium-sized hiatal hernia.                           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                            Treated with argon plasma coagulation (APC).                           - Normal duodenal bulb, first portion of the                            duodenum and second portion of the duodenum.                           - No specimens collected. Moderate Sedation:      Per Anesthesia Care Recommendation:           - Return patient to hospital ward for ongoing care.                           - Soft diet.                           - Check CBC in 1 week                           - PPI PO BID x12 weeks                           - Follow up in GI office in 3-4 weeks Procedure Code(s):        --- Professional ---                           [phone], Esophagogastroduodenoscopy, flexible,                            transoral; with control of bleeding, any method Diagnosis Code(s):        ---  Professional ---                           K21.00, Gastro-esophageal reflux disease with                            esophagitis, without bleeding                           K44.9, Diaphragmatic hernia without obstruction or                            gangrene                           K31.819, Angiodysplasia of stomach and duodenum                            without bleeding                           D62, Acute posthemorrhagic anemia                           K92.0, Hematemesis CPT copyright 2022 [employer]. All rights reserved. The codes documented in this report are preliminary and upon coder review may  be revised  to meet current compliance requirements. Carlin POUR. Cindie, DO Carlin POUR. Cindie, DO 02/21/2023 3:05:01 PM This report has been signed electronically. Number of Addenda: 0

## 2023-02-21 NOTE — Consult Note (Signed)
 Gastroenterology Consult   Referring Provider: Dr. Tobie  Primary Care Physician:  Orpha Yancey LABOR, MD Primary Gastroenterologist:  previously unassigned  Patient ID: Terry Stevens; [id number]; 05-08-1945   Admit date: 02/20/2023  LOS: 1 day   Date of Consultation: 02/21/2023  Reason for Consultation:  hematemesis   History of Present Illness   Terry Stevens is a very kind 78 y.o. year old male with a history of Alzheimers, OA, DM, HTN, CKD, B12 and folate deficiency, GERD, constipation presenting to the ED yesterday with reports of coffee-ground emesis and abdominal pain. His wife, [name], provides the majority of information due to his history of Alzheimer's and limited ability to provide details. He is able to answer simple questions at bedside with assistance of wife.    In the ED: Hgb 11.3, similar to prior several months ago, now 10.6 this morning. Creatinine 3.10, BUN 50, A1c 7.1, mild thrombocytopenia with platelets 147 yesterday and 142 this morning. Heme negative in ED.   CXR with low lung volumes, no acute cardiopulmonary disease  CT abd/pelvis WITHOUT contrast: liver unremarkable on unenhanced exam, normal spleen, moderate hiatal hernia, overall no significant findings.   Started on PPI IV BID.    Today:  Wife states that patient had 3 days of vomiting coffee-ground emesis, associated epigastric pain, with resolution of symptoms after presenting to outside hospital prior to coming to [facility]. He notes acute onset several days ago but without any hematochezia or melena. Abdominal pain, N/V now resolved. Takes Naproxen prn for chronic joint pain, with last dose day of admission. No PPI as outpatient. Chronic GERD noted, and wife feels it is managed fairly well with famotidine  in evenings. 81 mg aspirin  daily. No other NSAIDs. No prior EGD. Denies any unexplained weight loss. Some dysphagia in the past but none reported for quite awhile and unable to quantify. No dysphagia  currently. He has been NPO for several days now. Eager to eat when able.   In regards to Alzheimer's, his wife does note he has been more confused the past few days leading up to admission. Noted worsening dementia after Covid exposure in 2021. Easily turned around at home and roams. Unable to report the year but aware he is in the hospital.   No FH colon cancer, polyps  No prior EGD. Colonoscopy years ago, unsure date, no polyps. No concerning lower GI signs/symptoms.       Past Medical History:  Diagnosis Date   Arthritis    Constipation    Diabetes mellitus without complication (HCC)    GERD (gastroesophageal reflux disease)    tums prn   Gout    Hypertension    Osteoarthritis of left knee 02/27/2013    Past Surgical History:  Procedure Laterality Date   CATARACT EXTRACTION W/PHACO Right 06/13/2020   Procedure: CATARACT EXTRACTION PHACO AND INTRAOCULAR LENS PLACEMENT RIGHT EYE;  Surgeon: Harrie Agent, MD;  Location: [facility];  Service: Ophthalmology;  Laterality: Right;  CDE  8.92   CATARACT EXTRACTION W/PHACO Left 06/27/2020   Procedure: CATARACT EXTRACTION PHACO AND INTRAOCULAR LENS PLACEMENT LEFT EYE;  Surgeon: Harrie Agent, MD;  Location: [facility];  Service: Ophthalmology;  Laterality: Left;  left,  CDE=8.21   NO PAST SURGERIES     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 Authorizing Provider  allopurinol  (ZYLOPRIM ) 100 MG tablet Take 1 tablet by mouth daily.   Yes [provider]  amLODipine  (NORVASC ) 5 MG tablet Take 5 mg by mouth daily.   Yes [provider]  aspirin  81 MG tablet Take 81 mg by mouth daily.   Yes [provider]  atorvastatin  (LIPITOR) 20 MG tablet Take 20 mg by mouth daily at 6 PM.    Yes [provider]   carvedilol  (COREG ) 25 MG tablet Take 25 mg by mouth 2 (two) times daily with a meal.   Yes [provider]  donepezil  (ARICEPT ) 10 MG tablet Take 10 mg by mouth every evening.   Yes [provider]  famotidine  (PEPCID ) 20 MG tablet Take 20 mg by mouth at bedtime.   Yes [provider]  folic acid  (FOLVITE ) 1 MG tablet Take 1 tablet by mouth daily. 01/25/23  Yes [provider]  LANTUS  SOLOSTAR 100 UNIT/ML Solostar Pen Inject 20 Units into the skin 2 (two) times daily.   Yes [provider]  memantine  (NAMENDA ) 10 MG tablet Take 10 mg by mouth 2 (two) times daily.   Yes [provider]  naproxen sodium (ALEVE) 220 MG tablet Take 440 mg by mouth daily as needed (pain).   Yes [provider]  Omega-3 Fatty Acids (FISH OIL ) 1000 MG CAPS Take 1,000 mg by mouth 2 (two) times daily.   Yes [provider]  spironolactone  (ALDACTONE ) 25 MG tablet Take 1 tablet by mouth daily.   Yes [provider]  terazosin  (HYTRIN ) 5 MG capsule Take 5 mg by mouth at bedtime.   Yes [provider]    Current Facility-Administered Medications  Medication Dose Route Frequency Provider Last Rate Last Admin   acetaminophen  (TYLENOL ) tablet 650 mg  650 mg Oral Q6H PRN Patel, Ekta V, MD       Or   acetaminophen  (TYLENOL ) suppository 650 mg  650 mg Rectal Q6H PRN Patel, Ekta V, MD       allopurinol  (ZYLOPRIM ) tablet 50 mg  50 mg Oral BID Patel, Ekta V, MD       atorvastatin  (LIPITOR) tablet 20 mg  20 mg Oral q1800 Tobie Mario GAILS, MD   20 mg at 02/20/23 1755   Chlorhexidine  Gluconate Cloth 2 % PADS 6 each  6 each Topical Q0600 Tobie Mario GAILS, MD       dextrose  5 % solution   Intravenous Continuous Maree Bracken D, DO 40 mL/hr at 02/21/23 0901 New Bag at 02/21/23 0901   donepezil  (ARICEPT ) tablet 10 mg  10 mg Oral QPM Patel, Ekta V, MD   10 mg at 02/20/23 1755   insulin  aspart (novoLOG ) injection 0-6 Units  0-6 Units Subcutaneous Q4H PRN  Tobie Mario GAILS, MD       memantine  (NAMENDA ) tablet 10 mg  10 mg Oral BID Patel, Ekta V, MD   10 mg at 02/20/23 2146   morphine  (PF) 2 MG/ML injection 2 mg  2 mg Intravenous Q2H PRN Patel, Ekta V, MD       ondansetron  (ZOFRAN ) tablet 4 mg  4 mg Oral Q6H PRN Patel, Ekta V, MD       Or   ondansetron  (ZOFRAN ) injection 4 mg  4 mg Intravenous Q6H PRN Patel, Ekta V, MD       pantoprazole  (PROTONIX ) injection 40 mg  40 mg Intravenous Q12H Shah, Pratik D, DO   40 mg at 02/21/23 [phone]  Allergies as of 02/20/2023   (No Known Allergies)    History reviewed. No pertinent family history.  Social History   Socioeconomic History   Marital status: Married    Spouse name: Not on file   Number of children: Not on file   Years of education: Not on file   Highest education level: Not on file  Occupational History   Not on file  Tobacco Use   Smoking status: Former    Current packs/day: 0.00    Types: Cigarettes    Quit date: 06/06/1985    Years since quitting: 37.7   Smokeless tobacco: Never   Tobacco comments:    quit in late 1980's  Vaping Use   Vaping status: Never Used  Substance and Sexual Activity   Alcohol use: No   Drug use: No   Sexual activity: Yes  Other Topics Concern   Not on file  Social History Narrative   Not on file   Social Drivers of Health   Financial Resource Strain: Not on file  Food Insecurity: No Food Insecurity (02/20/2023)   Hunger Vital Sign    Worried About Running Out of Food in the Last Year: Never true    Ran Out of Food in the Last Year: Never true  Transportation Needs: No Transportation Needs (02/20/2023)   PRAPARE - Administrator, Civil Service (Medical): No    Lack of Transportation (Non-Medical): No  Physical Activity: Not on file  Stress: Not on file  Social Connections: Socially Integrated (02/20/2023)   Social Connection and Isolation Panel [NHANES]    Frequency of Communication with Friends and Family: More than three times a  week    Frequency of Social Gatherings with Friends and Family: More than three times a week    Attends Religious Services: More than 4 times per year    Active Member of [employer] or Organizations: Yes    Attends Banker Meetings: 1 to 4 times per year    Marital Status: Married  Catering Manager Violence: Not At Risk (02/20/2023)   Humiliation, Afraid, Rape, and Kick questionnaire    Fear of Current or Ex-Partner: No    Emotionally Abused: No    Physically Abused: No    Sexually Abused: No     Review of Systems   Gen: Denies any fever, chills, loss of appetite, change in weight or weight loss CV: Denies chest pain, heart palpitations, syncope, edema  Resp: Denies shortness of breath with rest, cough, wheezing, coughing up blood, and pleurisy. GI: Denies vomiting blood, jaundice, and fecal incontinence.   Denies dysphagia or odynophagia. GU : Denies urinary burning, blood in urine, urinary frequency, and urinary incontinence. MS: Denies joint pain, limitation of movement, swelling, cramps, and atrophy.  Derm: Denies rash, itching, dry skin, hives. Psych: Denies depression, anxiety, memory loss, hallucinations, and confusion. Heme: Denies bruising or bleeding Neuro:  Denies any headaches, dizziness, paresthesias, shaking  Physical Exam   Vital Signs in last 24 hours: Temp:  [97.6 F (36.4 C)-98.6 F (37 C)] 98.6 F (37 C) (01/13 0813) Pulse Rate:  [49-56] 49 (01/12 2100) Resp:  [8-19] 18 (01/13 0600) BP: (112-164)/(45-98) 134/66 (01/13 0600) SpO2:  [88 %-100 %] 100 % (01/13 0600) Weight:  [99.8 kg-101.4 kg] 101.4 kg (01/12 1751) Last BM Date : 02/18/23  General:   Alert,  Well-developed, well-nourished, pleasant and cooperative in NAD Head:  Normocephalic and atraumatic. Eyes:  Sclera clear, no icterus.    Ears:  Normal auditory acuity. Lungs:  Clear throughout to auscultation.   Heart:  S1 S2 present without murmurs Abdomen:  Soft, nontender and nondistended.  No masses, hepatosplenomegaly or hernias noted. Normal bowel sounds, without guarding, and without rebound.   Rectal: deferred   Msk:  Symmetrical without gross deformities. Normal posture. Extremities:  With mild ankle/pedal edema Neurologic:  Alert and  oriented to person, place. Unable to recall the year. Knows the month.  Skin:  Intact without significant lesions or rashes. Psych:  Alert and cooperative. Normal mood and affect.  Intake/Output from previous day: 01/12 0701 - 01/13 0700 In: 560 [P.O.:60; IV Piggyback:500] Out: 800 [Urine:800] Intake/Output this shift: No intake/output data recorded.   Labs/Studies   Recent Labs Recent Labs    02/20/23 1316 02/20/23 1831 02/21/23 0306  WBC 7.1  --  6.7  HGB 11.3* 12.1* 10.6*  HCT 37.0* 41.2 34.4*  PLT 147*  --  142*   BMET Recent Labs    02/20/23 1316 02/21/23 0306  NA 137 137  K 4.6 4.3  CL 106 105  CO2 24 23  GLUCOSE 155* 76  BUN 50* 44*  CREATININE 3.10* 2.94*  CALCIUM  8.9 8.7*   LFT Recent Labs    02/20/23 1316 02/21/23 0306  PROT 7.2 6.6  ALBUMIN 3.0* 2.7*  AST 16 16  ALT 15 15  ALKPHOS 106 98  BILITOT 0.8 0.8     Radiology/Studies CT ABDOMEN PELVIS WO CONTRAST Result Date: 02/20/2023 CLINICAL DATA:  Abdominal pain, vomiting EXAM: CT ABDOMEN AND PELVIS WITHOUT CONTRAST TECHNIQUE: Multidetector CT imaging of the abdomen and pelvis was performed following the standard protocol without IV contrast. RADIATION DOSE REDUCTION: This exam was performed according to the departmental dose-optimization program which includes automated exposure control, adjustment of the mA and/or kV according to patient size and/or use of iterative reconstruction technique. COMPARISON:  [facility] CT abdomen/pelvis dated 02/19/2023 FINDINGS: Lower chest: Emphysematous changes with scarring/atelectasis at the lung bases, motion degraded. Hepatobiliary: Unenhanced liver is unremarkable. Gallbladder is unremarkable. No  intrahepatic or extrahepatic dilatation. Pancreas: Within normal limits. Spleen: Within normal limits Adrenals/Urinary Tract: Adrenal glands are within normal limits. 2.4 cm simple left renal sinus cyst (series 2/image 35), benign (Bosniak I). No follow-up is recommended. Right kidney is within normal limits. No renal, ureteral, or bladder calculi. No hydronephrosis. Bladder is underdistended but unremarkable. Stomach/Bowel: Stomach is notable for a moderate hiatal hernia. No evidence of bowel obstruction. Normal appendix (series 2/image 61). Mild left colonic diverticulosis, without evidence of diverticulitis. Vascular/Lymphatic: No evidence of abdominal aortic aneurysm. Atherosclerotic calcifications of the abdominal aorta and branch vessels. No suspicious abdominopelvic lymphadenopathy. Reproductive: Prostate is grossly unremarkable. Other: No abdominopelvic ascites. Small fat containing periumbilical hernia (series 2/image 60). Mild fat in the right inguinal canal. Musculoskeletal: Degenerative changes of the visualized thoracolumbar spine. IMPRESSION: No CT findings to account for the patient's abdominal pain. Moderate hiatal hernia. Additional ancillary findings as above. Electronically Signed   By: Pinkie Pebbles M.D.   On: 02/20/2023 20:17   DG Chest 1 View Result Date: 02/20/2023 CLINICAL DATA:  Hypoxia. EXAM: CHEST  1 VIEW COMPARISON:  Two-view chest x-ray 08/04/2022 FINDINGS: Heart size is exaggerated by low lung volumes and lordotic projection. No edema or effusion is present. No focal airspace disease is present. The visualized soft tissues and bony thorax are unremarkable. IMPRESSION: 1. Low lung volumes. 2. No acute cardiopulmonary disease. Electronically Signed   By: Lonni Necessary M.D.   On: 02/20/2023 17:08  Assessment    Damaree Sargent is a very kind 78 y.o. year old male with a history of Alzheimers diagnosed around 2021 following Covid, OA, DM, HTN, CKD, B12 and folate  deficiency, GERD, constipation presenting this admission with reports of coffee-ground emesis and abdominal pain.   Clinically, he has improved with supportive measures, and denies any further abdominal pain or outright hematemesis since this admission. Hgb 11 range, similar to prior several months ago, on arrival, with drift to 10.6 this morning. Suspect multifactorial as he has not had any further overt bleeding for several days. No known liver disease. CT reviewed as above.   Suspect hematemesis due to MW tear, possible esophagitis, gastritis, ?Cameron lesions in light of moderate hiatal hernia, NSAID-induced injury. Although wife states he reported dysphagia in the past, he currently denies this. Long-standing GERD has been managed with famotidine  and no PPI. No prior EGD on file.   Discussed diagnostic EGD with patient and wife today. He has been NPO since admission. Depending on findings, could hopefully discharge later today or tomorrow as long as tolerating diet.      Plan / Recommendations    Remain NPO Continue IV BID PPI Proceed with upper endoscopy by Dr. Cindie in near future: the risks, benefits, and alternatives have been discussed with the patient in detail. The patient states understanding and desires to proceed.  Depending on EGD findings, anticipate advancing diet and hopeful discharge thereafter as he has clinically improved since admission with supportive measures.      02/21/2023, 9:11 AM  Therisa MICAEL Stager, PhD, ANP-BC [facility] Gastroenterology

## 2023-02-21 NOTE — Anesthesia Postprocedure Evaluation (Signed)
 Anesthesia Post Note  Patient: Terry Stevens  Procedure(s) Performed: ESOPHAGOGASTRODUODENOSCOPY (EGD) WITH PROPOFOL  HOT HEMOSTASIS (ARGON PLASMA COAGULATION/BICAP)  Patient location during evaluation: PACU Anesthesia Type: MAC Level of consciousness: awake and alert Pain management: pain level controlled Vital Signs Assessment: post-procedure vital signs reviewed and stable Respiratory status: spontaneous breathing, nonlabored ventilation, respiratory function stable and patient connected to nasal cannula oxygen  Cardiovascular status: blood pressure returned to baseline and stable Postop Assessment: no apparent nausea or vomiting Anesthetic complications: no   There were no known notable events for this encounter.   Last Vitals:  Vitals:   02/21/23 1516 02/21/23 1524  BP: (!) 113/58   Pulse: 64 69  Resp: 13 11  Temp:    SpO2: 96% 97%    Last Pain:  Vitals:   02/21/23 1524  TempSrc:   PainSc: 0-No pain                 Katianna Mcclenney L Christyan Reger

## 2023-02-21 NOTE — Anesthesia Preprocedure Evaluation (Signed)
 Anesthesia Evaluation  Patient identified by MRN, date of birth, ID band Patient awake    Reviewed: Allergy & Precautions, NPO status , Patient's Chart, lab work & pertinent test results  History of Anesthesia Complications (+) history of anesthetic complications  Airway Mallampati: III  TM Distance: >3 FB Neck ROM: Full    Dental  (+) Upper Dentures, Lower Dentures   Pulmonary former smoker   Pulmonary exam normal breath sounds clear to auscultation       Cardiovascular Exercise Tolerance: Good hypertension, Pt. on medications Normal cardiovascular exam Rhythm:Regular Rate:Normal     Neuro/Psych       Dementia    GI/Hepatic ,GERD  Medicated and Controlled,,  Endo/Other  diabetes, Well Controlled, Type 2, Insulin  Dependent    Renal/GU CRFRenal diseaseStage 4 CKD     Musculoskeletal  (+) Arthritis ,    Abdominal Normal abdominal exam  (+)   Peds  Hematology  (+) Blood dyscrasia, anemia   Anesthesia Other Findings   Reproductive/Obstetrics                             Anesthesia Physical Anesthesia Plan  ASA: 3  Anesthesia Plan: MAC   Post-op Pain Management: Minimal or no pain anticipated   Induction: Intravenous  PONV Risk Score and Plan: Propofol  infusion  Airway Management Planned: Nasal Cannula and Natural Airway  Additional Equipment: None  Intra-op Plan:   Post-operative Plan:   Informed Consent: I have reviewed the patients History and Physical, chart, labs and discussed the procedure including the risks, benefits and alternatives for the proposed anesthesia with the patient or authorized representative who has indicated his/her understanding and acceptance.       Plan Discussed with: CRNA  Anesthesia Plan Comments:         Anesthesia Quick Evaluation

## 2023-02-21 NOTE — Progress Notes (Signed)
 Patient OTF for EGD procedure.        Celesta Gentile, RCS

## 2023-02-21 NOTE — Progress Notes (Signed)
 PROGRESS NOTE    Terry Stevens  FMW:[id number] DOB: 01/26/1946 DOA: 02/20/2023 PCP: Orpha Yancey LABOR, MD   Brief Narrative:    Terry Stevens is a 78 y.o. male with past medical history  of osteoarthritis on naproxen, obesity with a BMI of 33.45, DM II with no recent A1c, HTN, CKD stage 4, vitamin B12 and folate deficiency, comes today for melena coffee ground emesis that occurred yesterday pt was taken to [facility] and  initially and they left and came here instead.  Patient was admitted for evaluation of acute blood loss anemia in the setting of coffee-ground emesis with melena.  GI evaluation pending.  Assessment & Plan:   Principal Problem:   Melena Active Problems:   Acute blood loss anemia   GERD (gastroesophageal reflux disease)   Thrombocytopenia (HCC)   CKD (chronic kidney disease) stage 4, GFR 15-29 ml/min (HCC)   Diabetes mellitus without complication (HCC)   Hypertension   Sinus bradycardia   Hypoxia  Assessment and Plan:    Acute hypoxemic respiratory failure likely secondary to acute blood loss anemia with Melena and coffee-ground emesis Suspect ABLA from GI loss.  We will monitor with a repeat H&H type and screen, continue with IV PPI therapy. -Appreciate GI evaluation   GERD (gastroesophageal reflux disease) IV PPI therapy BID    Thrombocytopenia (HCC) Mild thrombocytopenia.  No history of alcohol abuse in chart.  Will monitor.  Antiplatelet therapy for DVT prophylaxis currently held and will place SCDs.   CKD (chronic kidney disease) stage 4, GFR 15-29 ml/min (HCC) Patient has CKD and creatinine has remained more or less the same there is however elevation in BUN could consider consistent with his GI bleed history.   Diabetes mellitus without complication (HCC) Currently patient will be n.p.o. and will manage with sliding scale regimen with Accu-Cheks every 4 hour.  Will obtain A1c.   Hypertension Blood pressure is low.  We will currently hold  hydralazine  and hytrin  as well.    Sinus bradycardia Sinus bradycardia noted on vitals as well as EKG otherwise EKG is normal with no ischemic changes.  Will monitor.  No beta-blockers or AV nodal blocking agents in chart on med list.   Obesity, class I BMI 33.01   DVT prophylaxis: SCDs Code Status: Full Family Communication: Wife, Terry Stevens at bedside 1/13 Disposition Plan:  Status is: Inpatient Remains inpatient appropriate because: Need for inpatient monitoring and IV medications.   Consultants:  GI  Procedures:  None  Antimicrobials:  None   Subjective: Patient seen and evaluated today with no new acute complaints or concerns. No acute concerns or events noted overnight.  Objective: Vitals:   02/21/23 0400 02/21/23 0500 02/21/23 0600 02/21/23 0813  BP: (!) 118/45 (!) 145/57 134/66   Pulse:      Resp: 10 12 18    Temp:  98.1 F (36.7 C)  98.6 F (37 C)  TempSrc:    Oral  SpO2:   100%   Weight:      Height:        Intake/Output Summary (Last 24 hours) at 02/21/2023 1132 Last data filed at 02/21/2023 1131 Gross per 24 hour  Intake 560 ml  Output 1100 ml  Net -540 ml   Filed Weights   02/20/23 1245 02/20/23 1751  Weight: 99.8 kg 101.4 kg    Examination:  General exam: Appears calm and comfortable  Respiratory system: Clear to auscultation. Respiratory effort normal. Cardiovascular system: S1 & S2 heard, RRR.  Gastrointestinal system:  Abdomen is soft Central nervous system: Alert and awake Extremities: No edema Skin: No significant lesions noted Psychiatry: Flat affect.    Data Reviewed: I have personally reviewed following labs and imaging studies  CBC: Recent Labs  Lab 02/20/23 1316 02/20/23 1831 02/21/23 0306  WBC 7.1  --  6.7  HGB 11.3* 12.1* 10.6*  HCT 37.0* 41.2 34.4*  MCV 92.3  --  92.5  PLT 147*  --  142*   Basic Metabolic Panel: Recent Labs  Lab 02/20/23 1316 02/21/23 0306  NA 137 137  K 4.6 4.3  CL 106 105  CO2 24 23   GLUCOSE 155* 76  BUN 50* 44*  CREATININE 3.10* 2.94*  CALCIUM  8.9 8.7*   GFR: Estimated Creatinine Clearance: 25.5 mL/min (A) (by C-G formula based on SCr of 2.94 mg/dL (H)). Liver Function Tests: Recent Labs  Lab 02/20/23 1316 02/21/23 0306  AST 16 16  ALT 15 15  ALKPHOS 106 98  BILITOT 0.8 0.8  PROT 7.2 6.6  ALBUMIN 3.0* 2.7*   Recent Labs  Lab 02/20/23 1316  LIPASE 27   No results for input(s): AMMONIA in the last 168 hours. Coagulation Profile: No results for input(s): INR, PROTIME in the last 168 hours. Cardiac Enzymes: No results for input(s): CKTOTAL, CKMB, CKMBINDEX, TROPONINI in the last 168 hours. BNP (last 3 results) No results for input(s): PROBNP in the last 8760 hours. HbA1C: Recent Labs    02/20/23 1831  HGBA1C 7.1*   CBG: Recent Labs  Lab 02/20/23 2016 02/21/23 0037 02/21/23 0305 02/21/23 0752 02/21/23 1109  GLUCAP 78 72 73 65* 85   Lipid Profile: No results for input(s): CHOL, HDL, LDLCALC, TRIG, CHOLHDL, LDLDIRECT in the last 72 hours. Thyroid Function Tests: No results for input(s): TSH, T4TOTAL, FREET4, T3FREE, THYROIDAB in the last 72 hours. Anemia Panel: No results for input(s): VITAMINB12, FOLATE, FERRITIN, TIBC, IRON, RETICCTPCT in the last 72 hours. Sepsis Labs: Recent Labs  Lab 02/20/23 1831  PROCALCITON 4.14    Recent Results (from the past 240 hours)  MRSA Next Gen by PCR, Nasal     Status: None   Collection Time: 02/20/23  5:51 PM   Specimen: Nasal Mucosa; Nasal Swab  Result Value Ref Range Status   MRSA by PCR Next Gen NOT DETECTED NOT DETECTED Final    Comment: (NOTE) The GeneXpert MRSA Assay (FDA approved for NASAL specimens only), is one component of a comprehensive MRSA colonization surveillance program. It is not intended to diagnose MRSA infection nor to guide or monitor treatment for MRSA infections. Test performance is not FDA approved in patients less than  38 years old. Performed at [facility], [address], [place], [place] [address]          Radiology Studies: CT ABDOMEN PELVIS WO CONTRAST Result Date: 02/20/2023 CLINICAL DATA:  Abdominal pain, vomiting EXAM: CT ABDOMEN AND PELVIS WITHOUT CONTRAST TECHNIQUE: Multidetector CT imaging of the abdomen and pelvis was performed following the standard protocol without IV contrast. RADIATION DOSE REDUCTION: This exam was performed according to the departmental dose-optimization program which includes automated exposure control, adjustment of the mA and/or kV according to patient size and/or use of iterative reconstruction technique. COMPARISON:  [facility] CT abdomen/pelvis dated 02/19/2023 FINDINGS: Lower chest: Emphysematous changes with scarring/atelectasis at the lung bases, motion degraded. Hepatobiliary: Unenhanced liver is unremarkable. Gallbladder is unremarkable. No intrahepatic or extrahepatic dilatation. Pancreas: Within normal limits. Spleen: Within normal limits Adrenals/Urinary Tract: Adrenal glands are within normal limits. 2.4 cm  simple left renal sinus cyst (series 2/image 35), benign (Bosniak I). No follow-up is recommended. Right kidney is within normal limits. No renal, ureteral, or bladder calculi. No hydronephrosis. Bladder is underdistended but unremarkable. Stomach/Bowel: Stomach is notable for a moderate hiatal hernia. No evidence of bowel obstruction. Normal appendix (series 2/image 61). Mild left colonic diverticulosis, without evidence of diverticulitis. Vascular/Lymphatic: No evidence of abdominal aortic aneurysm. Atherosclerotic calcifications of the abdominal aorta and branch vessels. No suspicious abdominopelvic lymphadenopathy. Reproductive: Prostate is grossly unremarkable. Other: No abdominopelvic ascites. Small fat containing periumbilical hernia (series 2/image 60). Mild fat in the right inguinal canal. Musculoskeletal: Degenerative changes of the visualized  thoracolumbar spine. IMPRESSION: No CT findings to account for the patient's abdominal pain. Moderate hiatal hernia. Additional ancillary findings as above. Electronically Signed   By: Pinkie Pebbles M.D.   On: 02/20/2023 20:17   DG Chest 1 View Result Date: 02/20/2023 CLINICAL DATA:  Hypoxia. EXAM: CHEST  1 VIEW COMPARISON:  Two-view chest x-ray 08/04/2022 FINDINGS: Heart size is exaggerated by low lung volumes and lordotic projection. No edema or effusion is present. No focal airspace disease is present. The visualized soft tissues and bony thorax are unremarkable. IMPRESSION: 1. Low lung volumes. 2. No acute cardiopulmonary disease. Electronically Signed   By: Lonni Necessary M.D.   On: 02/20/2023 17:08        Scheduled Meds:  allopurinol   50 mg Oral BID   atorvastatin   20 mg Oral q1800   Chlorhexidine  Gluconate Cloth  6 each Topical Q0600   donepezil   10 mg Oral QPM   memantine   10 mg Oral BID   pantoprazole  (PROTONIX ) IV  40 mg Intravenous Q12H     LOS: 1 day    Time spent: 55 minutes    Janisa Labus JONETTA Fairly, DO [facility]  If 7PM-7AM, please contact night-coverage [email or web address] 02/21/2023, 11:32 AM

## 2023-02-21 NOTE — Transfer of Care (Signed)
 Immediate Anesthesia Transfer of Care Note  Patient: Terry Stevens  Procedure(s) Performed: ESOPHAGOGASTRODUODENOSCOPY (EGD) WITH PROPOFOL  HOT HEMOSTASIS (ARGON PLASMA COAGULATION/BICAP)  Patient Location: PACU  Anesthesia Type:General  Level of Consciousness: awake, drowsy, and patient cooperative  Airway & Oxygen  Therapy: Patient Spontanous Breathing and Patient connected to face mask oxygen   Post-op Assessment: Report given to RN, Post -op Vital signs reviewed and stable, and Patient moving all extremities X 4  Post vital signs: Reviewed and stable  Last Vitals:  Vitals Value Taken Time  BP 108/66 02/21/23 1504  Temp 36.9 C 02/21/23 1504  Pulse 69 02/21/23 1505  Resp 14 02/21/23 1505  SpO2 99 % 02/21/23 1505  Vitals shown include unfiled device data.  Last Pain:  Vitals:   02/21/23 1446  TempSrc:   PainSc: 0-No pain         Complications: No notable events documented.

## 2023-02-22 ENCOUNTER — Encounter (HOSPITAL_COMMUNITY): Payer: Self-pay | Admitting: Internal Medicine

## 2023-02-22 ENCOUNTER — Other Ambulatory Visit: Payer: Self-pay | Admitting: *Deleted

## 2023-02-22 ENCOUNTER — Other Ambulatory Visit (HOSPITAL_COMMUNITY): Payer: No Typology Code available for payment source

## 2023-02-22 ENCOUNTER — Inpatient Hospital Stay (HOSPITAL_COMMUNITY): Payer: No Typology Code available for payment source

## 2023-02-22 ENCOUNTER — Telehealth: Payer: Self-pay | Admitting: Gastroenterology

## 2023-02-22 DIAGNOSIS — D649 Anemia, unspecified: Secondary | ICD-10-CM

## 2023-02-22 DIAGNOSIS — K921 Melena: Secondary | ICD-10-CM | POA: Diagnosis not present

## 2023-02-22 LAB — BASIC METABOLIC PANEL
Anion gap: 6 (ref 5–15)
BUN: 43 mg/dL — ABNORMAL HIGH (ref 8–23)
CO2: 25 mmol/L (ref 22–32)
Calcium: 8.4 mg/dL — ABNORMAL LOW (ref 8.9–10.3)
Chloride: 103 mmol/L (ref 98–111)
Creatinine, Ser: 2.95 mg/dL — ABNORMAL HIGH (ref 0.61–1.24)
GFR, Estimated: 21 mL/min — ABNORMAL LOW (ref >60)
Glucose, Bld: 144 mg/dL — ABNORMAL HIGH (ref 70–99)
Potassium: 4 mmol/L (ref 3.5–5.1)
Sodium: 134 mmol/L — ABNORMAL LOW (ref 135–145)

## 2023-02-22 LAB — CBC
HCT: 31.9 % — ABNORMAL LOW (ref 39.0–52.0)
Hemoglobin: 9.8 g/dL — ABNORMAL LOW (ref 13.0–17.0)
MCH: 28.6 pg (ref 26.0–34.0)
MCHC: 30.7 g/dL (ref 30.0–36.0)
MCV: 93 fL (ref 80.0–100.0)
Platelets: 128 10*3/uL — ABNORMAL LOW (ref 150–400)
RBC: 3.43 MIL/uL — ABNORMAL LOW (ref 4.22–5.81)
RDW: 15 % (ref 11.5–15.5)
WBC: 6.6 10*3/uL (ref 4.0–10.5)
nRBC: 0 % (ref 0.0–0.2)

## 2023-02-22 LAB — GLUCOSE, CAPILLARY
Glucose-Capillary: 108 mg/dL — ABNORMAL HIGH (ref 70–99)
Glucose-Capillary: 152 mg/dL — ABNORMAL HIGH (ref 70–99)
Glucose-Capillary: 152 mg/dL — ABNORMAL HIGH (ref 70–99)

## 2023-02-22 LAB — MAGNESIUM: Magnesium: 1.7 mg/dL (ref 1.7–2.4)

## 2023-02-22 LAB — HEMOGLOBIN AND HEMATOCRIT, BLOOD
HCT: 34.6 % — ABNORMAL LOW (ref 39.0–52.0)
Hemoglobin: 10.4 g/dL — ABNORMAL LOW (ref 13.0–17.0)

## 2023-02-22 MED ORDER — PANTOPRAZOLE SODIUM 40 MG PO TBEC: 40.0000 mg | DELAYED_RELEASE_TABLET | Freq: Two times a day (BID) | ORAL | 0 refills | Status: DC

## 2023-02-22 NOTE — Telephone Encounter (Signed)
 Tried to call pt several times, voicemail not set up. Will try pt later. Labs entered into Epic.

## 2023-02-22 NOTE — Care Management Important Message (Signed)
 Important Message  Patient Details  Name: Terry Stevens MRN: [id number] Date of Birth: 08-01-1945   Important Message Given:  N/A - LOS <3 / Initial given by admissions     Corey Harold 02/22/2023, 10:14 AM

## 2023-02-22 NOTE — Discharge Summary (Signed)
 Physician Discharge Summary  Terry Stevens FMW:[id number] DOB: Nov 25, 1945 DOA: 02/20/2023  PCP: Orpha Yancey LABOR, MD  Admit date: 02/20/2023  Discharge date: 02/22/2023  Admitted From:Home  Disposition:  Home  Recommendations for Outpatient Follow-up:  Follow up with PCP in 1-2 weeks Follow-up with GI which will be scheduled in 3-4 weeks Continue PPI twice daily for 12 weeks as prescribed and avoid NSAIDs Follow up with nephrology as scheduled later this month. Continue other home medications as prior  Home Health: None  Equipment/Devices: None  Discharge Condition:Stable  CODE STATUS: Full  Diet recommendation: Heart Healthy/carb modified  Brief/Interim Summary: Terry Stevens is a 78 y.o. male with past medical history  of osteoarthritis on naproxen, obesity with a BMI of 33.45, DM II with no recent A1c, HTN, CKD stage 4, vitamin B12 and folate deficiency, comes today for melena coffee ground emesis that occurred yesterday pt was taken to [facility] and  initially and they left and came here instead.  Patient was admitted for evaluation of acute blood loss anemia in the setting of coffee-ground emesis with melena.  Patient has undergone EGD on 1/13 with findings of multiple AVMs and underwent APC.  No other active bleeding noted and hemoglobin has remained stable.  No other acute events or concerns noted and he is now tolerating diet.  Plan is to follow-up with GI outpatient in the next 3-4 weeks.  Discharge Diagnoses:  Principal Problem:   Melena Active Problems:   Acute blood loss anemia   GERD (gastroesophageal reflux disease)   Thrombocytopenia (HCC)   CKD (chronic kidney disease) stage 4, GFR 15-29 ml/min (HCC)   Diabetes mellitus without complication (HCC)   Hypertension   Sinus bradycardia   Hypoxia   Upper GI bleed  Principal discharge diagnosis: Acute hypoxemic respiratory failure likely secondary to acute blood loss anemia due to upper GI bleed with noted  AVM.  Discharge Instructions  Discharge Instructions     Ambulatory referral to Gastroenterology   Complete by: As directed    What is the reason for referral?: Other   Diet - low sodium heart healthy   Complete by: As directed    Increase activity slowly   Complete by: As directed       Allergies as of 02/22/2023   No Known Allergies      Medication List     STOP taking these medications    naproxen sodium 220 MG tablet Commonly known as: ALEVE       TAKE these medications    allopurinol  100 MG tablet Commonly known as: ZYLOPRIM  Take 1 tablet by mouth daily.   amLODipine  5 MG tablet Commonly known as: NORVASC  Take 5 mg by mouth daily.   aspirin  81 MG tablet Take 81 mg by mouth daily.   atorvastatin  20 MG tablet Commonly known as: LIPITOR Take 20 mg by mouth daily at 6 PM.   carvedilol  25 MG tablet Commonly known as: COREG  Take 25 mg by mouth 2 (two) times daily with a meal.   donepezil  10 MG tablet Commonly known as: ARICEPT  Take 10 mg by mouth every evening.   famotidine  20 MG tablet Commonly known as: PEPCID  Take 20 mg by mouth at bedtime.   Fish Oil  1000 MG Caps Take 1,000 mg by mouth 2 (two) times daily.   folic acid  1 MG tablet Commonly known as: FOLVITE  Take 1 tablet by mouth daily.   Lantus  SoloStar 100 UNIT/ML Solostar Pen Generic drug: insulin  glargine Inject 20 Units  into the skin 2 (two) times daily.   memantine  10 MG tablet Commonly known as: NAMENDA  Take 10 mg by mouth 2 (two) times daily.   pantoprazole  40 MG tablet Commonly known as: Protonix  Take 1 tablet (40 mg total) by mouth 2 (two) times daily.   spironolactone  25 MG tablet Commonly known as: ALDACTONE  Take 1 tablet by mouth daily.   terazosin  5 MG capsule Commonly known as: HYTRIN  Take 5 mg by mouth at bedtime.        Follow-up Information     Orpha Yancey LABOR, MD. Schedule an appointment as soon as possible for a visit in 1 week(s).   Specialty:  Internal Medicine Contact information: [address] [place] [address] [phone]         [facility]. Go to.   Contact information: [address] [place]   [phone]               No Known Allergies  Consultations: GI   Procedures/Studies: CT ABDOMEN PELVIS WO CONTRAST Result Date: 02/20/2023 CLINICAL DATA:  Abdominal pain, vomiting EXAM: CT ABDOMEN AND PELVIS WITHOUT CONTRAST TECHNIQUE: Multidetector CT imaging of the abdomen and pelvis was performed following the standard protocol without IV contrast. RADIATION DOSE REDUCTION: This exam was performed according to the departmental dose-optimization program which includes automated exposure control, adjustment of the mA and/or kV according to patient size and/or use of iterative reconstruction technique. COMPARISON:  [facility] CT abdomen/pelvis dated 02/19/2023 FINDINGS: Lower chest: Emphysematous changes with scarring/atelectasis at the lung bases, motion degraded. Hepatobiliary: Unenhanced liver is unremarkable. Gallbladder is unremarkable. No intrahepatic or extrahepatic dilatation. Pancreas: Within normal limits. Spleen: Within normal limits Adrenals/Urinary Tract: Adrenal glands are within normal limits. 2.4 cm simple left renal sinus cyst (series 2/image 35), benign (Bosniak I). No follow-up is recommended. Right kidney is within normal limits. No renal, ureteral, or bladder calculi. No hydronephrosis. Bladder is underdistended but unremarkable. Stomach/Bowel: Stomach is notable for a moderate hiatal hernia. No evidence of bowel obstruction. Normal appendix (series 2/image 61). Mild left colonic diverticulosis, without evidence of diverticulitis. Vascular/Lymphatic: No evidence of abdominal aortic aneurysm. Atherosclerotic calcifications of the abdominal aorta and branch vessels. No suspicious abdominopelvic lymphadenopathy. Reproductive: Prostate is grossly  unremarkable. Other: No abdominopelvic ascites. Small fat containing periumbilical hernia (series 2/image 60). Mild fat in the right inguinal canal. Musculoskeletal: Degenerative changes of the visualized thoracolumbar spine. IMPRESSION: No CT findings to account for the patient's abdominal pain. Moderate hiatal hernia. Additional ancillary findings as above. Electronically Signed   By: Pinkie Pebbles M.D.   On: 02/20/2023 20:17   DG Chest 1 View Result Date: 02/20/2023 CLINICAL DATA:  Hypoxia. EXAM: CHEST  1 VIEW COMPARISON:  Two-view chest x-ray 08/04/2022 FINDINGS: Heart size is exaggerated by low lung volumes and lordotic projection. No edema or effusion is present. No focal airspace disease is present. The visualized soft tissues and bony thorax are unremarkable. IMPRESSION: 1. Low lung volumes. 2. No acute cardiopulmonary disease. Electronically Signed   By: Lonni Necessary M.D.   On: 02/20/2023 17:08     Discharge Exam: Vitals:   02/22/23 0057 02/22/23 0415  BP: (!) 126/58 (!) 140/73  Pulse: (!) 58 87  Resp: 18 19  Temp: 98.1 F (36.7 C) 98.7 F (37.1 C)  SpO2: 99% 97%   Vitals:   02/21/23 2205 02/22/23 0057 02/22/23 0405 02/22/23 0415  BP: 119/61 (!) 126/58  (!) 140/73  Pulse: (!) 57 (!) 58  87  Resp: 19 18  19   Temp: 98.4 F (36.9 C) 98.1 F (36.7 C)  98.7 F (37.1 C)  TempSrc: Oral Oral  Oral  SpO2: 99% 99%  97%  Weight:   101.3 kg   Height:        General: Pt is alert, awake, not in acute distress Cardiovascular: RRR, S1/S2 +, no rubs, no gallops Respiratory: CTA bilaterally, no wheezing, no rhonchi Abdominal: Soft, NT, ND, bowel sounds + Extremities: no edema, no cyanosis    The results of significant diagnostics from this hospitalization (including imaging, microbiology, ancillary and laboratory) are listed below for reference.     Microbiology: Recent Results (from the past 240 hours)  MRSA Next Gen by PCR, Nasal     Status: None   Collection Time:  02/20/23  5:51 PM   Specimen: Nasal Mucosa; Nasal Swab  Result Value Ref Range Status   MRSA by PCR Next Gen NOT DETECTED NOT DETECTED Final    Comment: (NOTE) The GeneXpert MRSA Assay (FDA approved for NASAL specimens only), is one component of a comprehensive MRSA colonization surveillance program. It is not intended to diagnose MRSA infection nor to guide or monitor treatment for MRSA infections. Test performance is not FDA approved in patients less than 60 years old. Performed at [facility], [address] [place], [place] [address]      Labs: BNP (last 3 results) No results for input(s): BNP in the last 8760 hours. Basic Metabolic Panel: Recent Labs  Lab 02/20/23 1316 02/21/23 0306 02/22/23 0309  NA 137 137 134*  K 4.6 4.3 4.0  CL 106 105 103  CO2 24 23 25   GLUCOSE 155* 76 144*  BUN 50* 44* 43*  CREATININE 3.10* 2.94* 2.95*  CALCIUM  8.9 8.7* 8.4*  MG  --   --  1.7   Liver Function Tests: Recent Labs  Lab 02/20/23 1316 02/21/23 0306  AST 16 16  ALT 15 15  ALKPHOS 106 98  BILITOT 0.8 0.8  PROT 7.2 6.6  ALBUMIN 3.0* 2.7*   Recent Labs  Lab 02/20/23 1316  LIPASE 27   No results for input(s): AMMONIA in the last 168 hours. CBC: Recent Labs  Lab 02/20/23 1316 02/20/23 1831 02/21/23 0306 02/22/23 0309 02/22/23 0836  WBC 7.1  --  6.7 6.6  --   HGB 11.3* 12.1* 10.6* 9.8* 10.4*  HCT 37.0* 41.2 34.4* 31.9* 34.6*  MCV 92.3  --  92.5 93.0  --   PLT 147*  --  142* 128*  --    Cardiac Enzymes: No results for input(s): CKTOTAL, CKMB, CKMBINDEX, TROPONINI in the last 168 hours. BNP: Invalid input(s): POCBNP CBG: Recent Labs  Lab 02/21/23 1658 02/21/23 2214 02/22/23 0023 02/22/23 0418 02/22/23 0753  GLUCAP 94 144* 152* 152* 108*   D-Dimer No results for input(s): DDIMER in the last 72 hours. Hgb A1c Recent Labs    02/20/23 1831  HGBA1C 7.1*   Lipid Profile No results for input(s): CHOL, HDL, LDLCALC, TRIG, CHOLHDL,  LDLDIRECT in the last 72 hours. Thyroid function studies No results for input(s): TSH, T4TOTAL, T3FREE, THYROIDAB in the last 72 hours.  Invalid input(s): FREET3 Anemia work up No results for input(s): VITAMINB12, FOLATE, FERRITIN, TIBC, IRON, RETICCTPCT in the last 72 hours. Urinalysis    Component Value Date/Time   COLORURINE YELLOW 02/20/2023 1247   APPEARANCEUR CLEAR 02/20/2023 1247   LABSPEC 1.016 02/20/2023 1247   PHURINE 5.0 02/20/2023 1247   GLUCOSEU NEGATIVE 02/20/2023 1247  HGBUR NEGATIVE 02/20/2023 1247   BILIRUBINUR NEGATIVE 02/20/2023 1247   KETONESUR NEGATIVE 02/20/2023 1247   PROTEINUR NEGATIVE 02/20/2023 1247   NITRITE NEGATIVE 02/20/2023 1247   LEUKOCYTESUR NEGATIVE 02/20/2023 1247   Sepsis Labs Recent Labs  Lab 02/20/23 1316 02/21/23 0306 02/22/23 0309  WBC 7.1 6.7 6.6   Microbiology Recent Results (from the past 240 hours)  MRSA Next Gen by PCR, Nasal     Status: None   Collection Time: 02/20/23  5:51 PM   Specimen: Nasal Mucosa; Nasal Swab  Result Value Ref Range Status   MRSA by PCR Next Gen NOT DETECTED NOT DETECTED Final    Comment: (NOTE) The GeneXpert MRSA Assay (FDA approved for NASAL specimens only), is one component of a comprehensive MRSA colonization surveillance program. It is not intended to diagnose MRSA infection nor to guide or monitor treatment for MRSA infections. Test performance is not FDA approved in patients less than 87 years old. Performed at [facility], [address] [place], [place] [address]      Time coordinating discharge: 35 minutes  SIGNED:   Adron JONETTA Fairly, DO [facility] 02/22/2023, 10:00 AM  If 7PM-7AM, please contact night-coverage [email or web address]

## 2023-02-22 NOTE — Plan of Care (Signed)

## 2023-02-22 NOTE — Telephone Encounter (Signed)
 Patient is getting discharged from the hospital today. I did not see him. Reviewed chart. 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  Dr. Cindie recommended CBC in 1 week and follow-up in clinic in 3-4 weeks.   Mandy: Please arrange follow-up with Therisa Stager, NP in 3-4  weeks.   Courtney: Please arrange CBC in 1 week. Dx: Anemia.  Please contact patient to notify him of this.

## 2023-02-23 ENCOUNTER — Telehealth: Payer: Self-pay

## 2023-02-23 DIAGNOSIS — R4189 Other symptoms and signs involving cognitive functions and awareness: Secondary | ICD-10-CM

## 2023-02-23 DIAGNOSIS — N184 Chronic kidney disease, stage 4 (severe): Secondary | ICD-10-CM

## 2023-02-23 NOTE — Transitions of Care (Post Inpatient/ED Visit) (Signed)
 02/23/2023  Name: Terry Stevens MRN: [id number] DOB: 17-May-1945  Today's TOC FU Call Status: Today's TOC FU Call Status:: Successful TOC FU Call Completed TOC FU Call Complete Date: 02/23/23 Patient's Name and Date of Birth confirmed.  Transition Care Management Follow-up Telephone Call Date of Discharge: 02/22/23 Discharge [facility]: [facility] Type of Discharge: Inpatient Admission Primary Inpatient Discharge Diagnosis:: Melena How have you been since you were released from the hospital?: Better Any questions or concerns?: No  Items Reviewed: Did you receive and understand the discharge instructions provided?: Yes Medications obtained,verified, and reconciled?: Yes (Medications Reviewed) (Medication reconciliation completed based on recent discharge summary Patient taking medications as instructed and spouse aware of changes or dosage adjustments medication regimen.Spouse denies questions and reports no barriers to medication adherence) Any new allergies since your discharge?: No Dietary orders reviewed?: Yes Type of Diet Ordered:: Reg Heart Healthy Do you have support at home?: No (Spouse works patient is home alone She uses cameras  and comes home frequently during day)  Medications Reviewed Today: Medications Reviewed Today     Reviewed by Grafton Lawrence, RN (Registered Nurse) on 02/23/23 at 1018  Med List Status: <None>   Medication Order Taking? Sig Documenting Provider Last Dose Status Informant  allopurinol  (ZYLOPRIM ) 100 MG tablet [id number] Yes Take 1 tablet by mouth daily. [provider] Taking Active Spouse/Significant Other  amLODipine (NORVASC) 5 MG tablet [id number] Yes Take 5 mg by mouth daily. [provider] Taking Active Spouse/Significant Other  aspirin  81 MG tablet [id number] Yes Take 81 mg by mouth daily. [provider] Taking Active Spouse/Significant Other  atorvastatin  (LIPITOR) 20 MG tablet [id number] Yes Take 20 mg by mouth  daily at 6 PM.  [provider] Taking Active Spouse/Significant Other  carvedilol (COREG) 25 MG tablet [id number] Yes Take 25 mg by mouth 2 (two) times daily with a meal. [provider] Taking Active Spouse/Significant Other  donepezil  (ARICEPT ) 10 MG tablet [id number] Yes Take 10 mg by mouth every evening. [provider] Taking Active Spouse/Significant Other  famotidine (PEPCID) 20 MG tablet [id number] Yes Take 20 mg by mouth at bedtime. [provider] Taking Active Spouse/Significant Other  folic acid  (FOLVITE ) 1 MG tablet [id number] Yes Take 1 tablet by mouth daily. [provider] Taking Active Spouse/Significant Other  LANTUS SOLOSTAR 100 UNIT/ML Solostar Pen [id number] Yes Inject 20 Units into the skin 2 (two) times daily. [provider] Taking Active Spouse/Significant Other  memantine  (NAMENDA ) 10 MG tablet [id number] Yes Take 10 mg by mouth 2 (two) times daily. [provider] Taking Active Spouse/Significant Other  Omega-3 Fatty Acids (FISH OIL) 1000 MG CAPS [id number] Yes Take 1,000 mg by mouth 2 (two) times daily. [provider] Taking Active Spouse/Significant Other  pantoprazole  (PROTONIX ) 40 MG tablet [id number] Yes Take 1 tablet (40 mg total) by mouth 2 (two) times daily. Mason Sole, Pratik D, DO Taking Active   spironolactone (ALDACTONE) 25 MG tablet [id number] Yes Take 1 tablet by mouth daily. [provider] Taking Active Spouse/Significant Other  terazosin  (HYTRIN ) 5 MG capsule [id number] Yes Take 5 mg by mouth at bedtime. [provider] Taking Active Spouse/Significant Other          Medication reconciliation / review completed based on most recent discharge summary and EHR medication list. Confirmed patient is taking all newly prescribed medications as instructed (any discrepancies are noted in review section)   Patient / Caregiver is aware of any changes to and / or  any  dosage adjustments to  medication regimen. Patient/ Caregiver denies questions at this time and reports no barriers to medication adherence.    Home Care and Equipment/Supplies: Were Home Health Services Ordered?: No Any new equipment or medical supplies ordered?: No  Functional Questionnaire: Do you need assistance with bathing/showering or dressing?: Yes (He has Dementia and will dress inapproporiately double clothing etc.) Do you need assistance with meal preparation?: Yes (meals prepared by spouse) Do you need assistance with eating?: No Do you have difficulty maintaining continence: No Do you need assistance with getting out of bed/getting out of a chair/moving?: Yes (requires oversight SBA) Do you have difficulty managing or taking your medications?: Yes (spouse uses blister packs and administers meds)  Follow up appointments reviewed: PCP Follow-up appointment confirmed?: No (Spouse is scheduling) MD Provider Line Number:[phone] Given: No Specialist Hospital Follow-up appointment confirmed?: Yes Date of Specialist follow-up appointment?: 03/02/23 Follow-Up Specialty Provider:: Nephrology and GI 1/22 and 1/23 per spouse Do you need transportation to your follow-up appointment?: No (he is unable to-drive spouse transports) Do you understand care options if your condition(s) worsen?: Yes-patient verbalized understanding  SDOH Interventions Today    Flowsheet Row Most Recent Value  SDOH Interventions   Food Insecurity Interventions Intervention Not Indicated  Housing Interventions Intervention Not Indicated  Transportation Interventions Intervention Not Indicated, Patient Resources (Friends/Family)  Utilities Interventions Intervention Not Indicated  Social Connections Interventions Intervention Not Indicated       Goals Addressed             This Visit's Progress    TOC Care Plan       Current Barriers:  SDOH (other) Help at home for safety  Care Coordination needs related to Limited  social support, Level of care concerns, ADL IADL limitations, Cognitive Deficits, Memory Deficits, Inability to perform ADL's independently, and Inability to perform IADL's independently Lacks caregiver support Cognitive Deficits  RNCM Clinical Goal(s):  Patient will work with the Care Management team over the next 30 days to address Transition of Care Barriers: Support at home take all medications exactly as prescribed and will call provider for medication related questions as evidenced by no missed medication doses  attend all scheduled medical appointments: with PCP, Nephrology and GI as evidenced by no missed follow-up appointments  work with Child psychotherapist to address  related to the management of Limited social support, Level of care concerns, ADL IADL limitations, Cognitive Deficits, and Memory Deficits related to the management of Dementia as evidenced by review of EMR and patient or Child psychotherapist report through collaboration with Medical illustrator, provider, and care team.   Interventions: Evaluation of current treatment plan related to  self management and patient's adherence to plan as established by provider  Transitions of Care:  New goal. 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 Reviewed medications including Dementia Medication and Insulin  , Emotional Support Provided to patient/caregiver, Consideration of in-home help encouraged , Discussed importance of discussing diagnosis with provider, Discussed importance of attendance to all provider appointments, and Advised to contact provider for new or worsening  symptoms  Patient Goals/Self-Care Activities: Participate in Transition of Care Program/Attend TOC scheduled calls Take  all medications as prescribed Attend all scheduled provider appointments Call pharmacy for medication refills 3-7 days in advance of running out of medications Attend church or other social activities Call provider office for new concerns or questions  Work with the social worker to address care coordination needs and will continue to work with the clinical team to address health care and disease management related needs  Follow Up Plan:  Telephone follow up appointment with care management team member scheduled for:  03/02/23 @ 11:00am The patient has been provided with contact information for the care management team and has been advised to call with any health related questions or concerns.          Patient is at high risk for readmission and/or has history of  high utilization  Discussed VBCI  TOC program and weekly calls to patient to assess condition/status, medication management  and provide support/education as indicated . Patient/ Caregiver voiced understanding and is  agreeable to 30 day program    Routine follow-up and on-going assessment evaluation and education of disease processes, recommended interventions for both chronic and acute medical conditions , will occur during each weekly visit along with ongoing review of symptoms ,medication reviews and reconciliation. Any updates , inconsistencies, discrepancies or acute care concerns will be addressed and routed to the correct Practitioner if indicated   Based on current information and Insurance plan -Reviewed benefits available to patient, including details about eligibility options for care if any area of needs were identified.  Reviewed patients ability to access and / or navigating the benefits system..Amb Referral made if indicted , refer to orders section of note for details   Please refer to Care Plan for  goals and interventions -Effectiveness of interventions, symptom management and outcomes will be evaluated  weekly during [facility] 30-day Program Outreach calls  . Any necessary  changes and updates to Care Plan will be completed episodically    Reviewed goals for care Patient verbalizes understanding of instructions and care plan provided. Patient was encouraged to make informed decisions about their care, actively participate in managing their health condition, and implement lifestyle changes as needed to promote independence and self-management of health care    The patient has been provided with contact information for the care management team and has been advised to call with any health-related questions or concerns.  James Mcardle , BSN, [facility] Health   Population Health [email or web address] Direct Dial: [phone]

## 2023-02-23 NOTE — Telephone Encounter (Signed)
 Tried pt again, no answer. Will mail lab requisitions.

## 2023-02-23 NOTE — Patient Instructions (Signed)
 Visit Information  Thank you for taking time to visit with me today. Please don't hesitate to contact me if I can be of assistance to you before our next scheduled telephone appointment.  Our next appointment is by telephone on 03/02/23 at 11:00am   Following is a copy of your care plan:   Goals Addressed             This Visit's Progress    TOC Care Plan       Current Barriers:  SDOH (other) Help at home for safety  Care Coordination needs related to Limited social support, Level of care concerns, ADL IADL limitations, Cognitive Deficits, Memory Deficits, Inability to perform ADL's independently, and Inability to perform IADL's independently Lacks caregiver support Cognitive Deficits  RNCM Clinical Goal(s):  Patient will work with the Care Management team over the next 30 days to address Transition of Care Barriers: Support at home take all medications exactly as prescribed and will call provider for medication related questions as evidenced by no missed medication doses  attend all scheduled medical appointments: with PCP, Nephrology and GI as evidenced by no missed follow-up appointments  work with Child psychotherapist to address  related to the management of Limited social support, Level of care concerns, ADL IADL limitations, Cognitive Deficits, and Memory Deficits related to the management of Dementia as evidenced by review of EMR and patient or social worker report through collaboration with Medical illustrator, provider, and care team.   Interventions: Evaluation of current treatment plan related to  self management and patient's adherence to plan as established by provider  Transitions of Care:  New goal. 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 Reviewed medications including Dementia Medication and Insulin  , Emotional Support Provided to patient/caregiver, Consideration of in-home help encouraged , Discussed importance of discussing diagnosis with provider, Discussed importance of attendance to all provider appointments, and Advised to contact provider for new or worsening symptoms  Patient Goals/Self-Care Activities: Participate in Transition of Care Program/Attend TOC scheduled calls Take all medications as prescribed Attend all scheduled provider appointments Call pharmacy for medication refills 3-7 days in advance of running out of medications Attend church or other social activities Call provider office for new concerns or questions  Work with the social worker to address care coordination needs and will continue to work with the clinical team to address health care and disease management related needs  Follow Up Plan:  Telephone follow up appointment with care management team member scheduled for:  03/02/23 @ 11:00am The patient has been provided with contact information for the care management team and has been advised to call with any health related questions or concerns.           Reviewed goals for care Patient/ Caregiver verbalizes understanding of instructions with the plan of care . The  Patient / Caregiver was encouraged to make informed decisions about care, actively participate in managing health conditions, and implement lifestyle changes as needed to promote independence and self-management of healthcare. SDOH screenings have been completed and addressed if indicted There are no reported barriers to care.    Follow-up Plan VBCI Case Management  Nurse will provide follow-up and on-going assessment ,evaluation and education of disease processes, recommended  interventions for both chronic and acute medical conditions ,  along with ongoing review of symptoms ,medication reviews / reconciliation during each weekly call . Any updates , inconsistencies, discrepancies or acute care concerns will be addressed and routed to the correct Practitioner if indicated   [facility]  Please call the care guide team at [phone]  if you need to cancel or reschedule your appointment . For scheduled calls -Three attempts will be made to reach you -if the scheduled call is missed or  we are unable to reach the you after 3 attempts no additional outreach attempts will be made and the TOC follow-up will be closed .   If you need to speak to a Nurse you may  call me directly at the number below or if I am unavailable,and  your need is urgent  please call the main VBCI number at [phone] and ask to speak with one of the [facility] ( Transition of Care )  Nurses  .                                                                               Additionally, If you experience worsening of your symptoms, develop shortness of breath, If you are experiencing a medical emergency,  develop suicidal or homicidal thoughts you must seek medical attention immediately by calling 911 or report to your local emergency department or urgent care.   If you have a non-emergency medical problem during routine business hours, please contact your provider's office and ask to speak with a nurse.       Please take the time to read instructions/literature along with the possible adverse reactions/side effects for all the Medicines that have been prescribed to you. Only take newly prescribed  Medications after you have completely understood and accept all the possible adverse reactions/side effects.   Do not take more than prescribed Medications for  Pain, Sleep and Anxiety. Do not drive when taking Pain medications or sleep aid/ insomnia  medications It is not advisable to combine  anxiety, sleep and pain medications without talking with your primary care practitioner    If you are experiencing a Mental Health or Behavioral Health Crisis or need someone to talk to Please call the Suicide and Crisis Lifeline: 44 You may also call the [place]  National Suicide Prevention Lifeline: 917-261-5195 or TTY: 254-861-2970 TTY 770 429 8531) to talk to a trained counselor.  You may call the Behavioral Health Crisis Line at 270-784-5560, at any time, 24 hours a day, 7 days a week- however If you are in danger or need immediate medical attention, call 911.   If you would like help to quit smoking, call 1-800-QUIT-NOW ( 725-843-9100) OR Espaol: 1-855-Djelo-Ya (7-564-332-9518) o para ms informacin haga clic aqu or Text READY to 841-660 to register via text.   James Mcardle , BSN, [facility] Health   Population Health  [email or web address] Direct Dial: [phone]

## 2023-02-24 ENCOUNTER — Telehealth: Payer: Self-pay | Admitting: *Deleted

## 2023-02-24 NOTE — Progress Notes (Signed)
Complex Care Management Note Care Guide Note  02/24/2023 Name: Terry Stevens MRN: [id number] DOB: 1945/07/03   Complex Care Management Outreach Attempts: An unsuccessful telephone outreach was attempted today to offer the patient information about available complex care management services.  Follow Up Plan:  Additional outreach attempts will be made to offer the patient complex care management information and services.   Encounter Outcome:  Patient Request to Call Back  Gwenevere Ghazi  [facility]  [facility], [facility] Guide  Direct Dial: [phone]  Fax [phone]

## 2023-02-25 NOTE — Progress Notes (Signed)
Complex Care Management Note  Care Guide Note 02/25/2023 Name: Terry Stevens MRN: [id number] DOB: 18-Nov-1945  Terry Stevens is a 78 y.o. year old male who sees Hasanaj, Myra Gianotti, MD for primary care. I reached out to [name] by phone today to offer complex care management services.  [name] spouse Terry Stevens was given information about Complex Care Management services today including:   The Complex Care Management services include support from the care team which includes your Nurse Coordinator, Clinical Social Worker, or Pharmacist.  The Complex Care Management team is here to help remove barriers to the health concerns and goals most important to you. Complex Care Management services are voluntary, and the patient may decline or stop services at any time by request to their care team member.   Complex Care Management Consent Status: Patient spouse Terry Stevens agreed to services and verbal consent obtained.   Follow up plan:  Telephone appointment with complex care management team member scheduled for:  03/11/23  Encounter Outcome:  Patient Scheduled Gwenevere Ghazi  [facility]  [facility], [facility] Guide  Direct Dial: [phone]  Fax [phone]

## 2023-03-02 ENCOUNTER — Ambulatory Visit: Payer: No Typology Code available for payment source | Admitting: Internal Medicine

## 2023-03-02 ENCOUNTER — Other Ambulatory Visit: Payer: Self-pay

## 2023-03-02 DIAGNOSIS — E1121 Type 2 diabetes mellitus with diabetic nephropathy: Secondary | ICD-10-CM | POA: Diagnosis not present

## 2023-03-02 DIAGNOSIS — E1122 Type 2 diabetes mellitus with diabetic chronic kidney disease: Secondary | ICD-10-CM | POA: Diagnosis not present

## 2023-03-02 DIAGNOSIS — R809 Proteinuria, unspecified: Secondary | ICD-10-CM | POA: Diagnosis not present

## 2023-03-02 DIAGNOSIS — N184 Chronic kidney disease, stage 4 (severe): Secondary | ICD-10-CM | POA: Diagnosis not present
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
## 2023-03-02 NOTE — Patient Instructions (Signed)
Visit Information  Thank you for taking time to visit with me today. Please don't hesitate to contact me if I can be of assistance to you before our next scheduled telephone appointment.  Our next appointment is by telephone on 03/11/23 at 2:30pm  Following is a copy of your care plan:   Goals Addressed             This Visit's Progress    TOC Care Plan       Current Barriers:  SDOH (other) Help at home for safety  Care Coordination needs related to Limited social support, Level of care concerns, ADL IADL limitations, Cognitive Deficits, Memory Deficits, Inability to perform ADL's independently, and Inability to perform IADL's independently Lacks caregiver support Cognitive Deficits  RNCM Clinical Goal(s):  Patient will work with the Care Management team over the next 30 days to address Transition of Care Barriers: Support at home take all medications exactly as prescribed and will call provider for medication related questions as evidenced by no missed medication doses  attend all scheduled medical appointments: with PCP, Nephrology and GI as evidenced by no missed follow-up appointments  work with Child psychotherapist to address  related to the management of Limited social support, Level of care concerns, ADL IADL limitations, Cognitive Deficits, and Memory Deficits related to the management of Dementia as evidenced by review of EMR and patient or social worker report through collaboration with Medical illustrator, provider, and care team.   Interventions: Evaluation of current treatment plan related to  self management and patient's adherence to plan as established by provider  Transitions of Care:  Goal on track:  Yes. 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 Reviewed medications including Dementia Medication and Insulin , Emotional Support Provided to patient/caregiver, Consideration of in-home help encouraged , Discussed importance of discussing diagnosis with provider, Discussed importance of attendance to all provider appointments, and Advised to contact provider for new or worsening symptoms  Patient Goals/Self-Care Activities: Participate in Transition of Care Program/Attend TOC scheduled calls Take all medications as prescribed Attend all scheduled provider appointments Call pharmacy for medication refills 3-7 days in advance of running out of medications Attend church or other social activities Call provider office for new concerns or questions  Work with the social worker to address care coordination needs and will continue to work with the clinical team to address health care and disease management related needs  Follow Up Plan:  Telephone follow up appointment with care management team member scheduled for:  03/11/23 @ 2:30pm The patient has been provided with contact information for the care management team and has been advised to call with any health related questions or concerns.           Reviewed goals for care Patient/ Caregiver verbalizes understanding of instructions with the plan of care . The  Patient / Caregiver was encouraged to make informed decisions about care, actively participate in managing health conditions, and implement lifestyle changes as needed to promote independence and self-management of healthcare. SDOH screenings have been completed and addressed if indicted There are no reported barriers to care.  Follow-up Plan [facility] Case Management Nurse will provide follow-up and on-going assessment ,evaluation and education of disease processes, recommended  interventions for both chronic and acute medical conditions ,  along with ongoing review of symptoms ,medication reviews / reconciliation during each weekly call . Any updates , inconsistencies, discrepancies or acute care concerns will be addressed and routed to the correct Practitioner if indicated   [facility]  Please call the care guide team at [phone]  if you need to cancel or reschedule your appointment . For scheduled calls -Three attempts will be made to reach you -if the scheduled call is missed or  we are unable to reach the you after 3 attempts no additional outreach attempts will be made and the TOC follow-up will be closed .   If you need to speak to a Nurse you may  call me directly at the number below or if I am unavailable,and  your need is urgent  please call the main [facility] number at [phone] and ask to speak with one of the [facility] ( Transition of Care )  Nurses  .                                                                               Additionally, If you experience worsening of your symptoms, develop shortness of breath, If you are experiencing a medical emergency,  develop suicidal or homicidal thoughts you must seek medical attention immediately by calling 911 or report to your local emergency department or urgent care.   If you have a non-emergency medical problem during routine business hours, please contact your provider's office and ask to speak with a nurse.       Please take the time to read instructions/literature along with the possible adverse reactions/side effects for all the Medicines that have been prescribed to you. Only take newly prescribed  Medications after you have completely understood and accept all the possible adverse reactions/side effects.   Do not take more than prescribed Medications for  Pain, Sleep and Anxiety. Do not drive when taking Pain medications or sleep aid/ insomnia  medications It is not advisable to combine  anxiety, sleep and pain medications without talking with your primary care practitioner    If you are experiencing a Mental Health or Behavioral Health Crisis or need someone to talk to Please call the Suicide and Crisis Lifeline: 988 You may also call the [place] National Suicide Prevention Lifeline: 931-146-2010 or TTY: 450-877-1819 TTY 951-142-3401) to talk to a trained counselor.  You may call the Behavioral Health Crisis Line at (337)264-3584, at any time, 24 hours a day, 7 days a week- however If you are in danger or need immediate medical attention, call 911.   If you would like help to quit smoking, call 1-800-QUIT-NOW ( (570)166-3279) OR Espaol: 1-855-Djelo-Ya (7-564-332-9518) o para ms informacin haga clic aqu or Text READY to 841-660 to register via text.   Susa Loffler , BSN, RN [facility]   [facility] RN Care Manager Direct Dial [phone] Fax [phone] Website: [email or web address]

## 2023-03-09 DIAGNOSIS — I10 Essential (primary) hypertension: Secondary | ICD-10-CM | POA: Diagnosis not present

## 2023-03-09 DIAGNOSIS — Z6833 Body mass index (BMI) 33.0-33.9, adult: Secondary | ICD-10-CM | POA: Diagnosis not present

## 2023-03-09 DIAGNOSIS — F5101 Primary insomnia: Secondary | ICD-10-CM | POA: Diagnosis not present

## 2023-03-09 DIAGNOSIS — Z0000 Encounter for general adult medical examination without abnormal findings: Secondary | ICD-10-CM | POA: Diagnosis not present

## 2023-03-09 DIAGNOSIS — K2091 Esophagitis, unspecified with bleeding: Secondary | ICD-10-CM | POA: Diagnosis not present

## 2023-03-11 ENCOUNTER — Ambulatory Visit: Payer: Self-pay | Admitting: *Deleted

## 2023-03-11 ENCOUNTER — Other Ambulatory Visit: Payer: Self-pay

## 2023-03-11 NOTE — Patient Outreach (Signed)
  Care Management  Transitions of Care Program Transitions of Care Post-discharge week 2  03/11/2023 Name: Terry Stevens MRN: [id number] DOB: [birth year]  Subjective: Terry Stevens is a 78 y.o. year old male who is a primary care patient of Hasanaj, Myra Gianotti, MD. The Care Management team was unable to reach the patient by phone to assess and address transitions of care needs.   Outreach attempt was during  [facility]  30-day TOC program. Pt is eligible for program due to potential risk for readmission and/or high utilization. Unfortunately, I was not able to speak with the patient in regards to recent hospital discharge. The voice mail is nit set up therefore I was nit able to leave a message     Plan: Additional outreach attempts will be made to reach the patient enrolled in the [facility] Program (Post Inpatient/ED Visit).  Susa Loffler , BSN, RN [facility]   [facility] RN Care Manager Direct Dial [phone]  Fax: [phone] Website: [email or web address]

## 2023-03-13 ENCOUNTER — Encounter: Payer: Self-pay | Admitting: *Deleted
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
## 2023-03-14 ENCOUNTER — Telehealth: Payer: Self-pay

## 2023-03-14 NOTE — Patient Outreach (Signed)
Care Management  Transitions of Care Program Transitions of Care Post-discharge week 4   03/14/2023 Name: Terry Stevens MRN: [id number] DOB: 05-11-1945  Subjective: Terry Stevens is a 78 y.o. year old male who is a primary care patient of Hasanaj, Myra Gianotti, MD. The Care Management team Engaged with patient Engaged with patient by telephone to assess and address transitions of care needs.   Consent to Services:  Patient was given information about care management services, agreed to services, and gave verbal consent to participate.   Assessment:   Patient/ Caregiver  voices no new complaints or concerns  and has not developed/ reported any new medical issues / Dx or acute changes. - since last follow-up call for most recent Hospital stay     1/12-1/14 / 2025 Spoke with spouse Terry Stevens. Sh stated things are going well the biggest concern is securing help at home especially when she is working. She is working with [employer] SW.on finding help at home she also mentioned she needs  assistance applying for MA for her husband and will discuss with SW on next call. She received a letter stating his Lantus s no longer on the [employer]. She was sent an additional vial and will take letter to Dr Olena Leatherwood for review as he is the prescriber. . She is continuing  his Protonix until, seen by GI 2/13 She stated there were no other concerns at this time  Medication reconciliation/ review completed based on most recent medication list in EHR; and in review of recent Provider follow-up appointments- confirmed patient obtained / is taking all newly prescribed medications as instructed and is aware of any changes to previous medications regimen including dosage adjustments.Caregiver manages medications; denies questions/ concerns or barriers to medication adherence at this time   Caregiver educated on red flag s/s to watch for based on current discharge diagnosis and was encouraged to report, any changes in baseline or   medication regimen,   or any new unmanaged side effects or symptoms not relieved with interventions  to PCP and / or the  VBCI Case Management team .          SDOH Interventions    Flowsheet Row Care Coordination from 03/11/2023 in [facility] Care Coordination Telephone from 02/23/2023 in [place] POPULATION [facility] DEPARTMENT  SDOH Interventions    Food Insecurity Interventions Intervention Not Indicated Intervention Not Indicated  Housing Interventions Intervention Not Indicated Intervention Not Indicated  Transportation Interventions Intervention Not Indicated, Programmer, applications Provided, Patient Resources (Friends/Family) Intervention Not Indicated, Patient Resources (Friends/Family)  Utilities Interventions Intervention Not Indicated Intervention Not Indicated  Alcohol Usage Interventions Intervention Not Indicated (Score <7) --  Financial Strain Interventions Intervention Not Indicated --  Physical Activity Interventions Community Resources Provided, Patient Declined --  Stress Interventions Intervention Not Indicated --  Social Connections Interventions Intervention Not Indicated Intervention Not Indicated  Health Literacy Interventions Intervention Not Indicated --        Goals Addressed             This Visit's Progress    TOC Care Plan       Current Barriers:  SDOH (other) Help at home for safety  Care Coordination needs related to Limited social support, Level of care concerns, ADL IADL limitations, Cognitive Deficits, Memory Deficits, Inability to perform ADL's independently, and Inability to perform IADL's independently Lacks caregiver support Cognitive Deficits  RNCM Clinical Goal(s):  Patient will work with the Care Management team over the next 30 days  to address Transition of Care Barriers: Support at home take all medications exactly as prescribed and will call provider for medication related questions as evidenced by no missed  medication doses  attend all scheduled medical appointments: with PCP, Nephrology and GI as evidenced by no missed follow-up appointments  work with Child psychotherapist to address  related to the management of Limited social support, Level of care concerns, ADL IADL limitations, Cognitive Deficits, and Memory Deficits related to the management of Dementia as evidenced by review of EMR and patient or social worker report through collaboration with Medical illustrator, provider, and care team.   Interventions: Evaluation of current treatment plan related to  self management and patient's adherence to plan as established by provider  Transitions of Care:  Goal on track:  Yes. 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 Reviewed medications including Dementia Medication and Insulin , Emotional Support Provided to patient/caregiver, Consideration of in-home help encouraged , Discussed importance of discussing diagnosis with provider, Discussed importance of attendance to all provider appointments, and Advised to contact provider for new or worsening symptoms  Patient Goals/Self-Care Activities: Participate in Transition of Care Program/Attend TOC scheduled calls Take all medications as prescribed Attend all scheduled provider appointments Call pharmacy for medication refills 3-7 days in advance of running out of medications Attend church or other social activities Call provider office for new concerns or questions  Work with the social worker to address care coordination needs and will continue to work with the clinical team to  address health care and disease management related needs  Follow Up Plan:  Telephone follow up appointment with care management team member scheduled for:  03/25/23 @ 11:00am The patient has been provided with contact information for the care management team and has been advised to call with any health related questions or concerns.          Plan:  Routine follow-up and on-going assessment evaluation and education of disease processes, recommended interventions for both chronic and acute medical conditions , will occur during each weekly visit along with ongoing review of symptoms ,medication reviews and reconciliation. Any updates , inconsistencies, discrepancies or acute care concerns will be addressed and routed to the correct Practitioner if indicated   Based on current information and Insurance plan -Reviewed benefits available to patient, including details about eligibility options for care if any area of needs were identified.  Reviewed patients ability to access and / or navigating the benefits system..Amb Referral made if indicted , refer to orders section of note for details   Please refer to Care Plan for goals and interventions -Effectiveness of interventions, symptom management and outcomes will be evaluated  weekly during [facility] 30-day Program Outreach calls  . Any necessary  changes and updates to Care Plan will be completed episodically    Reviewed goals for care Patient verbalizes understanding of instructions and care plan provided. Patient was encouraged to make informed decisions about their care, actively participate in managing their health condition, and implement lifestyle changes as needed to promote independence and self-management of health care   Patient was encouraged to Contact PCP with any changes  in baseline or  medication regimen,  changes in health status  /  well-being, safety concerns, including falls any questions or concerns regarding ongoing medical care, any  difficulty obtaining or picking up prescriptions, any changes or worsening in condition- including  symptoms not relieved  with interventions    The patient has been provided with contact information for the care management team and has been advised to call with any health-related questions or concerns. Follow up as indicated with Care Team , or sooner should any new problems arise.   The patient has been provided with contact information for the care management team and has been advised to call with any health related questions or concerns.   Susa Loffler , BSN, RN [facility]   VBCI-Population Health RN Care Manager Direct Dial [phone]  Fax: [phone] Website: [email or web address]

## 2023-03-21 ENCOUNTER — Other Ambulatory Visit (HOSPITAL_COMMUNITY)
Admission: RE | Admit: 2023-03-21 | Discharge: 2023-03-21 | Disposition: A | Discharge status: Home / Self Care | Payer: No Typology Code available for payment source | Source: Ambulatory Visit | Attending: Gastroenterology | Admitting: Gastroenterology

## 2023-03-21 ENCOUNTER — Ambulatory Visit: Payer: Self-pay | Admitting: *Deleted

## 2023-03-21 DIAGNOSIS — D649 Anemia, unspecified: Secondary | ICD-10-CM | POA: Insufficient documentation

## 2023-03-21 LAB — CBC WITH DIFFERENTIAL/PLATELET
Abs Immature Granulocytes: 0.02 10*3/uL (ref 0.00–0.07)
Basophils Absolute: 0 10*3/uL (ref 0.0–0.1)
Basophils Relative: 0 %
Eosinophils Absolute: 0.2 10*3/uL (ref 0.0–0.5)
Eosinophils Relative: 3 %
HCT: 35.2 % — ABNORMAL LOW (ref 39.0–52.0)
Hemoglobin: 11.1 g/dL — ABNORMAL LOW (ref 13.0–17.0)
Immature Granulocytes: 0 %
Lymphocytes Relative: 32 %
Lymphs Abs: 2.3 10*3/uL (ref 0.7–4.0)
MCH: 28.5 pg (ref 26.0–34.0)
MCHC: 31.5 g/dL (ref 30.0–36.0)
MCV: 90.3 fL (ref 80.0–100.0)
Monocytes Absolute: 0.6 10*3/uL (ref 0.1–1.0)
Monocytes Relative: 8 %
Neutro Abs: 4 10*3/uL (ref 1.7–7.7)
Neutrophils Relative %: 57 %
Platelets: 162 10*3/uL (ref 150–400)
RBC: 3.9 MIL/uL — ABNORMAL LOW (ref 4.22–5.81)
RDW: 15.3 % (ref 11.5–15.5)
WBC: 7.1 10*3/uL (ref 4.0–10.5)
nRBC: 0 % (ref 0.0–0.2)
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
## 2023-03-24 ENCOUNTER — Ambulatory Visit: Payer: No Typology Code available for payment source | Admitting: Gastroenterology

## 2023-03-25 ENCOUNTER — Other Ambulatory Visit: Payer: Self-pay

## 2023-03-25 NOTE — Patient Outreach (Signed)
Care Management  Transitions of Care Program Transitions of Care Post-discharge 30 Day Program Completion    03/25/2023 Name: Terry Stevens MRN: [id number] DOB: [birth year]  Subjective: Terry Stevens is a 78 y.o. year old male who is a primary care patient of Hasanaj, Myra Gianotti, MD. The Care Management team Engaged with patient Engaged with patient by telephone to assess and address transitions of care needs.   Consent to Services:  Patient was given information about care management services, agreed to services, and gave verbal consent to participate.   Assessment:   Patient/ Caregiver  voices no new complaints or concerns  and has not developed/ reported any new medical issues / Dx or acute changes. - since last follow-up call for most recent  Hospital stay     1/12-1/14 / 2025 He is doing well at home , less wandering. She is working with [facility] SW to complete MA application and hopes to get assistance at home. She rescheduled his GI follow-up for 3/5 due to car trouble  Medication reconciliation/ review completed based on most recent medication list in EHR; and in review of recent Provider follow-up appointments- confirmed patient obtained / is taking all newly prescribed medications as instructed and is aware of any changes to previous medications regimen including dosage adjustments. Patient self-Caregiver manages medications; denies questions/ concerns or barriers to medication adherence at this time  Patient / Caregiver educated on red flag s/s to watch for based on current discharge diagnosis and was encouraged to report, any changes in baseline or  medication regimen,   or any new unmanaged side effects or symptoms not relieved with interventions  to PCP and / or the  [facility] Case Management team .          SDOH Interventions    Flowsheet Row Care Coordination from 03/11/2023 in [facility] Care Coordination Telephone from 02/23/2023 in [place] POPULATION HEALTH  DEPARTMENT  SDOH Interventions    Food Insecurity Interventions Intervention Not Indicated Intervention Not Indicated  Housing Interventions Intervention Not Indicated Intervention Not Indicated  Transportation Interventions Intervention Not Indicated, Programmer, applications Provided, Patient Resources (Friends/Family) Intervention Not Indicated, Patient Resources (Friends/Family)  Utilities Interventions Intervention Not Indicated Intervention Not Indicated  Alcohol Usage Interventions Intervention Not Indicated (Score <7) --  Financial Strain Interventions Intervention Not Indicated --  Physical Activity Interventions Community Resources Provided, Patient Declined --  Stress Interventions Intervention Not Indicated --  Social Connections Interventions Intervention Not Indicated Intervention Not Indicated  Health Literacy Interventions Intervention Not Indicated --        Goals Addressed             This Visit's Progress    COMPLETED: TOC Care Plan       Current Barriers:  SDOH (other) Help at home for safety  Care Coordination needs related to Limited social support, Level of care concerns, ADL IADL limitations, Cognitive Deficits, Memory Deficits, Inability to perform ADL's independently, and Inability to perform IADL's independently Lacks caregiver support Cognitive Deficits  RNCM Clinical Goal(s):  Patient will work with the Care Management team over the next 30 days to address Transition of Care Barriers: Support at home take all medications exactly as prescribed and will call provider for medication related questions as evidenced by no missed medication doses  attend all scheduled medical appointments: with PCP, Nephrology and GI rescheduled for 04/13/23 as evidenced by no missed follow-up appointments  work with Child psychotherapist to address  related to the management of Limited  social support, Level of care concerns, ADL IADL limitations, Cognitive Deficits, and Memory Deficits  related to the management of Dementia as evidenced by review of EMR and patient or social worker report through collaboration with Medical illustrator, provider, and care team.   Interventions: Evaluation of current treatment plan related to  self management and patient's adherence to plan as established by provider  Transitions of Care:  Goal Met. Community Resource Referral Made to address for VBCI SW to assist with help at home for safety  Doctor Visits  - discussed the importance of doctor visits Level of Care needs assessed with patient/caregiver, reviewed with patient/caregiver, and options discussed with patient/caregiver  Dementia:  (Status:  Goal Met.)  Short Term Goal Evaluation of current treatment plan related to misuse of: Alzheimer's dementia Reviewed medications including Dementia Medication and Insulin , Emotional Support Provided to patient/caregiver, Consideration of in-home help encouraged , Discussed importance of discussing diagnosis with provider, Discussed importance of attendance to all provider appointments, and Advised to contact provider for new or worsening symptoms  Patient Goals/Self-Care Activities: Participate in Transition of Care Program/Attend TOC scheduled calls Take all medications as prescribed Attend all scheduled provider appointments Call pharmacy for medication refills 3-7 days in advance of running out of medications Attend church or other social activities Call provider office for new concerns or questions  Work with the social worker to address care coordination needs and will continue to work with the clinical team to address health care and disease management related needs  Follow Up Plan:  The patient has been provided with contact information for the care management team and has been advised to call with any health related questions or concerns.  No further follow up required: Completed 30 day TOC progtam He is followed by VBCI SW for assistance  with Help at Home and MA application           Plan:  The patient has successfully completed the 30-day TOC Program. Condition is stable No further acute needs identified at this time. Chronic conditions and ongoing care is  managed thru collaboration with  PCP,  Specialists and additional Healthcare Providers if indicated . Patient verbalized understanding of ongoing plan of care.  SDOH needs have been screened and interventions provided if identified.  Reviewed current home medications -- provided education as needed.  Previously discussed rationale of use, how/when to take medications. Patient is aware of potential side effects, and was encouraged to notify PCP for any changes in condition or signs / symptoms not relieved  with interventions.   Caregiver  will call 911 for Medical Emergencies or Life -Threatening or report to a local emergency department or urgent care.  Caregiver was encouraged to Contact PCP  with any questions or concerns regarding ongoing  medical care, any  difficulty obtaining or picking up  prescriptions, any  changes or  worsening in  condition including signs / symptoms not relieved  with interventions Follow up as indicated with the  Care Team , or sooner should any new problems arise. Will continue to be followed by VBCI SW for assistance with SDOH needs   Patient had no additional questions or concerns at this time. Current needs addressed.     The patient has been provided with contact information for the care management team and has been advised to call with any health related questions or concerns.   Susa Loffler , BSN, RN [employer] [facility]   VBCI-Population Health RN Care Radiation protection practitioner  [phone]  Fax: [phone] Website: [email or web address]

## 2023-03-30 ENCOUNTER — Ambulatory Visit: Payer: No Typology Code available for payment source | Admitting: Gastroenterology

## 2023-04-12 ENCOUNTER — Encounter (HOSPITAL_COMMUNITY): Payer: Self-pay | Admitting: Internal Medicine

## 2023-04-13 ENCOUNTER — Ambulatory Visit: Payer: No Typology Code available for payment source | Admitting: Gastroenterology

## 2023-04-13 ENCOUNTER — Encounter: Payer: Self-pay | Admitting: Gastroenterology

## 2023-04-13 VITALS — BP 124/65 | HR 57 | Temp 98.1°F | Ht 67.0 in | Wt 237.4 lb

## 2023-04-13 DIAGNOSIS — K2100 Gastro-esophageal reflux disease with esophagitis, without bleeding: Secondary | ICD-10-CM | POA: Diagnosis not present

## 2023-04-13 DIAGNOSIS — Z8719 Personal history of other diseases of the digestive system: Secondary | ICD-10-CM | POA: Diagnosis not present

## 2023-04-13 NOTE — Progress Notes (Signed)
 Gastroenterology Office Note     Primary Care Physician:  Toma Deiters, MD  Primary Gastroenterologist:   Chief Complaint   Chief Complaint  Patient presents with   Hospitalization Follow-up    Patient here today for a hospital follow up from 02/20/2023 due to an Upper Gi bleed. Patient is asymptomatic for anemia. Has no current gi issues. Patient would like to know if he needs to continue to take pantoprazole 40 mg bid, he was told to stop it by Dr. Olena Leatherwood.     History of Present Illness   Terry Stevens is a 78 y.o. male presenting today with a history of Alzheimers, OA, DM, HTN, CKD, B12 and folate deficiency, GERD, constipation presenting to [facility] ED In Jan 2025 with reports of abdominal pain and coffee-ground emesis. 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 He had been taking Naproxen consistently prior to this.   Discharge Hgb 10.4, improved to 11.1 as of 03/21/23. This is his baseline from review of Epic.  Daughter present with patient today. Due to history of Alzheimer's, she provides majority of information. Patient does note some intermittent reflux. He only took 10 days of pantoprazole as was told by PCP to stop after 10 days. Recommendations had been for 12 weeks. No dysphagia. No overt GI bleeding. No abdominal pain. Good appetite. Likes lots of fruits and veggies. No changes in bowel habits. No further NSAIDs other than 81 mg aspirin.    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   Colonoscopy many years ago but patient unsure the date. No known polyps. No FH colon cancer or polyps.   Past Medical History:  Diagnosis Date   Arthritis    Constipation    Diabetes mellitus without  complication (HCC)    GERD (gastroesophageal reflux disease)    tums prn   Gout    Hypertension    Osteoarthritis of left knee 02/27/2013    Past Surgical History:  Procedure Laterality Date   CATARACT EXTRACTION W/PHACO Right 06/13/2020   Procedure: CATARACT EXTRACTION PHACO AND INTRAOCULAR LENS PLACEMENT RIGHT EYE;  Surgeon: Fabio Pierce, MD;  Location: [facility];  Service: Ophthalmology;  Laterality: Right;  CDE  8.92   CATARACT EXTRACTION W/PHACO Left 06/27/2020   Procedure: CATARACT EXTRACTION PHACO AND INTRAOCULAR LENS PLACEMENT LEFT EYE;  Surgeon: Fabio Pierce, MD;  Location: [facility];  Service: Ophthalmology;  Laterality: Left;  left,  CDE=8.21   ESOPHAGOGASTRODUODENOSCOPY (EGD) WITH PROPOFOL N/A 02/21/2023   Procedure: ESOPHAGOGASTRODUODENOSCOPY (EGD) WITH PROPOFOL;  Surgeon: Lanelle Bal, DO;  Location: [facility];  Service: Endoscopy;  Laterality: N/A;   HOT HEMOSTASIS  02/21/2023   Procedure: HOT HEMOSTASIS (ARGON PLASMA COAGULATION/BICAP);  Surgeon: Lanelle Bal, DO;  Location: [facility];  Service: Endoscopy;;   NO PAST SURGERIES     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     amLODipine (NORVASC) 5 MG tablet Take 5 mg by mouth daily.     aspirin 81 MG tablet Take 81 mg  by mouth daily.     atorvastatin (LIPITOR) 20 MG tablet Take 20 mg by mouth daily at 6 PM.      carvedilol (COREG) 25 MG tablet Take 25 mg by mouth 2 (two) times daily with a meal.     donepezil (ARICEPT) 10 MG tablet Take 10 mg by mouth every evening.     famotidine (PEPCID) 20 MG tablet Take 20 mg by mouth at bedtime.     folic acid (FOLVITE) 1 MG tablet Take 1 tablet by mouth daily.     LANTUS SOLOSTAR 100 UNIT/ML Solostar Pen  Inject 20 Units into the skin 2 (two) times daily.     memantine (NAMENDA) 10 MG tablet Take 10 mg by mouth 2 (two) times daily.     Omega-3 Fatty Acids (FISH OIL) 1000 MG CAPS Take 1,000 mg by mouth 2 (two) times daily.     spironolactone (ALDACTONE) 25 MG tablet Take 1 tablet by mouth daily.     terazosin (HYTRIN) 5 MG capsule Take 5 mg by mouth at bedtime.     pantoprazole (PROTONIX) 40 MG tablet Take 1 tablet (40 mg total) by mouth 2 (two) times daily. (Patient not taking: Reported on 04/13/2023) 168 tablet 0   No current facility-administered medications for this visit.    Allergies as of 04/13/2023 - Review Complete 04/13/2023  Allergen Reaction Noted   Penicillins  04/13/2023    History reviewed. No pertinent family history.  Social History   Socioeconomic History   Marital status: Married    Spouse name: Khaled Herda   Number of children: 1   Years of education: 12   Highest education level: 12th grade  Occupational History   Not on file  Tobacco Use   Smoking status: Former    Current packs/day: 0.00    Types: Cigarettes    Quit date: 06/06/1985    Years since quitting: 37.8    Passive exposure: Past   Smokeless tobacco: Never   Tobacco comments:    quit in late 1980's  Vaping Use   Vaping status: Never Used  Substance and Sexual Activity   Alcohol use: No   Drug use: No   Sexual activity: Yes    Partners: Female  Other Topics Concern   Not on file  Social History Narrative   Not on file   Social Drivers of Health   Financial Resource Strain: Low Risk  (03/13/2023)   Overall Financial Resource Strain (CARDIA)    Difficulty of Paying Living Expenses: Not hard at all  Food Insecurity: No Food Insecurity (03/13/2023)   Hunger Vital Sign    Worried About Running Out of Food in the Last Year: Never true    Ran Out of Food in the Last Year: Never true  Transportation Needs: No Transportation Needs (03/13/2023)   PRAPARE - Administrator, Civil Service  (Medical): No    Lack of Transportation (Non-Medical): No  Physical Activity: Inactive (03/13/2023)   Exercise Vital Sign    Days of Exercise per Week: 0 days    Minutes of Exercise per Session: 0 min  Stress: No Stress Concern Present (03/13/2023)   [employer] of Occupational Health - Occupational Stress Questionnaire    Feeling of Stress : Not at all  Social Connections: Socially Integrated (03/13/2023)   Social Connection and Isolation Panel [NHANES]    Frequency of Communication with Friends and Family: More than three times a week    Frequency of Social Gatherings  with Friends and Family: More than three times a week    Attends Religious Services: More than 4 times per year    Active Member of Clubs or Organizations: Yes    Attends Engineer, structural: More than 4 times per year    Marital Status: Married  Catering manager Violence: Not At Risk (03/13/2023)   Humiliation, Afraid, Rape, and Kick questionnaire    Fear of Current or Ex-Partner: No    Emotionally Abused: No    Physically Abused: No    Sexually Abused: No     Review of Systems   Gen: Denies any fever, chills, fatigue, weight loss, lack of appetite.  CV: Denies chest pain, heart palpitations, peripheral edema, syncope.  Resp: Denies shortness of breath at rest or with exertion. Denies wheezing or cough.  GI: Denies dysphagia or odynophagia. Denies jaundice, hematemesis, fecal incontinence. GU : Denies urinary burning, urinary frequency, urinary hesitancy MS: Denies joint pain, muscle weakness, cramps, or limitation of movement.  Derm: Denies rash, itching, dry skin Psych: Denies depression, anxiety, memory loss, and confusion Heme: Denies bruising, bleeding, and enlarged lymph nodes.   Physical Exam   BP 124/65 (BP Location: Right Arm, Patient Position: Sitting, Cuff Size: Normal)   Pulse (!) 57   Temp 98.1 F (36.7 C) (Temporal)   Ht 5\' 7"  (1.702 m)   Wt 237 lb 6.4 oz (107.7 kg)   BMI 37.18  kg/m  General:   Alert and oriented. Pleasant and cooperative. Well-nourished and well-developed.  Head:  Normocephalic and atraumatic. Eyes:  Without icterus Abdomen:  +BS, soft, non-tender and non-distended. No HSM noted. No guarding or rebound. No masses appreciated.  Rectal:  Deferred  Msk:  Symmetrical without gross deformities. Normal posture. Extremities:  Without edema. Neurologic:  Alert and  oriented to person and place.  Skin:  Intact without significant lesions or rashes. Psych:  Alert and cooperative. Normal mood and affect.   Assessment   Riyansh Gerstner is a 78 y.o. male presenting today with a history of Alzheimers, OA, DM, HTN, CKD, B12 and folate deficiency, GERD, constipation presenting to [facility] ED in Jan 2025 with UGI bleed, here for hospital follow-up.  UGI bleed: in setting of LA Grade A esophagitis and AVMs, non-bleeding at time of EGD but suspect exacerbated in light of chronic Naproxen. Hgb has now returned to baseline of 11 range, and he has no further UGI bleeding. He did not complete but 10 days of PPI therapy. Due to his persistent symptoms and known esophagitis, recommend taking PPI once daily for 12 weeks, and then we can reassess if ongoing therapy is needed. As he had Grade A esophagitis, may be able to wean off PPI or take as needed in future. Will need lowest dosing to manage symptoms.     PLAN    Course of pantoprazole daily for 12 weeks, then will reassess  Return thereafter (about 4 months) Call with any recurrent symptoms    Gelene Mink, PhD, ANP-BC [facility]

## 2023-04-13 NOTE — Patient Instructions (Signed)
 Let's continue pantoprazole once a day, 30 minutes before breakfast, for 12 weeks. After that, you can stop and see how you are feeling.  I will see you back in 4 months! If you continue to have heartburn or reflux, we can do the lowest dose reflux medication to help keep your symptoms under control.  Please watch for any black, tarry stool, bright red blood in stool, and call if this happens!  I enjoyed seeing you again today! I value our relationship and want to provide genuine, compassionate, and quality care. You may receive a survey regarding your visit with me, and I welcome your feedback! Thanks so much for taking the time to complete this. I look forward to seeing you again.      Gelene Mink, PhD, ANP-BC [facility]

## 2023-04-14 ENCOUNTER — Ambulatory Visit: Payer: Self-pay | Admitting: *Deleted
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
## 2023-06-08 ENCOUNTER — Other Ambulatory Visit (HOSPITAL_COMMUNITY)
Admission: RE | Admit: 2023-06-08 | Discharge: 2023-06-08 | Disposition: A | Discharge status: Home / Self Care | Source: Ambulatory Visit | Attending: Nephrology | Admitting: Nephrology

## 2023-06-08 DIAGNOSIS — R809 Proteinuria, unspecified: Secondary | ICD-10-CM | POA: Insufficient documentation

## 2023-06-08 DIAGNOSIS — D531 Other megaloblastic anemias, not elsewhere classified: Secondary | ICD-10-CM | POA: Insufficient documentation

## 2023-06-08 DIAGNOSIS — D631 Anemia in chronic kidney disease: Secondary | ICD-10-CM | POA: Diagnosis not present

## 2023-06-08 DIAGNOSIS — E211 Secondary hyperparathyroidism, not elsewhere classified: Secondary | ICD-10-CM | POA: Insufficient documentation

## 2023-06-08 DIAGNOSIS — N189 Chronic kidney disease, unspecified: Secondary | ICD-10-CM | POA: Insufficient documentation

## 2023-06-08 DIAGNOSIS — D7589 Other specified diseases of blood and blood-forming organs: Secondary | ICD-10-CM | POA: Diagnosis not present

## 2023-06-08 LAB — CBC
HCT: 36.7 % — ABNORMAL LOW (ref 39.0–52.0)
Hemoglobin: 11.4 g/dL — ABNORMAL LOW (ref 13.0–17.0)
MCH: 27.7 pg (ref 26.0–34.0)
MCHC: 31.1 g/dL (ref 30.0–36.0)
MCV: 89.3 fL (ref 80.0–100.0)
Platelets: 193 10*3/uL (ref 150–400)
RBC: 4.11 MIL/uL — ABNORMAL LOW (ref 4.22–5.81)
RDW: 15.1 % (ref 11.5–15.5)
WBC: 6.3 10*3/uL (ref 4.0–10.5)
nRBC: 0 % (ref 0.0–0.2)

## 2023-06-08 LAB — RENAL FUNCTION PANEL
Albumin: 3 g/dL — ABNORMAL LOW (ref 3.5–5.0)
Anion gap: 10 (ref 5–15)
BUN: 29 mg/dL — ABNORMAL HIGH (ref 8–23)
CO2: 24 mmol/L (ref 22–32)
Calcium: 9 mg/dL (ref 8.9–10.3)
Chloride: 104 mmol/L (ref 98–111)
Creatinine, Ser: 2.85 mg/dL — ABNORMAL HIGH (ref 0.61–1.24)
GFR, Estimated: 22 mL/min — ABNORMAL LOW (ref >60)
Glucose, Bld: 145 mg/dL — ABNORMAL HIGH (ref 70–99)
Phosphorus: 3 mg/dL (ref 2.5–4.6)
Potassium: 4.4 mmol/L (ref 3.5–5.1)
Sodium: 138 mmol/L (ref 135–145)

## 2023-06-08 LAB — IRON AND TIBC
Iron: 53 ug/dL (ref 45–182)
Saturation Ratios: 17 % — ABNORMAL LOW (ref 17.9–39.5)
TIBC: 315 ug/dL (ref 250–450)
UIBC: 262 ug/dL

## 2023-06-08 LAB — VITAMIN B12: Vitamin B-12: 275 pg/mL (ref 180–914)

## 2023-06-08 LAB — FOLATE: Folate: >40 ng/mL (ref >5.9)

## 2023-06-08 LAB — PROTEIN / CREATININE RATIO, URINE
Creatinine, Urine: 170 mg/dL
Protein Creatinine Ratio: 0.06 mg/mg{creat} (ref 0.00–0.15)
Total Protein, Urine: 11 mg/dL

## 2023-06-08 LAB — FERRITIN: Ferritin: 98 ng/mL (ref 24–336)

## 2023-06-09 DIAGNOSIS — I10 Essential (primary) hypertension: Secondary | ICD-10-CM | POA: Diagnosis not present

## 2023-06-09 DIAGNOSIS — N184 Chronic kidney disease, stage 4 (severe): Secondary | ICD-10-CM | POA: Diagnosis not present

## 2023-06-09 DIAGNOSIS — E1121 Type 2 diabetes mellitus with diabetic nephropathy: Secondary | ICD-10-CM | POA: Diagnosis not present

## 2023-06-09 DIAGNOSIS — Z6835 Body mass index (BMI) 35.0-35.9, adult: Secondary | ICD-10-CM | POA: Diagnosis not present

## 2023-06-09 DIAGNOSIS — Z0000 Encounter for general adult medical examination without abnormal findings: Secondary | ICD-10-CM | POA: Diagnosis not present

## 2023-06-09 DIAGNOSIS — M109 Gout, unspecified: Secondary | ICD-10-CM | POA: Diagnosis not present

## 2023-06-09 DIAGNOSIS — I5042 Chronic combined systolic (congestive) and diastolic (congestive) heart failure: Secondary | ICD-10-CM | POA: Diagnosis not present

## 2023-06-09 DIAGNOSIS — K2091 Esophagitis, unspecified with bleeding: Secondary | ICD-10-CM | POA: Diagnosis not present

## 2023-06-09 DIAGNOSIS — R809 Proteinuria, unspecified: Secondary | ICD-10-CM | POA: Diagnosis not present

## 2023-06-09 DIAGNOSIS — N521 Erectile dysfunction due to diseases classified elsewhere: Secondary | ICD-10-CM | POA: Diagnosis not present

## 2023-06-09 DIAGNOSIS — E1122 Type 2 diabetes mellitus with diabetic chronic kidney disease: Secondary | ICD-10-CM | POA: Diagnosis not present

## 2023-06-09 DIAGNOSIS — F5101 Primary insomnia: Secondary | ICD-10-CM | POA: Diagnosis not present

## 2023-06-09 DIAGNOSIS — K219 Gastro-esophageal reflux disease without esophagitis: Secondary | ICD-10-CM | POA: Diagnosis not present

## 2023-06-09 LAB — PARATHYROID HORMONE, INTACT (NO CA): PTH: 64 pg/mL (ref 15–65)

## 2023-08-14 ENCOUNTER — Inpatient Hospital Stay (HOSPITAL_COMMUNITY)
Admission: EM | Admit: 2023-08-14 | Discharge: 2023-08-17 | DRG: 074 | Disposition: A | Discharge status: Home / Self Care | Attending: Family Medicine | Admitting: Family Medicine

## 2023-08-14 ENCOUNTER — Other Ambulatory Visit: Payer: Self-pay

## 2023-08-14 ENCOUNTER — Emergency Department (HOSPITAL_COMMUNITY)

## 2023-08-14 ENCOUNTER — Encounter (HOSPITAL_COMMUNITY): Payer: Self-pay | Admitting: Internal Medicine

## 2023-08-14 DIAGNOSIS — R109 Unspecified abdominal pain: Secondary | ICD-10-CM | POA: Diagnosis not present

## 2023-08-14 DIAGNOSIS — K2100 Gastro-esophageal reflux disease with esophagitis, without bleeding: Secondary | ICD-10-CM | POA: Diagnosis present

## 2023-08-14 DIAGNOSIS — G309 Alzheimer's disease, unspecified: Secondary | ICD-10-CM | POA: Diagnosis present

## 2023-08-14 DIAGNOSIS — E1122 Type 2 diabetes mellitus with diabetic chronic kidney disease: Secondary | ICD-10-CM | POA: Diagnosis present

## 2023-08-14 DIAGNOSIS — N184 Chronic kidney disease, stage 4 (severe): Secondary | ICD-10-CM | POA: Diagnosis present

## 2023-08-14 DIAGNOSIS — N400 Enlarged prostate without lower urinary tract symptoms: Secondary | ICD-10-CM | POA: Diagnosis present

## 2023-08-14 DIAGNOSIS — K5730 Diverticulosis of large intestine without perforation or abscess without bleeding: Secondary | ICD-10-CM | POA: Diagnosis present

## 2023-08-14 DIAGNOSIS — K449 Diaphragmatic hernia without obstruction or gangrene: Secondary | ICD-10-CM | POA: Diagnosis present

## 2023-08-14 DIAGNOSIS — I129 Hypertensive chronic kidney disease with stage 1 through stage 4 chronic kidney disease, or unspecified chronic kidney disease: Secondary | ICD-10-CM | POA: Diagnosis present

## 2023-08-14 DIAGNOSIS — F0280 Dementia in other diseases classified elsewhere without behavioral disturbance: Secondary | ICD-10-CM | POA: Diagnosis present

## 2023-08-14 DIAGNOSIS — Z9842 Cataract extraction status, left eye: Secondary | ICD-10-CM

## 2023-08-14 DIAGNOSIS — E1143 Type 2 diabetes mellitus with diabetic autonomic (poly)neuropathy: Secondary | ICD-10-CM | POA: Diagnosis not present

## 2023-08-14 DIAGNOSIS — R1312 Dysphagia, oropharyngeal phase: Secondary | ICD-10-CM | POA: Diagnosis not present

## 2023-08-14 DIAGNOSIS — I10 Essential (primary) hypertension: Secondary | ICD-10-CM | POA: Diagnosis not present

## 2023-08-14 DIAGNOSIS — R079 Chest pain, unspecified: Secondary | ICD-10-CM | POA: Diagnosis not present

## 2023-08-14 DIAGNOSIS — Z961 Presence of intraocular lens: Secondary | ICD-10-CM | POA: Diagnosis present

## 2023-08-14 DIAGNOSIS — K222 Esophageal obstruction: Secondary | ICD-10-CM | POA: Diagnosis present

## 2023-08-14 DIAGNOSIS — Z79899 Other long term (current) drug therapy: Secondary | ICD-10-CM

## 2023-08-14 DIAGNOSIS — I2510 Atherosclerotic heart disease of native coronary artery without angina pectoris: Secondary | ICD-10-CM | POA: Diagnosis present

## 2023-08-14 DIAGNOSIS — R1310 Dysphagia, unspecified: Secondary | ICD-10-CM

## 2023-08-14 DIAGNOSIS — M109 Gout, unspecified: Secondary | ICD-10-CM | POA: Diagnosis present

## 2023-08-14 DIAGNOSIS — R112 Nausea with vomiting, unspecified: Secondary | ICD-10-CM | POA: Diagnosis not present

## 2023-08-14 DIAGNOSIS — Z96652 Presence of left artificial knee joint: Secondary | ICD-10-CM | POA: Diagnosis present

## 2023-08-14 DIAGNOSIS — E785 Hyperlipidemia, unspecified: Secondary | ICD-10-CM | POA: Diagnosis present

## 2023-08-14 DIAGNOSIS — Z87891 Personal history of nicotine dependence: Secondary | ICD-10-CM

## 2023-08-14 DIAGNOSIS — Z9841 Cataract extraction status, right eye: Secondary | ICD-10-CM

## 2023-08-14 DIAGNOSIS — K3184 Gastroparesis: Secondary | ICD-10-CM | POA: Diagnosis present

## 2023-08-14 DIAGNOSIS — D631 Anemia in chronic kidney disease: Secondary | ICD-10-CM | POA: Diagnosis present

## 2023-08-14 DIAGNOSIS — Z7982 Long term (current) use of aspirin: Secondary | ICD-10-CM

## 2023-08-14 LAB — COMPREHENSIVE METABOLIC PANEL WITH GFR
ALT: 13 U/L (ref 0–44)
AST: 14 U/L — ABNORMAL LOW (ref 15–41)
Albumin: 2.9 g/dL — ABNORMAL LOW (ref 3.5–5.0)
Alkaline Phosphatase: 97 U/L (ref 38–126)
Anion gap: 11 (ref 5–15)
BUN: 31 mg/dL — ABNORMAL HIGH (ref 8–23)
CO2: 26 mmol/L (ref 22–32)
Calcium: 8.9 mg/dL (ref 8.9–10.3)
Chloride: 102 mmol/L (ref 98–111)
Creatinine, Ser: 2.68 mg/dL — ABNORMAL HIGH (ref 0.61–1.24)
GFR, Estimated: 24 mL/min — ABNORMAL LOW (ref >60)
Glucose, Bld: 76 mg/dL (ref 70–99)
Potassium: 4.2 mmol/L (ref 3.5–5.1)
Sodium: 139 mmol/L (ref 135–145)
Total Bilirubin: 0.8 mg/dL (ref 0.0–1.2)
Total Protein: 7.3 g/dL (ref 6.5–8.1)

## 2023-08-14 LAB — CBC
HCT: 35.9 % — ABNORMAL LOW (ref 39.0–52.0)
Hemoglobin: 10.7 g/dL — ABNORMAL LOW (ref 13.0–17.0)
MCH: 26.8 pg (ref 26.0–34.0)
MCHC: 29.8 g/dL — ABNORMAL LOW (ref 30.0–36.0)
MCV: 89.8 fL (ref 80.0–100.0)
Platelets: 180 K/uL (ref 150–400)
RBC: 4 MIL/uL — ABNORMAL LOW (ref 4.22–5.81)
RDW: 15.2 % (ref 11.5–15.5)
WBC: 6.1 K/uL (ref 4.0–10.5)
nRBC: 0 % (ref 0.0–0.2)

## 2023-08-14 LAB — URINALYSIS, ROUTINE W REFLEX MICROSCOPIC
Bilirubin Urine: NEGATIVE
Glucose, UA: NEGATIVE mg/dL
Hgb urine dipstick: NEGATIVE
Ketones, ur: NEGATIVE mg/dL
Leukocytes,Ua: NEGATIVE
Nitrite: NEGATIVE
Protein, ur: NEGATIVE mg/dL
Specific Gravity, Urine: 1.01 (ref 1.005–1.030)
pH: 7 (ref 5.0–8.0)

## 2023-08-14 LAB — GLUCOSE, CAPILLARY
Glucose-Capillary: 137 mg/dL — ABNORMAL HIGH (ref 70–99)
Glucose-Capillary: 119 mg/dL — ABNORMAL HIGH (ref 70–99)

## 2023-08-14 LAB — TROPONIN I (HIGH SENSITIVITY): Troponin I (High Sensitivity): 7 ng/L (ref <18)

## 2023-08-14 LAB — HEMOGLOBIN A1C
Hgb A1c MFr Bld: 7.5 % — ABNORMAL HIGH (ref 4.8–5.6)
Mean Plasma Glucose: 168.55 mg/dL

## 2023-08-14 LAB — LIPASE, BLOOD: Lipase: 44 U/L (ref 11–51)

## 2023-08-14 MED ORDER — HEPARIN SODIUM (PORCINE) 5000 UNIT/ML IJ SOLN
5000.0000 [IU] | Freq: Three times a day (TID) | INTRAMUSCULAR | Status: DC
  Administered 2023-08-14 – 2023-08-17 (×8): 5000 [IU] via SUBCUTANEOUS
  Filled 2023-08-14 (×8): qty 1

## 2023-08-14 MED ORDER — ONDANSETRON HCL 4 MG/2ML IJ SOLN
4.0000 mg | Freq: Once | INTRAMUSCULAR | Status: AC
  Administered 2023-08-14: 4 mg via INTRAVENOUS
  Filled 2023-08-14: qty 2

## 2023-08-14 MED ORDER — FAMOTIDINE 20 MG PO TABS
20.0000 mg | ORAL_TABLET | Freq: Every day | ORAL | Status: DC
  Administered 2023-08-14 – 2023-08-16 (×3): 20 mg via ORAL
  Filled 2023-08-14 (×3): qty 1

## 2023-08-14 MED ORDER — ORAL CARE MOUTH RINSE: 15.0000 mL | OROMUCOSAL | Status: DC | PRN

## 2023-08-14 MED ORDER — ALLOPURINOL 100 MG PO TABS
100.0000 mg | ORAL_TABLET | Freq: Every day | ORAL | Status: DC
  Administered 2023-08-14 – 2023-08-17 (×4): 100 mg via ORAL
  Filled 2023-08-14 (×4): qty 1

## 2023-08-14 MED ORDER — ATORVASTATIN CALCIUM 20 MG PO TABS
20.0000 mg | ORAL_TABLET | Freq: Every day | ORAL | Status: DC
  Administered 2023-08-15 – 2023-08-16 (×2): 20 mg via ORAL
  Filled 2023-08-14 (×3): qty 1

## 2023-08-14 MED ORDER — ACETAMINOPHEN 650 MG RE SUPP: 650.0000 mg | Freq: Four times a day (QID) | RECTAL | Status: DC | PRN

## 2023-08-14 MED ORDER — MEMANTINE HCL 10 MG PO TABS
10.0000 mg | ORAL_TABLET | Freq: Two times a day (BID) | ORAL | Status: DC
  Administered 2023-08-14 – 2023-08-17 (×7): 10 mg via ORAL
  Filled 2023-08-14 (×7): qty 1

## 2023-08-14 MED ORDER — ACETAMINOPHEN 325 MG PO TABS: 650.0000 mg | ORAL_TABLET | Freq: Four times a day (QID) | ORAL | Status: DC | PRN

## 2023-08-14 MED ORDER — SODIUM CHLORIDE 0.9 % IV BOLUS
1000.0000 mL | Freq: Once | INTRAVENOUS | Status: AC
  Administered 2023-08-14: 1000 mL via INTRAVENOUS

## 2023-08-14 MED ORDER — TERAZOSIN HCL 5 MG PO CAPS
5.0000 mg | ORAL_CAPSULE | Freq: Every day | ORAL | Status: DC
  Administered 2023-08-14 – 2023-08-16 (×3): 5 mg via ORAL
  Filled 2023-08-14 (×4): qty 1

## 2023-08-14 MED ORDER — INSULIN ASPART 100 UNIT/ML IJ SOLN: 0.0000 [IU] | Freq: Every day | INTRAMUSCULAR | Status: DC

## 2023-08-14 MED ORDER — CARVEDILOL 12.5 MG PO TABS
25.0000 mg | ORAL_TABLET | Freq: Two times a day (BID) | ORAL | Status: DC
  Administered 2023-08-15 – 2023-08-17 (×5): 25 mg via ORAL
  Filled 2023-08-14 (×6): qty 2

## 2023-08-14 MED ORDER — DONEPEZIL HCL 5 MG PO TABS
10.0000 mg | ORAL_TABLET | Freq: Every evening | ORAL | Status: DC
  Administered 2023-08-14 – 2023-08-16 (×3): 10 mg via ORAL
  Filled 2023-08-14 (×4): qty 2

## 2023-08-14 MED ORDER — PROCHLORPERAZINE EDISYLATE 10 MG/2ML IJ SOLN
10.0000 mg | Freq: Four times a day (QID) | INTRAMUSCULAR | Status: DC | PRN
  Administered 2023-08-14: 10 mg via INTRAVENOUS
  Filled 2023-08-14: qty 2

## 2023-08-14 MED ORDER — ASPIRIN 81 MG PO TBEC
81.0000 mg | DELAYED_RELEASE_TABLET | Freq: Every day | ORAL | Status: DC
  Administered 2023-08-14 – 2023-08-17 (×4): 81 mg via ORAL
  Filled 2023-08-14 (×4): qty 1

## 2023-08-14 MED ORDER — AMLODIPINE BESYLATE 5 MG PO TABS
5.0000 mg | ORAL_TABLET | Freq: Every day | ORAL | Status: DC
  Administered 2023-08-14 – 2023-08-17 (×4): 5 mg via ORAL
  Filled 2023-08-14 (×4): qty 1

## 2023-08-14 MED ORDER — METOCLOPRAMIDE HCL 5 MG/ML IJ SOLN
5.0000 mg | Freq: Three times a day (TID) | INTRAMUSCULAR | Status: DC | PRN
  Administered 2023-08-14: 5 mg via INTRAVENOUS
  Filled 2023-08-14: qty 2

## 2023-08-14 MED ORDER — SODIUM CHLORIDE 0.9 % IV SOLN: INTRAVENOUS | Status: AC

## 2023-08-14 MED ORDER — INSULIN ASPART 100 UNIT/ML IJ SOLN
0.0000 [IU] | Freq: Three times a day (TID) | INTRAMUSCULAR | Status: DC
  Administered 2023-08-14 – 2023-08-15 (×2): 1 [IU] via SUBCUTANEOUS
  Administered 2023-08-16: 2 [IU] via SUBCUTANEOUS

## 2023-08-14 NOTE — ED Triage Notes (Signed)
 Pt arrives with wife who provides hx d/t pt having dementia. She reports that he has been having emesis with increasing frequency over the last two weeks. Reports approximately 7 episodes since dinner last night. Pt denies abd pain.
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
## 2023-08-14 NOTE — ED Provider Notes (Signed)
 [facility] EMERGENCY DEPARTMENT AT [facility] Provider Note   CSN: [id number] Arrival date & time: 08/14/23  9166     Patient presents with: Emesis   Terry Stevens is a 78 y.o. male.   Patient is a 78 year old male who presents to the emergency department with a chief complaint of nausea and vomiting which has been ongoing for approximate the past week.  Wife does provide the history given the patient's history of dementia.  She notes that he has had worsening nausea and vomiting since last night.  She notes that symptoms occur after eating.  She notes that he did drink some juice this morning and within 10 minutes vomited the juice back up.  Patient currently denies any abdominal pain.  Wife notes that he has continued to have good bowel movements and has been urinating at his baseline.  There is been no associated fever, chills, chest pain, shortness of breath.   Emesis      Prior to Admission medications   Medication Sig Start Date End Date Taking? Authorizing Provider  allopurinol  (ZYLOPRIM ) 100 MG tablet Take 1 tablet by mouth daily.    [provider]  amLODipine  (NORVASC ) 5 MG tablet Take 5 mg by mouth daily.    [provider]  aspirin  81 MG tablet Take 81 mg by mouth daily.    [provider]  atorvastatin  (LIPITOR) 20 MG tablet Take 20 mg by mouth daily at 6 PM.     [provider]  carvedilol  (COREG ) 25 MG tablet Take 25 mg by mouth 2 (two) times daily with a meal.    [provider]  donepezil  (ARICEPT ) 10 MG tablet Take 10 mg by mouth every evening.    [provider]  famotidine  (PEPCID ) 20 MG tablet Take 20 mg by mouth at bedtime.    [provider]  folic acid  (FOLVITE ) 1 MG tablet Take 1 tablet by mouth daily. 01/25/23   [provider]  LANTUS SOLOSTAR 100 UNIT/ML Solostar Pen Inject 20 Units into the skin 2 (two) times daily.    [provider]  memantine  (NAMENDA ) 10 MG  tablet Take 10 mg by mouth 2 (two) times daily.    [provider]  Omega-3 Fatty Acids (FISH OIL) 1000 MG CAPS Take 1,000 mg by mouth 2 (two) times daily.    [provider]  pantoprazole  (PROTONIX ) 40 MG tablet Take 1 tablet (40 mg total) by mouth 2 (two) times daily. Patient not taking: Reported on 04/13/2023 02/22/23 05/17/23  Maree Bracken D, DO  spironolactone (ALDACTONE) 25 MG tablet Take 1 tablet by mouth daily.    [provider]  terazosin  (HYTRIN ) 5 MG capsule Take 5 mg by mouth at bedtime.    [provider]    Allergies: Penicillins    Review of Systems  Gastrointestinal:  Positive for nausea and vomiting.  All other systems reviewed and are negative.   Updated Vital Signs BP (!) 149/77   Pulse 61   Temp 97.6 F (36.4 C) (Oral)   Resp 15   SpO2 94%   Physical Exam Vitals and nursing note reviewed.  Constitutional:      Appearance: Normal appearance.  HENT:     Head: Normocephalic and atraumatic.     Nose: Nose normal.     Mouth/Throat:     Mouth: Mucous membranes are moist.  Eyes:     Extraocular Movements: Extraocular movements intact.     Conjunctiva/sclera: Conjunctivae normal.  Pupils: Pupils are equal, round, and reactive to light.  Cardiovascular:     Rate and Rhythm: Normal rate and regular rhythm.     Pulses: Normal pulses.     Heart sounds: Normal heart sounds. No murmur heard.    No gallop.  Pulmonary:     Effort: Pulmonary effort is normal. No respiratory distress.     Breath sounds: Normal breath sounds. No stridor. No wheezing, rhonchi or rales.  Abdominal:     General: Abdomen is flat. Bowel sounds are normal. There is no distension.     Palpations: Abdomen is soft.     Tenderness: There is no abdominal tenderness. There is no guarding.  Musculoskeletal:        General: Normal range of motion.     Cervical back: Normal range of motion and neck supple.  Skin:    General: Skin is warm and dry.      Coloration: Skin is not jaundiced.     Findings: No bruising or rash.  Neurological:     General: No focal deficit present.     Mental Status: He is alert and oriented to person, place, and time. Mental status is at baseline.  Psychiatric:        Mood and Affect: Mood normal.        Behavior: Behavior normal.        Thought Content: Thought content normal.        Judgment: Judgment normal.     (all labs ordered are listed, but only abnormal results are displayed) Labs Reviewed  COMPREHENSIVE METABOLIC PANEL WITH GFR - Abnormal; Notable for the following components:      Result Value   BUN 31 (*)    Creatinine, Ser 2.68 (*)    Albumin 2.9 (*)    AST 14 (*)    GFR, Estimated 24 (*)    All other components within normal limits  CBC - Abnormal; Notable for the following components:   RBC 4.00 (*)    Hemoglobin 10.7 (*)    HCT 35.9 (*)    MCHC 29.8 (*)    All other components within normal limits  LIPASE, BLOOD  URINALYSIS, ROUTINE W REFLEX MICROSCOPIC  TROPONIN I (HIGH SENSITIVITY)    EKG: None  Radiology: No results found.   Procedures   Medications Ordered in the ED  sodium chloride  0.9 % bolus 1,000 mL (1,000 mLs Intravenous New Bag/Given 08/14/23 0942)  ondansetron  (ZOFRAN ) injection 4 mg (4 mg Intravenous Given 08/14/23 0941)                                    Medical Decision Making Amount and/or Complexity of Data Reviewed Labs: ordered. Radiology: ordered.  Risk Prescription drug management. Decision regarding hospitalization.   This patient presents to the ED for concern of nausea, vomiting, this involves an extensive number of treatment options, and is a complaint that carries with it a high risk of complications and morbidity.  The differential diagnosis includes food bolus, esophageal stricture, acute appendicitis, cholecystitis, bowel torsion, diverticulitis, pyelonephritis, kidney stone, mesenteric ischemia, pancreatitis   Co morbidities that  complicate the patient evaluation  GERD, CKD   Additional history obtained:  Additional history obtained from wife External records from outside source obtained and reviewed including medical records   Lab Tests:  I Ordered, and personally interpreted labs.  The pertinent results include: No leukocytosis, stable anemia, stable  creatinine, unremarkable electrolytes, normal liver function, negative lipase, negative troponin, unremarkable urinalysis   Imaging Studies ordered:  I ordered imaging studies including CT scan chest abdomen and pelvis I independently visualized and interpreted imaging which showed no acute surgical process, patchy right upper lobe opacity I agree with the radiologist interpretation   Cardiac Monitoring: / EKG:  The patient was maintained on a cardiac monitor.  I personally viewed and interpreted the cardiac monitored which showed an underlying rhythm of: Normal sinus rhythm, no ST/T wave changes, no ischemic changes, no STEMI   Consultations Obtained:  I requested consultation with the gastroenterology, Dr. Shaaron,  and discussed lab and imaging findings as well as pertinent plan - they recommend: Admission, n.p.o. after midnight, will consult   Problem List / ED Course / Critical interventions / Medication management  Patient is doing well at this time and is stable.  Discussed patient case with Dr. Shaaron with gastroenterology who did recommend admission to the hospital service, n.p.o. after midnight and he will evaluate the patient for possible endoscopy.  Patient has had otherwise stable blood work in the emergency department.  Anemia and creatinine are at baseline.  CT scan of the chest abdomen pelvis demonstrated no acute surgical process.  He does have a hiatal hernia.  Patient has had no associated cough, fevers and no leukocytosis.  Low suspicion for pneumonia at this point.  Have discussed patient case with Dr. Ricky with the hospitalist service who  has excepted for admission at this time. I ordered medication including Zofran , IV fluids for nausea, vomiting Reevaluation of the patient after these medicines showed that the patient improved I have reviewed the patients home medicines and have made adjustments as needed   Social Determinants of Health:  None   Test / Admission - Considered:  Admission       Final diagnoses:  None    ED Discharge Orders     None          Daralene Lonni JONETTA DEVONNA 08/14/23 1228    Suzette Pac, MD 08/14/23 [phone]

## 2023-08-15 DIAGNOSIS — M109 Gout, unspecified: Secondary | ICD-10-CM | POA: Diagnosis not present

## 2023-08-15 DIAGNOSIS — Z8719 Personal history of other diseases of the digestive system: Secondary | ICD-10-CM

## 2023-08-15 DIAGNOSIS — Z96652 Presence of left artificial knee joint: Secondary | ICD-10-CM | POA: Diagnosis not present

## 2023-08-15 DIAGNOSIS — Z79899 Other long term (current) drug therapy: Secondary | ICD-10-CM | POA: Diagnosis not present

## 2023-08-15 DIAGNOSIS — E785 Hyperlipidemia, unspecified: Secondary | ICD-10-CM | POA: Diagnosis not present

## 2023-08-15 DIAGNOSIS — Z7982 Long term (current) use of aspirin: Secondary | ICD-10-CM | POA: Diagnosis not present

## 2023-08-15 DIAGNOSIS — N184 Chronic kidney disease, stage 4 (severe): Secondary | ICD-10-CM | POA: Diagnosis not present

## 2023-08-15 DIAGNOSIS — Z961 Presence of intraocular lens: Secondary | ICD-10-CM | POA: Diagnosis not present

## 2023-08-15 DIAGNOSIS — E1143 Type 2 diabetes mellitus with diabetic autonomic (poly)neuropathy: Secondary | ICD-10-CM | POA: Diagnosis not present

## 2023-08-15 DIAGNOSIS — R1310 Dysphagia, unspecified: Secondary | ICD-10-CM | POA: Diagnosis not present

## 2023-08-15 DIAGNOSIS — K449 Diaphragmatic hernia without obstruction or gangrene: Secondary | ICD-10-CM | POA: Diagnosis not present

## 2023-08-15 DIAGNOSIS — R1312 Dysphagia, oropharyngeal phase: Secondary | ICD-10-CM | POA: Diagnosis present

## 2023-08-15 DIAGNOSIS — E1122 Type 2 diabetes mellitus with diabetic chronic kidney disease: Secondary | ICD-10-CM | POA: Diagnosis not present

## 2023-08-15 DIAGNOSIS — K2100 Gastro-esophageal reflux disease with esophagitis, without bleeding: Secondary | ICD-10-CM | POA: Diagnosis not present

## 2023-08-15 DIAGNOSIS — K222 Esophageal obstruction: Secondary | ICD-10-CM | POA: Diagnosis not present

## 2023-08-15 DIAGNOSIS — Z9842 Cataract extraction status, left eye: Secondary | ICD-10-CM | POA: Diagnosis not present

## 2023-08-15 DIAGNOSIS — G309 Alzheimer's disease, unspecified: Secondary | ICD-10-CM | POA: Diagnosis not present

## 2023-08-15 DIAGNOSIS — Z87891 Personal history of nicotine dependence: Secondary | ICD-10-CM | POA: Diagnosis not present

## 2023-08-15 DIAGNOSIS — K5730 Diverticulosis of large intestine without perforation or abscess without bleeding: Secondary | ICD-10-CM | POA: Diagnosis not present

## 2023-08-15 DIAGNOSIS — K3184 Gastroparesis: Secondary | ICD-10-CM | POA: Diagnosis not present

## 2023-08-15 DIAGNOSIS — N400 Enlarged prostate without lower urinary tract symptoms: Secondary | ICD-10-CM | POA: Diagnosis not present

## 2023-08-15 DIAGNOSIS — I129 Hypertensive chronic kidney disease with stage 1 through stage 4 chronic kidney disease, or unspecified chronic kidney disease: Secondary | ICD-10-CM | POA: Diagnosis not present

## 2023-08-15 DIAGNOSIS — Z9841 Cataract extraction status, right eye: Secondary | ICD-10-CM | POA: Diagnosis not present

## 2023-08-15 DIAGNOSIS — D631 Anemia in chronic kidney disease: Secondary | ICD-10-CM | POA: Diagnosis not present

## 2023-08-15 DIAGNOSIS — F0280 Dementia in other diseases classified elsewhere without behavioral disturbance: Secondary | ICD-10-CM | POA: Diagnosis not present

## 2023-08-15 DIAGNOSIS — R112 Nausea with vomiting, unspecified: Secondary | ICD-10-CM | POA: Diagnosis present

## 2023-08-15 LAB — GLUCOSE, CAPILLARY
Glucose-Capillary: 128 mg/dL — ABNORMAL HIGH (ref 70–99)
Glucose-Capillary: 102 mg/dL — ABNORMAL HIGH (ref 70–99)
Glucose-Capillary: 110 mg/dL — ABNORMAL HIGH (ref 70–99)
Glucose-Capillary: 155 mg/dL — ABNORMAL HIGH (ref 70–99)

## 2023-08-15 LAB — CBC
HCT: 32.7 % — ABNORMAL LOW (ref 39.0–52.0)
Hemoglobin: 9.7 g/dL — ABNORMAL LOW (ref 13.0–17.0)
MCH: 26.5 pg (ref 26.0–34.0)
MCHC: 29.7 g/dL — ABNORMAL LOW (ref 30.0–36.0)
MCV: 89.3 fL (ref 80.0–100.0)
Platelets: 173 K/uL (ref 150–400)
RBC: 3.66 MIL/uL — ABNORMAL LOW (ref 4.22–5.81)
RDW: 15 % (ref 11.5–15.5)
WBC: 8.4 K/uL (ref 4.0–10.5)
nRBC: 0 % (ref 0.0–0.2)

## 2023-08-15 LAB — BASIC METABOLIC PANEL WITH GFR
Anion gap: 7 (ref 5–15)
BUN: 27 mg/dL — ABNORMAL HIGH (ref 8–23)
CO2: 25 mmol/L (ref 22–32)
Calcium: 8.1 mg/dL — ABNORMAL LOW (ref 8.9–10.3)
Chloride: 105 mmol/L (ref 98–111)
Creatinine, Ser: 2.41 mg/dL — ABNORMAL HIGH (ref 0.61–1.24)
GFR, Estimated: 27 mL/min — ABNORMAL LOW (ref >60)
Glucose, Bld: 149 mg/dL — ABNORMAL HIGH (ref 70–99)
Potassium: 4.2 mmol/L (ref 3.5–5.1)
Sodium: 137 mmol/L (ref 135–145)

## 2023-08-15 MED ORDER — PANTOPRAZOLE SODIUM 40 MG IV SOLR
40.0000 mg | Freq: Two times a day (BID) | INTRAVENOUS | Status: DC
  Administered 2023-08-15 – 2023-08-17 (×5): 40 mg via INTRAVENOUS
  Filled 2023-08-15 (×5): qty 10

## 2023-08-15 NOTE — Progress Notes (Signed)
 Progress Note   Patient: Terry Stevens FMW:[id number] DOB: 09/01/1945 DOA: 08/14/2023     0 DOS: the patient was seen and examined on 08/15/2023   Brief hospital admission narrative: Rane Blitch is a 78 y.o. 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  Symptoms have been present for the last week or so as per wife reports at the night prior to admission he had experience over 7 episodes of nausea and vomiting difficulty in his ability to keep down medications and adequate nutrition/hydration.   No fever, no abdominal pain, no diarrhea, no chest pain or shortness of breath; no sick contacts.  No focal deficits.   Workup in the ED demonstrating lipase: 44, troponin 7, urinalysis without signs of acute UTI - RAR:TARd 6.1, hemoglobin 10.7 and platelet count 1 80K Comprehensive metabolic panel: Sodium 139, potassium 4.2, chloride 102, bicarb 26, BUN 31, creatinine 2.68, normal LFTs and GFR 24  Assessment and plan 1-intractable nausea/vomiting - With underlying history of diabetes concern for gastroparesis and also possible esophagitis with stricture - Continue PPI - Continue advancing diet as tolerated - GI service consulted for decision regarding endoscopic evaluation - As needed Reglan  will be provided - Speech therapy contacted to assess swallowing safety (given underlying history of dementia high risk for aspiration).  2-type 2 diabetes mellitus - Continue to follow CBGs fluctuation - Continue sliding scale insulin .  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 - Minimize nephrotoxic agents and avoid hypotension.  4-history of gout - Continue allopurinol .  5-hypertension - Continue treatment with Coreg  and Norvasc  -Follow vital signs.  6-History of BPH - Continue terazosin   7-hyperlipidemia - Continue statin.  8-history of dementia - Continue supportive care - Continue treatment with Aricept  and Namenda  - Continue constant re-orientation provided.   Subjective:  Afebrile, No chest pain and per wife reports at bedside no further vomiting events.  Physical Exam: Vitals:   08/14/23 2109 08/14/23 2140 08/15/23 0408 08/15/23 1254  BP: (!) 148/84 (!) 148/84 (!) 152/73 122/62  Pulse: 73  67 (!) 58  Resp: 18  18 16   Temp: 98.2 F (36.8 C)  98.5 F (36.9 C) 98.7 F (37.1 C)  TempSrc: Oral  Oral Oral  SpO2: 92%  93% 92%  Weight:   106.7 kg   Height:       General exam: Alert, awake, afebrile, no vomiting; no chest pain or shortness of breath. Respiratory system: Clear to auscultation. Respiratory effort normal.  Good saturation on room air. Cardiovascular system: No rubs, no gallops, no JVD. Gastrointestinal system: Abdomen is nondistended, soft and nontender. No organomegaly or masses felt. Normal bowel sounds heard. Central nervous system: No focal neurological deficits. Extremities: No cyanosis or clubbing. Skin: No petechiae. Psychiatry: Judgement and insight appear impaired secondary to underlying dementia.  Mood & affect appropriate.    Data Reviewed: CBC: WBCs 8.4, hemoglobin 9.7, platelet counts 173K Basic metabolic panel:Sodium 137, potassium 4.2, chloride 105, bicarb 25, BUN 27, creatinine 2.41 and GFR 27  Family Communication: wife at bedside.  Disposition: Status is: Inpatient Remains inpatient appropriate because: Continue IV therapy and further advance diet as tolerated; follow GI service recommendations.  Anticipate discharge back home once medically stable.  Time spent: 50  minutes  Author: Eric Nunnery, MD 08/15/2023 7:09 PM  For on call review [email or web address].

## 2023-08-15 NOTE — Consult Note (Signed)
 Gastroenterology Consult   Referring Provider: No ref. provider found Primary Care Physician:  Terry Stevens LABOR, Stevens Primary Gastroenterologist:  Dr.  Patient ID: Terry Stevens; 05/28/1945   Admit date: 08/14/2023  LOS: 0 days   Date of Consultation: 08/15/2023  Reason for Consultation:  Intractable nausea and vomiting   History of Present Illness   Terry Stevens is a 78 y.o. 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 GI now consulted. Last seen March 2025 as outpatient and doing well with plans to continue PPI therapy.  In the ED: Hgb 10.7, near baseline, lipase normal, creatinine 2.68, troponin negative, A1c 7.5. Negative US .  CT chest abd/pelvis without contrast: possible inflammatory or infectious left upper lope airspace disease, small hiatal hernia, diverticulosis, CAD.     Today: Wife at bedside. Provides majority of history. Patient alert to place and month but does not know the year. Notes he had been doing well on pantoprazole  once daily since March 2025 visit without vomiting. Was tolerating oral diet and pills. Good appetite. Prescription ran out and was unable to get refilled. States drug store said no refills and had come from the hospital. Was unable to call our office in time to check as had to go to the ED and our office was closed. He was off pantoprazole  for about 2 weeks. Once stopping this, his symptoms started.   No vomiting while on pantoprazole . Vomiting started when PPI was finished. Patient doesn't like to swallow pills. Coughs with liquids. Coughs with foods. Was eating well when on PPI. No rectal bleeding. Was complaining of abdominal pain in upper abdomen a few days ago.   Was  given liquids yesterday. Regurgitated this. Wasn't hungry for supper yesterday evening. Ate jello, apple juice and water  last night at 10pm. Took nausea medication. Denies nausea. Wife states when coughing, has lots of phlegm. Hungry this morning. He wants to eat.    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    Colonoscopy many years ago but patient unsure the date. No known polyps. No FH colon cancer or polyps.   Past Medical History:  Diagnosis Date   Arthritis    Constipation    Diabetes mellitus without complication (HCC)    GERD (gastroesophageal reflux disease)    tums prn   Gout    Hypertension    Osteoarthritis of left knee 02/27/2013    Past Surgical History:  Procedure Laterality Date   CATARACT EXTRACTION W/PHACO Right 06/13/2020   Procedure: CATARACT EXTRACTION PHACO AND INTRAOCULAR LENS PLACEMENT RIGHT EYE;  Surgeon: Terry Agent, Stevens;  Location: [facility];  Service: Ophthalmology;  Laterality: Right;  CDE  8.92   CATARACT EXTRACTION W/PHACO Left 06/27/2020   Procedure: CATARACT EXTRACTION PHACO AND INTRAOCULAR LENS PLACEMENT LEFT EYE;  Surgeon: Terry Agent, Stevens;  Location: [facility];  Service: Ophthalmology;  Laterality: Left;  left,  CDE=8.21   ESOPHAGOGASTRODUODENOSCOPY (EGD) WITH PROPOFOL  N/A 02/21/2023   Procedure: ESOPHAGOGASTRODUODENOSCOPY (EGD) WITH PROPOFOL ;  Surgeon: Terry Carlin POUR, DO;  Location: [facility];  Service: Endoscopy;  Laterality: N/A;   HOT HEMOSTASIS  02/21/2023   Procedure: HOT HEMOSTASIS (ARGON PLASMA  COAGULATION/BICAP);  Surgeon: Terry Carlin POUR, DO;  Location: [facility];  Service: Endoscopy;;   NO PAST SURGERIES     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 Authorizing Provider  furosemide (LASIX) 40 MG tablet Take 40 mg by mouth every morning. 07/25/23  Yes Provider, Historical, Stevens  sodium bicarbonate  650 MG tablet Take 650 mg by mouth 2 (two) times daily. 07/27/23  Yes Provider, Historical, Stevens  TRESIBA FLEXTOUCH 100 UNIT/ML FlexTouch Pen SMARTSIG:60 Unit(s) SUB-Q Daily 07/30/23  Yes Provider, Historical, Stevens  allopurinol  (ZYLOPRIM ) 100 MG tablet Take 1 tablet by mouth daily.    Provider, Historical, Stevens  amLODipine  (NORVASC ) 5 MG tablet Take 5 mg by mouth daily.    Provider, Historical, Stevens  aspirin  81 MG tablet Take 81 mg by mouth daily.    Provider, Historical, Stevens  atorvastatin  (LIPITOR) 20 MG tablet Take 20 mg by mouth daily at 6 PM.     Provider, Historical, Stevens  carvedilol  (COREG ) 25 MG tablet Take 25 mg by mouth 2 (two) times daily with a meal.    Provider, Historical, Stevens  donepezil  (ARICEPT ) 10 MG tablet Take 10 mg by mouth every evening.    Provider, Historical, Stevens  famotidine  (PEPCID ) 20 MG tablet Take 20 mg by mouth at bedtime.    Provider, Historical, Stevens  folic acid  (FOLVITE ) 1 MG tablet Take 1 tablet by mouth daily. 01/25/23   Provider, Historical, Stevens  LANTUS SOLOSTAR 100 UNIT/ML Solostar Pen Inject 20 Units into the skin 2 (two) times daily.    Provider, Historical, Stevens  memantine  (NAMENDA ) 10 MG tablet Take 10 mg by mouth 2 (two) times daily.    Provider, Historical, Stevens  Omega-3 Fatty Acids (FISH OIL) 1000 MG CAPS Take 1,000 mg by mouth 2 (two) times daily.    Provider, Historical, Stevens  pantoprazole  (PROTONIX ) 40 MG tablet Take 1 tablet (40 mg total) by mouth 2 (two) times daily. Patient not taking: Reported on 04/13/2023 02/22/23 05/17/23  Maree Bracken D, DO  spironolactone (ALDACTONE) 25 MG tablet Take 1 tablet by mouth daily.    Provider, Historical, Stevens  terazosin  (HYTRIN ) 5 MG capsule Take 5 mg by mouth at bedtime.    Provider, Historical, Stevens    Current Facility-Administered Medications   Medication Dose Route Frequency Provider Last Rate Last Admin   acetaminophen  (TYLENOL ) tablet 650 mg  650 mg Oral Q6H PRN Terry Fines, Stevens       Or   acetaminophen  (TYLENOL ) suppository 650 mg  650 mg Rectal Q6H PRN Terry Fines, Stevens       allopurinol  (ZYLOPRIM ) tablet 100 mg  100 mg Oral Daily Terry Fines, Stevens   100 mg at 08/14/23 1323   amLODipine  (NORVASC ) tablet 5 mg  5 mg Oral Daily Terry Fines, Stevens   5 mg at 08/14/23 1323   aspirin  EC tablet 81 mg  81 mg Oral Daily Terry Fines, Stevens   81 mg at 08/14/23 1323   atorvastatin  (LIPITOR) tablet 20 mg  20 mg Oral q1800 Terry Fines, Stevens       carvedilol  (COREG ) tablet 25 mg  25 mg Oral BID WC Terry Fines, Stevens   25 mg at 08/15/23 0848   donepezil  (ARICEPT ) tablet 10 mg  10 mg Oral QPM Madera,  Eric, Stevens   10 mg at 08/14/23 2140   famotidine  (PEPCID ) tablet 20 mg  20 mg Oral QHS Terry Eric, Stevens   20 mg at 08/14/23 2140   heparin  injection 5,000 Units  5,000 Units Subcutaneous Q8H Madera, Carlos, Stevens   5,000 Units at 08/15/23 0505   insulin  aspart (novoLOG ) injection 0-5 Units  0-5 Units Subcutaneous QHS Terry Eric, Stevens       insulin  aspart (novoLOG ) injection 0-9 Units  0-9 Units Subcutaneous TID WC Terry Eric, Stevens   1 Units at 08/15/23 0848   memantine  (NAMENDA ) tablet 10 mg  10 mg Oral BID Terry Eric, Stevens   10 mg at 08/14/23 2140   metoCLOPramide  (REGLAN ) injection 5 mg  5 mg Intravenous Q8H PRN Terry Eric, Stevens   5 mg at 08/14/23 2147   Oral care mouth rinse  15 mL Mouth Rinse PRN Terry Eric, Stevens       prochlorperazine  (COMPAZINE ) injection 10 mg  10 mg Intravenous Q6H PRN Terry Eric, Stevens   10 mg at 08/14/23 1756   terazosin  (HYTRIN ) capsule 5 mg  5 mg Oral QHS Terry Eric, Stevens   5 mg at 08/14/23 2140    Allergies as of 08/14/2023 - Review Complete 08/14/2023  Allergen Reaction Noted   Penicillins  04/13/2023    History reviewed. No pertinent family history.  Social History   Socioeconomic History    Marital status: Married    Spouse name: Avier Jech   Number of children: 1   Years of education: 12   Highest education level: 12th grade  Occupational History   Not on file  Tobacco Use   Smoking status: Former    Current packs/day: 0.00    Types: Cigarettes    Quit date: 06/06/1985    Years since quitting: 38.2    Passive exposure: Past   Smokeless tobacco: Never   Tobacco comments:    quit in late 1980's  Vaping Use   Vaping status: Never Used  Substance and Sexual Activity   Alcohol use: No   Drug use: No   Sexual activity: Yes    Partners: Female  Other Topics Concern   Not on file  Social History Narrative   Not on file   Social Drivers of Health   Financial Resource Strain: Low Risk  (03/13/2023)   Overall Financial Resource Strain (CARDIA)    Difficulty of Paying Living Expenses: Not hard at all  Food Insecurity: No Food Insecurity (08/14/2023)   Hunger Vital Sign    Worried About Running Out of Food in the Last Year: Never true    Ran Out of Food in the Last Year: Never true  Transportation Needs: No Transportation Needs (08/14/2023)   PRAPARE - Administrator, Civil Service (Medical): No    Lack of Transportation (Non-Medical): No  Physical Activity: Inactive (03/13/2023)   Exercise Vital Sign    Days of Exercise per Week: 0 days    Minutes of Exercise per Session: 0 min  Stress: No Stress Concern Present (03/13/2023)   [employer] of Occupational Health - Occupational Stress Questionnaire    Feeling of Stress : Not at all  Social Connections: Socially Integrated (08/14/2023)   Social Connection and Isolation Panel    Frequency of Communication with Friends and Family: More than three times a week    Frequency of Social Gatherings with Friends and Family: More than three times a week    Attends Religious Services: More  than 4 times per year    Active Member of Clubs or Organizations: Yes    Attends Banker Meetings: More than 4  times per year    Marital Status: Married  Catering manager Violence: Not At Risk (08/14/2023)   Humiliation, Afraid, Rape, and Kick questionnaire    Fear of Current or Ex-Partner: No    Emotionally Abused: No    Physically Abused: No    Sexually Abused: No     Review of Systems   Gen: see HPI CV: Denies chest pain, heart palpitations, syncope, edema  Resp: Denies shortness of breath with rest, cough, wheezing, coughing up blood, and pleurisy. GI: see HPI GU : Denies urinary burning, blood in urine, urinary frequency, and urinary incontinence. MS: Denies joint pain, limitation of movement, swelling, cramps, and atrophy.  Derm: Denies rash, itching, dry skin, hives. Psych: Denies depression, anxiety, memory loss, hallucinations, and confusion. Heme: Denies bruising or bleeding Neuro:  Denies any headaches, dizziness, paresthesias, shaking  Physical Exam   Vital Signs in last 24 hours: Temp:  [97.5 F (36.4 C)-98.5 F (36.9 C)] 98.5 F (36.9 C) (07/07 0408) Pulse Rate:  [58-73] 67 (07/07 0408) Resp:  [16-19] 18 (07/07 0408) BP: (126-152)/(62-84) 152/73 (07/07 0408) SpO2:  [91 %-94 %] 93 % (07/07 0408) Weight:  [106.7 kg-107.1 kg] 106.7 kg (07/07 0408)    General:   Alert,  Well-developed, well-nourished, pleasant and cooperative in NAD Head:  Normocephalic and atraumatic. Eyes:  Sclera clear, no icterus.    Ears:  Normal auditory acuity. Lungs:  Clear throughout to auscultation.  Heart:  S1 S2 without murmurs Abdomen:  Soft, nontender and nondistended. No masses, hepatosplenomegaly or hernias noted. Normal bowel sounds, without guarding, and without rebound.   Rectal: deferred   Msk:  Symmetrical without gross deformities. Normal posture. Extremities:  Without edema. Neurologic:  Alert and  oriented to person, place, does not know year.  Skin:  Intact without significant lesions or rashes. Psych:  Alert and cooperative. Normal mood and affect.  Intake/Output from  previous day: 07/06 0701 - 07/07 0700 In: 1854.3 [I.V.:854.3; IV Piggyback:1000] Out: 225 [Urine:225] Intake/Output this shift: No intake/output data recorded.   Labs/Studies   Recent Labs Recent Labs    08/14/23 0848 08/15/23 0514  WBC 6.1 8.4  HGB 10.7* 9.7*  HCT 35.9* 32.7*  PLT 180 173   BMET Recent Labs    08/14/23 0848 08/15/23 0514  NA 139 137  K 4.2 4.2  CL 102 105  CO2 26 25  GLUCOSE 76 149*  BUN 31* 27*  CREATININE 2.68* 2.41*  CALCIUM  8.9 8.1*   LFT Recent Labs    08/14/23 0848  PROT 7.3  ALBUMIN 2.9*  AST 14*  ALT 13  ALKPHOS 97  BILITOT 0.8     Radiology/Studies CT CHEST ABDOMEN PELVIS WO CONTRAST Result Date: 08/14/2023 CLINICAL DATA:  Dementia, increasing emesis for 2 weeks, abdominal pain, dysphagia EXAM: CT CHEST, ABDOMEN AND PELVIS WITHOUT CONTRAST TECHNIQUE: Multidetector CT imaging of the chest, abdomen and pelvis was performed following the standard protocol without IV contrast. RADIATION DOSE REDUCTION: This exam was performed according to the departmental dose-optimization program which includes automated exposure control, adjustment of the mA and/or kV according to patient size and/or use of iterative reconstruction technique. COMPARISON:  02/20/2023 FINDINGS: CT CHEST FINDINGS Cardiovascular: Unenhanced imaging of the heart is unremarkable without pericardial effusion. Normal caliber of the thoracic aorta. Atherosclerosis of the aorta and coronary vasculature. Assessment of the  vascular lumen cannot be performed without intravenous contrast. Mediastinum/Nodes: Calcified hilar and mediastinal lymph nodes consistent with previous granulomatous disease. No pathologic adenopathy. Trachea and esophagus appear unremarkable. There is a small hiatal hernia, unchanged since prior exam. Lungs/Pleura: Scattered areas of ground-glass airspace disease are seen within the left upper lobe, compatible with focal inflammatory or infectious etiology. Continued  bibasilar subpleural scarring or atelectasis. No effusion or pneumothorax. Central airways are patent. Musculoskeletal: No acute or destructive bony abnormalities. Reconstructed images demonstrate no additional findings. CT ABDOMEN PELVIS FINDINGS Hepatobiliary: Unremarkable unenhanced appearance of the liver and gallbladder. No biliary duct dilation. Pancreas: Unremarkable unenhanced appearance. Spleen: Unremarkable unenhanced appearance. Adrenals/Urinary Tract: No urinary tract calculi or obstructive uropathy within either kidney. Stable simple left peripelvic cyst does not require follow-up. The adrenals and bladder are unremarkable. Stomach/Bowel: No bowel obstruction or ileus. Normal appendix right lower quadrant. Diverticulosis of the distal colon without evidence of acute diverticulitis. No bowel wall thickening or inflammatory change. Small hiatal hernia. Vascular/Lymphatic: Aortic atherosclerosis. No enlarged abdominal or pelvic lymph nodes. Reproductive: Prostate is unremarkable. Other: No free fluid or free intraperitoneal gas. Small fat containing umbilical hernia. Musculoskeletal: No acute or destructive bony abnormalities. Reconstructed images demonstrate no additional findings. IMPRESSION: 1. Patchy left upper lobe ground-glass airspace disease which may be inflammatory or infectious. 2. Small hiatal hernia. 3. Distal colonic diverticulosis without diverticulitis. 4. Aortic Atherosclerosis (ICD10-I70.0). Coronary artery atherosclerosis. Electronically Signed   By: Ozell Daring M.D.   On: 08/14/2023 10:39     Assessment   Reynaldo Rossman is a 78 y.o. 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 GI now consulted. Last seen  March 2025 as outpatient and doing well with plans to continue PPI therapy.   Intractable nausea and vomiting: difficulty tolerating oral intake started about 2 weeks ago when he ran out of his PPI therapy. Wife notes he did well with oral intake without vomiting and had a good appetite while on this. Yesterday evening was able to tolerate jello and liquids. Will start IV PPI BID and monitor. As EGD in Jan 2025 showed Grade A esophagitis and medium sized hiatal hernia, recommend continuing on PPI as outpatient to avoid GERD exacerbations. Unable to rule out underlying delayed gastric emptying; however, he was doing well with PPI as outpatient. Would favor diet/behavior modifications and hold on GES.   Oropharyngeal dysphagia: noting coughing with liquids and some foods. No obvious esophageal component. Reaching out to speech for consult. High risk for aspiration.    Plan / Recommendations    Start pantoprazole  IV BID Speech consult for oropharyngeal dysphagia Advancing to full liquids Aspiration precautions discussed Further recommendations to follow     08/15/2023, 10:01 AM  Therisa MICAEL Stager, PhD, ANP-BC [facility] Gastroenterology

## 2023-08-15 NOTE — Care Management Obs Status (Signed)
 MEDICARE OBSERVATION STATUS NOTIFICATION   Patient Details  Name: Terry Stevens MRN: [id number] Date of Birth: 1945-03-12   Medicare Observation Status Notification Given:  Yes    Mcarthur Saddie Kim, LCSW 08/15/2023, 10:38 AM

## 2023-08-15 NOTE — Plan of Care (Signed)

## 2023-08-15 NOTE — Progress Notes (Signed)
 Transition of Care Department [facility]) has reviewed patient and no other TOC needs have been identified at this time. We will continue to monitor patient advancement through interdisciplinary progression rounds. If new patient transition needs arise, please place a TOC consult.   08/15/23 1020  TOC Brief Assessment  Insurance and Status Reviewed  Patient has primary care physician Yes  Home environment has been reviewed Lives with wife.  Prior level of function: Independent.  Prior/Current Home Services No current home services  Social Drivers of Health Review SDOH reviewed no interventions necessary  Readmission risk has been reviewed Yes  Transition of care needs no transition of care needs at this time

## 2023-08-16 ENCOUNTER — Telehealth: Payer: Self-pay | Admitting: Gastroenterology

## 2023-08-16 DIAGNOSIS — R1312 Dysphagia, oropharyngeal phase: Secondary | ICD-10-CM | POA: Diagnosis not present

## 2023-08-16 DIAGNOSIS — Z8719 Personal history of other diseases of the digestive system: Secondary | ICD-10-CM | POA: Diagnosis not present

## 2023-08-16 DIAGNOSIS — R112 Nausea with vomiting, unspecified: Secondary | ICD-10-CM | POA: Diagnosis not present

## 2023-08-16 LAB — BASIC METABOLIC PANEL WITH GFR
Anion gap: 9 (ref 5–15)
BUN: 23 mg/dL (ref 8–23)
CO2: 24 mmol/L (ref 22–32)
Calcium: 8.5 mg/dL — ABNORMAL LOW (ref 8.9–10.3)
Chloride: 106 mmol/L (ref 98–111)
Creatinine, Ser: 2.29 mg/dL — ABNORMAL HIGH (ref 0.61–1.24)
GFR, Estimated: 29 mL/min — ABNORMAL LOW (ref >60)
Glucose, Bld: 109 mg/dL — ABNORMAL HIGH (ref 70–99)
Potassium: 4.3 mmol/L (ref 3.5–5.1)
Sodium: 139 mmol/L (ref 135–145)

## 2023-08-16 LAB — GLUCOSE, CAPILLARY
Glucose-Capillary: 119 mg/dL — ABNORMAL HIGH (ref 70–99)
Glucose-Capillary: 182 mg/dL — ABNORMAL HIGH (ref 70–99)
Glucose-Capillary: 70 mg/dL (ref 70–99)
Glucose-Capillary: 128 mg/dL — ABNORMAL HIGH (ref 70–99)

## 2023-08-16 MED ORDER — ORAL CARE MOUTH RINSE
15.0000 mL | OROMUCOSAL | Status: DC | PRN
Start: 2023-08-16 — End: 2023-08-17

## 2023-08-16 NOTE — Progress Notes (Signed)
 Mobility Specialist Progress Note:    08/16/23 1523  Mobility  Activity Ambulated with assistance in room;Ambulated with assistance to bathroom  Level of Assistance Standby assist, set-up cues, supervision of patient - no hands on  Assistive Device None  Distance Ambulated (ft) 20 ft  Range of Motion/Exercises Active;All extremities  Activity Response Tolerated well  Mobility Referral Yes  Mobility visit 1 Mobility  Mobility Specialist Start Time (ACUTE ONLY) 1500  Mobility Specialist Stop Time (ACUTE ONLY) 1523  Mobility Specialist Time Calculation (min) (ACUTE ONLY) 23 min   Pt received attempting to ambulate w/o assistance. Required SBA to ambulate with no AD. Tolerated well, required verbal cues and re-orienting from RN. Returned supine, alarm on. All needs met.   Sherrilee Ditty Mobility Specialist Please contact via Special educational needs teacher or  Rehab office at [phone]

## 2023-08-16 NOTE — Telephone Encounter (Signed)
 Please arrange hospital follow-up with Terry Stevens or myself in 2-4 weeks.

## 2023-08-16 NOTE — Progress Notes (Signed)
    Subjective: Reports he is doing well. No nausea, vomiting. No abdominal pain. Has been tolerating full liquid diet well. No family at bedside. Spoke with nurse who confirms patient has tolerated full liquid diet well. No nausea or vomiting.   Spoke with speech therapist Waddell Novak, who states patient does have some oral dysphagia but is safe for a modified dysphagia diet. States his wife said his coughing with PO seemed limited to only the past 2 weeks without his PPI. Recommended D2 diet.   Objective: Vital signs in last 24 hours: Temp:  [98.4 F (36.9 C)-98.7 F (37.1 C)] (P) 98.4 F (36.9 C) (07/08 0419) Pulse Rate:  [54-60] 59 (07/08 0848) Resp:  [16] (P) 16 (07/08 0419) BP: (122-134)/(62-69) 134/69 (07/08 0848) SpO2:  [92 %-94 %] (P) 92 % (07/08 0419) Weight:  [106.2 kg] 106.2 kg (07/08 0419) Last BM Date : 08/14/23 General:   Alert and oriented, pleasant, NAD.  Head:  Normocephalic and atraumatic. Abdomen:  Bowel sounds present, soft, non-tender. No HSM or hernias noted. No rebound or guarding. No masses appreciated  Msk:  Symmetrical without gross deformities. Normal posture. Extremities:  Without edema. Psych:  Normal mood and affect.  Intake/Output from previous day: 07/07 0701 - 07/08 0700 In: 240 [P.O.:240] Out: -  Intake/Output this shift: No intake/output data recorded.  Lab Results: Recent Labs    08/14/23 0848 08/15/23 0514  WBC 6.1 8.4  HGB 10.7* 9.7*  HCT 35.9* 32.7*  PLT 180 173   BMET Recent Labs    08/14/23 0848 08/15/23 0514 08/16/23 0416  NA 139 137 139  K 4.2 4.2 4.3  CL 102 105 106  CO2 26 25 24   GLUCOSE 76 149* 109*  BUN 31* 27* 23  CREATININE 2.68* 2.41* 2.29*  CALCIUM  8.9 8.1* 8.5*   LFT Recent Labs    08/14/23 0848  PROT 7.3  ALBUMIN 2.9*  AST 14*  ALT 13  ALKPHOS 97  BILITOT 0.8    Assessment: 78 y.o. 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 GI now consulted. Last seen March 2025 as outpatient and doing well with plans to continue PPI therapy.   Intractable nausea and vomiting:  Likely secondary to uncontrolled GERD as he was doing fine prior to running out of PPI and has had resolution of symptoms with resuming PPI this admission. Last EGD Jan 2025 showed Grade A esophagitis and medium sized hiatal hernia. No need for repeat procedure. Recommend continuing PPI outpatient.    Oropharyngeal dysphagia:  Noting coughing with liquids and some foods. No obvious esophageal component. SLP has evaluated patient. Notes some oral dysphagia but is safe for a modified dysphagia diet. States his wife said his coughing with PO seemed limited to only the past 2 weeks without his PPI. Recommended D2 diet.      Plan: Advance to D2 diet.  Continue Protonix  40 mg BID.  If patient tolerates D2 diet well without recurrent nausea/vomiting, would be ok to discharge from GI standpoint.    LOS: 1 day    08/16/2023, 11:24 AM   Josette Centers, PA-C [facility] Gastroenterology

## 2023-08-16 NOTE — Progress Notes (Signed)
 Progress Note   Patient: Terry Stevens FMW:[id number] DOB: [birth year] DOA: 08/14/2023     1 DOS: the patient was seen and examined on 08/16/2023   Brief hospital admission narrative: Terry Stevens is a 78 y.o. 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  Symptoms have been present for the last week or so as per wife reports at the night prior to admission he had experience over 7 episodes of nausea and vomiting difficulty in his ability to keep down medications and adequate nutrition/hydration.   No fever, no abdominal pain, no diarrhea, no chest pain or shortness of breath; no sick contacts.  No focal deficits.   Workup in the ED demonstrating lipase: 44, troponin 7, urinalysis without signs of acute UTI - RAR:TARd 6.1, hemoglobin 10.7 and platelet count 1 80K Comprehensive metabolic panel: Sodium 139, potassium 4.2, chloride 102, bicarb 26, BUN 31, creatinine 2.68, normal LFTs and GFR 24  Assessment and plan 1-intractable nausea/vomiting - With underlying history of diabetes concern for gastroparesis and also possible esophagitis with stricture - Continue PPI treatment - Continue advancing diet as tolerated; planning to move to soft diet.  If tolerated will be able to go home in the morning. - GI service consulted for decision regarding endoscopic evaluation - Continue as needed Reglan  - Appreciate assistance and recommendation by speech therapy; dysphagia 2 with thin liquids has been ordered.  2-type 2 diabetes mellitus - Continue to follow CBGs fluctuation - Continue sliding scale insulin .  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 - Minimize nephrotoxic agents and avoid hypotension.  4-history of gout - Continue allopurinol .  5-hypertension - Continue treatment with Coreg  and Norvasc  -Follow vital signs.  6-History of BPH - Continue terazosin   7-hyperlipidemia - Continue statin.  8-history of dementia - Continue supportive care - Continue treatment with Aricept  and Namenda  - Continue constant re-orientation provided.   Subjective:  No overnight events; reports no nausea, no vomiting, no chest pain and no shortness of breath.  Tolerating full liquid diet without any problems.  Physical Exam: Vitals:   08/15/23 2152 08/16/23 0419 08/16/23 0848 08/16/23 1359  BP: 133/65 (!) (P) 125/57 134/69 102/62  Pulse:  (P) 60 (!) 59 (!) 57  Resp:  (P) 16  16  Temp:  (P) 98.4 F (36.9 C)  97.6 F (36.4 C)  TempSrc:  (P) Oral  Oral  SpO2:  (P) 92%  96%  Weight:  106.2 kg    Height:       General exam: Alert, awake, following commands appropriately and in no acute distress.  Patient reports no nausea or vomiting. Respiratory system: Good saturation on room air. Cardiovascular system: Rate rate controlled, no rubs, no gallops. Gastrointestinal system: Abdomen is nondistended, soft and nontender. No organomegaly or masses felt. Normal bowel sounds heard. Central nervous system: Moving 4 limbs spontaneously.  No focal neurological deficits. Extremities: No cyanosis, clubbing or edema. Skin: No petechiae. Psychiatry: Mood & affect appropriate.    Latest data Reviewed: CBC: WBCs 8.4, hemoglobin 9.7, platelet counts 173K Basic metabolic panel:Sodium 137, potassium 4.2, chloride 105, bicarb 25, BUN 27, creatinine 2.41 and GFR 27  Family Communication: wife at  bedside.  Disposition: Status is: Inpatient Remains inpatient appropriate because: Continue IV therapy and further advance diet as tolerated; follow GI service  recommendations.  Anticipate discharge back home once medically stable.  Time spent: 35 minutes  Author: Eric Nunnery, MD 08/16/2023 5:40 PM  For on call review [email or web address].

## 2023-08-16 NOTE — Plan of Care (Signed)
  Problem: Clinical Measurements: Goal: Ability to maintain clinical measurements within normal limits will improve Outcome: Progressing Goal: Will remain free from infection Outcome: Progressing Goal: Diagnostic test results will improve Outcome: Progressing   Problem: Activity: Goal: Risk for activity intolerance will decrease Outcome: Progressing   Problem: Pain Managment: Goal: General experience of comfort will improve and/or be controlled Outcome: Progressing   Problem: Safety: Goal: Ability to remain free from injury will improve Outcome: Progressing   Problem: Skin Integrity: Goal: Risk for impaired skin integrity will decrease Outcome: Progressing   Problem: Coping: Goal: Ability to adjust to condition or change in health will improve Outcome: Progressing   Problem: Skin Integrity: Goal: Risk for impaired skin integrity will decrease Outcome: Progressing

## 2023-08-16 NOTE — Progress Notes (Signed)
 Pt had an uneventful night. Did not experience any episodes of nausea and vomiting. Pt had periodic episodes of non-productive coughing. Pts wife continues to be at bedside providing support.

## 2023-08-16 NOTE — Evaluation (Signed)
 Clinical/Bedside Swallow Evaluation Patient Details  Name: Terry Stevens MRN: [id number] Date of Birth: 10-11-1945  Today's Date: 08/16/2023 Time: SLP Start Time (ACUTE ONLY): 0945 SLP Stop Time (ACUTE ONLY): 1011 SLP Time Calculation (min) (ACUTE ONLY): 26 min  Past Medical History:  Past Medical History:  Diagnosis Date   Arthritis    Constipation    Diabetes mellitus without complication (HCC)    GERD (gastroesophageal reflux disease)    tums prn   Gout    Hypertension    Osteoarthritis of left knee 02/27/2013   Past Surgical History:  Past Surgical History:  Procedure Laterality Date   CATARACT EXTRACTION W/PHACO Right 06/13/2020   Procedure: CATARACT EXTRACTION PHACO AND INTRAOCULAR LENS PLACEMENT RIGHT EYE;  Surgeon: Harrie Agent, MD;  Location: [facility];  Service: Ophthalmology;  Laterality: Right;  CDE  8.92   CATARACT EXTRACTION W/PHACO Left 06/27/2020   Procedure: CATARACT EXTRACTION PHACO AND INTRAOCULAR LENS PLACEMENT LEFT EYE;  Surgeon: Harrie Agent, MD;  Location: [facility];  Service: Ophthalmology;  Laterality: Left;  left,  CDE=8.21   ESOPHAGOGASTRODUODENOSCOPY (EGD) WITH PROPOFOL  N/A 02/21/2023   Procedure: ESOPHAGOGASTRODUODENOSCOPY (EGD) WITH PROPOFOL ;  Surgeon: Cindie Carlin POUR, DO;  Location: [facility];  Service: Endoscopy;  Laterality: N/A;   HOT HEMOSTASIS  02/21/2023   Procedure: HOT HEMOSTASIS (ARGON PLASMA COAGULATION/BICAP);  Surgeon: Cindie Carlin POUR, DO;  Location: [facility];  Service: Endoscopy;;   NO PAST SURGERIES     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 male admitted 08/14/23 due to intractable nausea and vomiting over the past week. PMHx significant for: dementia, gout/arthritis, hypertension, gastroesophageal reflux disease with esophagitis, constipation, and type 2 diabetes mellitus with  nephropathy. GI was consulted during acute stay, reports pt was tolerating PO until about 2 weeks ago when he ran out of PPI medications and they were unable to rule out underlying delayed gastric emptying. BSE requested for: Concern for oropharyngeal dysphagia. Notes coughing with liquids, foods. Hx of Alzeheimer's. EGD on file from jan 2025. Does not seem to have esophageal dysphagia.    CXR 08/14/23: 1. Patchy left upper lobe ground-glass airspace disease which may be  inflammatory or infectious. 2. Small hiatal hernia. 3. Distal colonic diverticulosis without diverticulitis. 4. Aortic Atherosclerosis (ICD10-I70.0). Coronary artery  atherosclerosis.   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    Assessment / Plan / Recommendation  Clinical Impression  Pt seen for beside swallow evaluation. Pt's wife present and providing hx. Pt denied pain and states he is feeling much better. Wife reports pt started coughing with PO intake approximately 2 weeks ago when he ran out of his PPI meds. Nausea/vomiting began approximately 1 week ago. Suspect coughing with PO was due to reflux into pharynx since this was not occurring when GERD was managed with meds. She also states he will hold pills in his mouth at home. Wife and RN state this is not happening during hospital stay. Baseline diet per wife's description is likely mech soft. Oral mech revealed top and bottom dentures with slightly reduced bilateral lingual strength.   Pt consumed ice chips via spoon, thin liquid water  via tsp/cup/straw, and regular texture peanut butter crackers. No overt s/sx of aspiration observed across consistencies. Pt completed 3  oz water  test without stopping or coughing, indicating he is likely at a low risk for aspiration. Prolonged mastication and mild lingual residue were observed with regular solids but  his swallow was timely and he did not exhibit oral holding or pocketing. Residue cleared with clinician administered liquid wash. Belching x1 noted, consistent with GERD dx. SLP provided education to pt's wife on dysphagia and dementia as well as diet texture recommendation; wife verbalized understanding.   Pt presents with mild oral phase dysphagia likely cognitive in nature given dementia dx. Deficits characterized by oral holding and prolonged mastication. Recommend Dys 2 with thin liquids and meds whole in thin (if pt begins exhibiting oral holding again, give meds crushed in puree). SLP informed GI of Dys 2 recommendation but did not change diet order as GI will be following up with him. Pt appears to be at baseline level of functioning; therefore, no further ST services warranted at this level of care.   SLP Visit Diagnosis: Dysphagia, oral phase (R13.11)    Aspiration Risk  Mild aspiration risk    Diet Recommendation Dysphagia 2 (Fine chop);Thin liquid    Liquid Administration via: Spoon;Cup;Straw Medication Administration: Other (Comment) (Pt safe for meds whole with liquid but if oral holding persists, crush meds and give in puree) Supervision: Intermittent supervision to cue for compensatory strategies;Patient able to self feed Compensations: Minimize environmental distractions;Slow rate;Small sips/bites;Follow solids with liquid Postural Changes: Seated upright at 90 degrees;Remain upright for at least 30 minutes after po intake    Other  Recommendations Oral Care Recommendations: Oral care BID     Assistance Recommended at Discharge  Supervision with meals  Functional Status Assessment Patient has had a recent decline in their functional status and demonstrates the ability to make significant improvements in function in a reasonable and predictable amount of time.  Frequency and Duration            Prognosis Prognosis for improved oropharyngeal function: Good Barriers to Reach  Goals: Cognitive deficits      Swallow Study   General Date of Onset:  (2 weeks ago) HPI: Pt is a 78 y.o. male admitted 08/14/23 due to intractable nausea and vomiting over the past week. PMHx significant for: dementia, gout/arthritis, hypertension, gastroesophageal reflux disease with esophagitis, constipation, and type 2 diabetes mellitus with nephropathy. GI was consulted during acute stay, reports pt was tolerating PO until about 2 weeks ago when he ran out of PPI medications and they were unable to rule out underlying delayed gastric emptying. BSE requested for: Concern for oropharyngeal dysphagia. Notes coughing with liquids, foods. Hx of Alzeheimer's. EGD on file from jan 2025. Does not seem to have esophageal dysphagia.  CXR 08/14/23: 1. Patchy left upper lobe ground-glass airspace disease which may be  inflammatory or infectious. 2. Small hiatal hernia. 3. Distal colonic diverticulosis without diverticulitis. 4. Aortic Atherosclerosis (ICD10-I70.0). Coronary artery  atherosclerosis. 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 Type of Study: Bedside Swallow Evaluation Diet Prior to this Study: Full liquid diet Temperature Spikes Noted: No Respiratory Status: Room air History of Recent Intubation: No Behavior/Cognition: Alert;Cooperative;Pleasant mood Oral Cavity Assessment: Within Functional Limits Oral Care Completed by SLP: No Oral Cavity - Dentition: Dentures, bottom;Dentures, top Vision: Functional for self-feeding Self-Feeding Abilities: Needs set up;Able to feed self Patient Positioning: Upright in bed Baseline Vocal Quality: Normal Volitional Cough: Strong Volitional Swallow: Able to elicit  Oral/Motor/Sensory Function Overall Oral Motor/Sensory Function: Mild impairment Facial ROM: Within Functional Limits Facial Symmetry: Within Functional  Limits Facial Strength: Within Functional Limits Facial Sensation: Within Functional Limits Lingual ROM: Within Functional Limits Lingual Symmetry: Within Functional Limits Lingual Strength: Reduced Velum: Within Functional Limits Mandible: Within Functional Limits   Ice Chips Ice chips: Within functional limits Presentation: Spoon   Thin Liquid Thin Liquid: Within functional limits Presentation: Cup;Straw;Spoon    Nectar Thick Nectar Thick Liquid: Not tested   Honey Thick Honey Thick Liquid: Not tested   Puree Puree: Not tested   Solid     Solid: Impaired Oral Phase Impairments: Impaired mastication Oral Phase Functional Implications: Impaired mastication;Other (comment) (wife reports oral holding with pills at home)      Waddell JONETTA Novak, MA CCC-SLP 08/16/2023,11:25 AM

## 2023-08-17 ENCOUNTER — Ambulatory Visit: Admitting: Gastroenterology

## 2023-08-17 DIAGNOSIS — R112 Nausea with vomiting, unspecified: Secondary | ICD-10-CM | POA: Diagnosis not present

## 2023-08-17 LAB — GLUCOSE, CAPILLARY
Glucose-Capillary: 118 mg/dL — ABNORMAL HIGH (ref 70–99)
Glucose-Capillary: 180 mg/dL — ABNORMAL HIGH (ref 70–99)

## 2023-08-17 MED ORDER — SODIUM BICARBONATE 650 MG PO TABS: 650.0000 mg | ORAL_TABLET | Freq: Two times a day (BID) | ORAL | 5 refills | Status: AC

## 2023-08-17 MED ORDER — PANTOPRAZOLE SODIUM 40 MG PO TBEC: 40.0000 mg | DELAYED_RELEASE_TABLET | Freq: Two times a day (BID) | ORAL | 5 refills | Status: DC

## 2023-08-17 MED ORDER — ASPIRIN 81 MG PO TABS: 81.0000 mg | ORAL_TABLET | Freq: Every day | ORAL | 5 refills | Status: DC

## 2023-08-17 MED ORDER — TRAZODONE HCL 100 MG PO TABS: 100.0000 mg | ORAL_TABLET | Freq: Every evening | ORAL | 5 refills | Status: DC | PRN

## 2023-08-17 MED ORDER — PANTOPRAZOLE SODIUM 40 MG PO TBEC: 40.0000 mg | DELAYED_RELEASE_TABLET | Freq: Every day | ORAL | 5 refills | Status: DC

## 2023-08-17 MED ORDER — ACETAMINOPHEN 325 MG PO TABS: 650.0000 mg | ORAL_TABLET | Freq: Four times a day (QID) | ORAL | Status: DC | PRN

## 2023-08-17 MED ORDER — METOCLOPRAMIDE HCL 5 MG PO TABS
5.0000 mg | ORAL_TABLET | Freq: Three times a day (TID) | ORAL | 2 refills | Status: DC | PRN
Start: 2023-08-17 — End: 2023-08-17

## 2023-08-17 MED ORDER — PANTOPRAZOLE SODIUM 40 MG PO TBEC: 40.0000 mg | DELAYED_RELEASE_TABLET | Freq: Two times a day (BID) | ORAL | 5 refills | Status: AC

## 2023-08-17 MED ORDER — METOCLOPRAMIDE HCL 5 MG PO TABS: 5.0000 mg | ORAL_TABLET | Freq: Three times a day (TID) | ORAL | 2 refills | Status: DC | PRN

## 2023-08-17 NOTE — Discharge Instructions (Signed)
 1)Avoid ibuprofen/Advil/Aleve/Motrin/Goody Powders/Naproxen/BC powders/Meloxicam/Diclofenac/Indomethacin and other Nonsteroidal anti-inflammatory medications as these will make you more likely to bleed and can cause stomach ulcers, can also cause Kidney problems.   2)Very Low-salt diet advised---Less than 2 gm of Sodium per day advised----ok to use Mrs DASH salt substitute instead of Salt  3)Follow-up Gastroenterologist Terry Stevens with [facility]--- in 2 to 4 weeks for Re-evaluation  -address: [address] [place], [place] [address], Phone: [phone]  4)- Dysphagia 2 diet-----soft/chopped diet advised

## 2023-08-17 NOTE — Discharge Summary (Signed)
 Terry Stevens, is a 78 y.o. male  DOB [birth year]  MRN [id number].  Admission date:  08/14/2023  Admitting Physician  Eric Nunnery, MD  Discharge Date:  08/17/2023   Primary MD  Orpha Yancey LABOR, MD  Recommendations for primary care physician for things to follow:  1)Avoid ibuprofen/Advil/Aleve/Motrin/Goody Powders/Naproxen/BC powders/Meloxicam/Diclofenac/Indomethacin and other Nonsteroidal anti-inflammatory medications as these will make you more likely to bleed and can cause stomach ulcers, can also cause Kidney problems.   2)Very Low-salt diet advised---Less than 2 gm of Sodium per day advised----ok to use Mrs DASH salt substitute instead of Salt  3)Follow-up Gastroenterologist Therisa Stager with [facility]--- in 2 to 4 weeks for Re-evaluation  -address: [address], [place], [place] [address], Phone: [phone]  4)- Dysphagia 2 diet-----soft/chopped diet advised  Admission Diagnosis  Intractable nausea and vomiting [R11.2] Dysphagia, unspecified type [R13.10] Nausea and vomiting, unspecified vomiting type [R11.2]   Discharge Diagnosis  Intractable nausea and vomiting [R11.2] Dysphagia, unspecified type [R13.10] Nausea and vomiting, unspecified vomiting type [R11.2]    Principal Problem:   Intractable nausea and vomiting Active Problems:   Oropharyngeal dysphagia      Past Medical History:  Diagnosis Date   Arthritis    Constipation    Diabetes mellitus without complication (HCC)    GERD (gastroesophageal reflux disease)    tums prn   Gout    Hypertension    Osteoarthritis of left knee 02/27/2013    Past Surgical History:  Procedure Laterality Date   CATARACT EXTRACTION W/PHACO Right 06/13/2020   Procedure: CATARACT EXTRACTION PHACO AND INTRAOCULAR LENS PLACEMENT RIGHT EYE;  Surgeon: Harrie Agent, MD;  Location: [facility];  Service: Ophthalmology;  Laterality: Right;   CDE  8.92   CATARACT EXTRACTION W/PHACO Left 06/27/2020   Procedure: CATARACT EXTRACTION PHACO AND INTRAOCULAR LENS PLACEMENT LEFT EYE;  Surgeon: Harrie Agent, MD;  Location: [facility];  Service: Ophthalmology;  Laterality: Left;  left,  CDE=8.21   ESOPHAGOGASTRODUODENOSCOPY (EGD) WITH PROPOFOL  N/A 02/21/2023   Procedure: ESOPHAGOGASTRODUODENOSCOPY (EGD) WITH PROPOFOL ;  Surgeon: Cindie Carlin POUR, DO;  Location: [facility];  Service: Endoscopy;  Laterality: N/A;   HOT HEMOSTASIS  02/21/2023   Procedure: HOT HEMOSTASIS (ARGON PLASMA COAGULATION/BICAP);  Surgeon: Cindie Carlin POUR, DO;  Location: [facility];  Service: Endoscopy;;   NO PAST SURGERIES     TOTAL KNEE ARTHROPLASTY Left 02/27/2013   Procedure: TOTAL KNEE ARTHROPLASTY;  Surgeon: Fonda SHAUNNA Olmsted, MD;  Location: [facility];  Service: Orthopedics;  Laterality: Left;     HPI  from the history and physical done on the day of admission:    HPI: Terry Stevens is a 78 y.o. 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  Symptoms have been present for the last week or so as per wife reports at the night prior to admission he had experience over  7 episodes of nausea and vomiting difficulty in his ability to keep down medications and adequate nutrition/hydration.   No fever, no abdominal pain, no diarrhea, no chest pain or shortness of breath; no sick contacts.  No focal deficits.   Workup in the ED demonstrating lipase: 44, troponin 7, urinalysis without signs of acute UTI - RAR:TARd 6.1, hemoglobin 10.7 and platelet count 1 80K Comprehensive metabolic panel: Sodium 139, potassium 4.2, chloride 102, bicarb 26, BUN 31, creatinine 2.68, normal LFTs and GFR 24   Review of Systems: As mentioned in  the history of present illness. All other systems reviewed and are negative.   Hospital Course:     1-intractable nausea/vomiting - With underlying history of diabetes concern for gastroparesis and also possible esophagitis with stricture -- GI consult appreciated  - GI team would like to defer endoscopic evaluation at this time  -Much improved on PPI and antiemetics - Appreciate assistance and recommendation by speech therapy; dysphagia 2 with thin liquids has been ordered. --Discharged on Reglan  and Protonix  -Outpatient follow-up with GI advised   2-type 2 diabetes mellitus - Okay to restart Tresiba.   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   5-hypertension - Continue Coreg  and Norvasc    6-History of BPH - Continue terazosin    7-hyperlipidemia - Continue statin.   8-history of dementia - Continue supportive care - Continue treatment with Aricept  and Namenda  - Continue constant re-orientation provided.   9) acute on chronic anemia--suspect related to underlying CKD stage IV -No obvious bleeding -Outpatient follow-up with GI for possible luminal evaluation as advised   Discharge Condition: stable  Follow UP   Follow-up Information     Shirlean Therisa ORN, NP. Schedule an appointment as soon as possible for a visit in 2 week(s).   Specialty: Gastroenterology Contact information: [address] [place] [address] [phone]                 Consults obtained - Gi  Diet and Activity recommendation:  As advised  Discharge Instructions   Discharge Instructions     Call MD for:  difficulty breathing, headache or visual disturbances   Complete by: As directed    Call MD for:  persistant dizziness or light-headedness   Complete by: As directed    Call MD for:  persistant  nausea and vomiting   Complete by: As directed    Call MD for:  temperature >100.4   Complete by: As directed    Diet - low sodium heart healthy   Complete by: As directed    - Dysphagia 2 diet-----soft/chopped diet advised   Discharge instructions   Complete by: As directed    1)Avoid ibuprofen/Advil/Aleve/Motrin/Goody Powders/Naproxen/BC powders/Meloxicam/Diclofenac/Indomethacin and other Nonsteroidal anti-inflammatory medications as these will make you more likely to bleed and can cause stomach ulcers, can also cause Kidney problems.   2)Very Low-salt diet advised---Less than 2 gm of Sodium per day advised----ok to use Mrs DASH salt substitute instead of Salt  3)Follow-up Gastroenterologist Therisa Shirlean with [facility]--- in 2 to 4 weeks for Re-evaluation  -address: [address] [place], [place] [address], Phone: [phone]  4)- Dysphagia 2 diet-----soft/chopped diet advised   Increase activity slowly   Complete by: As directed          Discharge Medications     Allergies  as of 08/17/2023       Reactions   Penicillins Other (See Comments)   Unsure of reaction per patient. Per pt's wife, not allergic        Medication List     PAUSE taking these medications    furosemide 40 MG tablet Wait to take this until: August 22, 2023 Commonly known as: LASIX Take 40 mg by mouth every morning.       STOP taking these medications    famotidine  20 MG tablet Commonly known as: PEPCID    spironolactone 25 MG tablet Commonly known as: ALDACTONE       TAKE these medications    acetaminophen  325 MG tablet Commonly known as: TYLENOL  Take 2 tablets (650 mg total) by mouth every 6 (six) hours as needed for mild pain (pain score 1-3) or fever (or Fever >/= 101).   allopurinol  100 MG tablet Commonly known as: ZYLOPRIM  Take 1 tablet by mouth daily.   amLODipine  5 MG tablet Commonly known as: NORVASC  Take 5 mg by mouth daily.   aspirin  81 MG  tablet Take 1 tablet (81 mg total) by mouth daily with breakfast. What changed: when to take this   atorvastatin  20 MG tablet Commonly known as: LIPITOR Take 20 mg by mouth daily at 6 PM.   carvedilol  25 MG tablet Commonly known as: COREG  Take 25 mg by mouth 2 (two) times daily with a meal.   donepezil  10 MG tablet Commonly known as: ARICEPT  Take 10 mg by mouth every evening.   Fish Oil 1000 MG Caps Take 1,000 mg by mouth 2 (two) times daily.   folic acid  1 MG tablet Commonly known as: FOLVITE  Take 1 tablet by mouth daily.   memantine  10 MG tablet Commonly known as: NAMENDA  Take 10 mg by mouth 2 (two) times daily.   metoCLOPramide  5 MG tablet Commonly known as: Reglan  Take 1 tablet (5 mg total) by mouth every 8 (eight) hours as needed for nausea or vomiting.   pantoprazole  40 MG tablet Commonly known as: Protonix  Take 1 tablet (40 mg total) by mouth 2 (two) times daily.   sodium bicarbonate  650 MG tablet Take 1 tablet (650 mg total) by mouth 2 (two) times daily.   terazosin  5 MG capsule Commonly known as: HYTRIN  Take 5 mg by mouth at bedtime.   traZODone  100 MG tablet Commonly known as: DESYREL  Take 1 tablet (100 mg total) by mouth at bedtime as needed for sleep (Insomnia).   Tresiba FlexTouch 100 UNIT/ML FlexTouch Pen Generic drug: insulin  degludec Inject 30 Units into the skin 2 (two) times daily.        Major procedures and Radiology Reports - PLEASE review detailed and final reports for all details, in brief -   CT CHEST ABDOMEN PELVIS WO CONTRAST Result Date: 08/14/2023 CLINICAL DATA:  Dementia, increasing emesis for 2 weeks, abdominal pain, dysphagia EXAM: CT CHEST, ABDOMEN AND PELVIS WITHOUT CONTRAST TECHNIQUE: Multidetector CT imaging of the chest, abdomen and pelvis was performed following the standard protocol without IV contrast. RADIATION DOSE REDUCTION: This exam was performed according to the departmental dose-optimization program which includes  automated exposure control, adjustment of the mA and/or kV according to patient size and/or use of iterative reconstruction technique. COMPARISON:  02/20/2023 FINDINGS: CT CHEST FINDINGS Cardiovascular: Unenhanced imaging of the heart is unremarkable without pericardial effusion. Normal caliber of the thoracic aorta. Atherosclerosis of the aorta and coronary vasculature. Assessment of the vascular lumen cannot be performed without intravenous contrast. Mediastinum/Nodes:  Calcified hilar and mediastinal lymph nodes consistent with previous granulomatous disease. No pathologic adenopathy. Trachea and esophagus appear unremarkable. There is a small hiatal hernia, unchanged since prior exam. Lungs/Pleura: Scattered areas of ground-glass airspace disease are seen within the left upper lobe, compatible with focal inflammatory or infectious etiology. Continued bibasilar subpleural scarring or atelectasis. No effusion or pneumothorax. Central airways are patent. Musculoskeletal: No acute or destructive bony abnormalities. Reconstructed images demonstrate no additional findings. CT ABDOMEN PELVIS FINDINGS Hepatobiliary: Unremarkable unenhanced appearance of the liver and gallbladder. No biliary duct dilation. Pancreas: Unremarkable unenhanced appearance. Spleen: Unremarkable unenhanced appearance. Adrenals/Urinary Tract: No urinary tract calculi or obstructive uropathy within either kidney. Stable simple left peripelvic cyst does not require follow-up. The adrenals and bladder are unremarkable. Stomach/Bowel: No bowel obstruction or ileus. Normal appendix right lower quadrant. Diverticulosis of the distal colon without evidence of acute diverticulitis. No bowel wall thickening or inflammatory change. Small hiatal hernia. Vascular/Lymphatic: Aortic atherosclerosis. No enlarged abdominal or pelvic lymph nodes. Reproductive: Prostate is unremarkable. Other: No free fluid or free intraperitoneal gas. Small fat containing  umbilical hernia. Musculoskeletal: No acute or destructive bony abnormalities. Reconstructed images demonstrate no additional findings. IMPRESSION: 1. Patchy left upper lobe ground-glass airspace disease which may be inflammatory or infectious. 2. Small hiatal hernia. 3. Distal colonic diverticulosis without diverticulitis. 4. Aortic Atherosclerosis (ICD10-I70.0). Coronary artery atherosclerosis. Electronically Signed   By: Ozell Daring M.D.   On: 08/14/2023 10:39   Today   Subjective    Terry Stevens today has no new concerns  No fever  Or chills  -- Wife at bedside, questions answered -Eating and drinking well, no nausea, no vomiting -Had BM - Abdominal pain, -Ambulatory and cooperative  Patient has been seen and examined prior to discharge   Objective   Blood pressure (!) 149/94, pulse 65, temperature 98.1 F (36.7 C), temperature source Oral, resp. rate 16, height 5' 7 (1.702 m), weight 105.6 kg, SpO2 100%.   Intake/Output Summary (Last 24 hours) at 08/17/2023 1222 Last data filed at 08/17/2023 0900 Gross per 24 hour  Intake 480 ml  Output --  Net 480 ml    Exam Gen:- Awake Alert, no acute distress  HEENT:- [place].AT, No sclera icterus Neck-Supple Neck,No JVD,.  Lungs-  CTAB , good air movement bilaterally CV- S1, S2 normal, regular Abd-  +ve B.Sounds, Abd Soft, No tenderness,    Extremity/Skin:- No  edema,   good pulses Psych-affect is appropriate, oriented x3 Neuro-no new focal deficits, no tremors    Data Review   CBC w Diff:  Lab Results  Component Value Date   WBC 8.4 08/15/2023   HGB 9.7 (L) 08/15/2023   HCT 32.7 (L) 08/15/2023   PLT 173 08/15/2023   LYMPHOPCT 32 03/21/2023   MONOPCT 8 03/21/2023   EOSPCT 3 03/21/2023   BASOPCT 0 03/21/2023    CMP:  Lab Results  Component Value Date   NA 139 08/16/2023   K 4.3 08/16/2023   CL 106 08/16/2023   CO2 24 08/16/2023   BUN 23 08/16/2023   CREATININE 2.29 (H) 08/16/2023   PROT 7.3 08/14/2023   ALBUMIN  2.9 (L) 08/14/2023   BILITOT 0.8 08/14/2023   ALKPHOS 97 08/14/2023   AST 14 (L) 08/14/2023   ALT 13 08/14/2023  .  Total Discharge time is about 33 minutes  Rendall Carwin M.D on 08/17/2023 at 12:22 PM  Go to [email or web address] -  for contact info  [facility] - Office  [phone]

## 2023-08-18 ENCOUNTER — Telehealth: Payer: Self-pay

## 2023-08-18 NOTE — Transitions of Care (Post Inpatient/ED Visit) (Signed)
   08/18/2023  Name: Terry Stevens MRN: [id number] DOB: 01/14/1946  Today's TOC FU Call Status: Today's TOC FU Call Status:: Unsuccessful Call (1st Attempt) Unsuccessful Call (1st Attempt) Date: 08/18/23  Attempted to reach the patient regarding the most recent Inpatient/ED visit.  Follow Up Plan: Additional outreach attempts will be made to reach the patient to complete the Transitions of Care (Post Inpatient/ED visit) call.   Sadey Yandell J. Nomi Rudnicki RN, MSN [facility], [facility] RN Care Manager Direct Dial: [phone]  Fax: [phone] Website: [email or web address]

## 2023-08-19 ENCOUNTER — Telehealth: Payer: Self-pay

## 2023-08-19 NOTE — Transitions of Care (Post Inpatient/ED Visit) (Signed)
   08/19/2023  Name: Terry Stevens MRN: [id number] DOB: 03-Mar-1945  Today's TOC FU Call Status: Today's TOC FU Call Status:: Unsuccessful Call (2nd Attempt) Unsuccessful Call (2nd Attempt) Date: 08/19/23  Attempted to reach the patient regarding the most recent Inpatient/ED visit.  Follow Up Plan: Additional outreach attempts will be made to reach the patient to complete the Transitions of Care (Post Inpatient/ED visit) call.   Reanna Scoggin J. Pier Laux RN, MSN [facility], [facility] RN Care Manager Direct Dial: [phone]  Fax: [phone] Website: [email or web address]

## 2023-08-22 ENCOUNTER — Telehealth: Payer: Self-pay

## 2023-08-22 NOTE — Transitions of Care (Post Inpatient/ED Visit) (Signed)
   08/22/2023  Name: Terry Stevens MRN: [id number] DOB: 08/05/1945  Today's TOC FU Call Status: Today's TOC FU Call Status:: Unsuccessful Call (3rd Attempt) Unsuccessful Call (3rd Attempt) Date: 08/22/23  Attempted to reach the patient regarding the most recent Inpatient/ED visit.  Follow Up Plan: No further outreach attempts will be made at this time. We have been unable to contact the patient.  Doyal Saric J. Rayhana Slider RN, MSN [facility], [facility] RN Care Manager Direct Dial: [phone]  Fax: [phone] Website: [email or web address]

## 2023-09-12 NOTE — Progress Notes (Unsigned)
 Referring Provider: Orpha Yancey LABOR, MD Primary Care Physician:  Orpha Yancey LABOR, MD Primary GI Physician: Dr. Cindie  Chief Complaint  Patient presents with   Follow-up    Hospital follow up. Started coughing a couple days ago.     HPI:   Terry Stevens is a 78 y.o. 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  Patient was recently admitted to the hospital 08/14/23-08/17/23 after presenting with coughing, multiple episodes of vomiting, inability to tolerate oral food or medications.  It was noted that patient had been doing just fine without issues of nausea/vomiting until he ran out of his PPI.  Once PPI was resumed, nausea and vomiting resolved.  Regarding dysphagia, symptoms were oropharyngeal, no obvious esophageal component.  SLP evaluated patient and noted some oral dysphagia, but safe for modified diet.  He was started on D2 diet.  Today:  Presents with his spouse today who provides essentially all of the history.  Last 1.5 weeks has had increased coughing. Coughs all day. Has increased phlegm production. No yellow or green color. Can be with or without eating. No nausea or vomiting.   No abdominal pain. Bowels moving well, but hasn't had BM in the last couple day. Appetite has been down the last couple of days. Hasn't had fever. For the last week, has had worsening issues with mental status as well. Not recognizing himself in the mirror, doesn't seem himself. More agitated.   No brbpr or melena.    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    Colonoscopy many years ago but patient unsure the date. No known polyps. No FH colon cancer or polyps.   Past Medical History:  Diagnosis Date    Arthritis    Constipation    Diabetes mellitus without complication (HCC)    GERD (gastroesophageal reflux disease)    tums prn   Gout    Hypertension    Osteoarthritis of left knee 02/27/2013    Past Surgical History:  Procedure Laterality Date   CATARACT EXTRACTION W/PHACO Right 06/13/2020   Procedure: CATARACT EXTRACTION PHACO AND INTRAOCULAR LENS PLACEMENT RIGHT EYE;  Surgeon: Harrie Agent, MD;  Location: [facility];  Service: Ophthalmology;  Laterality: Right;  CDE  8.92   CATARACT EXTRACTION W/PHACO Left 06/27/2020   Procedure: CATARACT EXTRACTION PHACO AND INTRAOCULAR LENS PLACEMENT LEFT EYE;  Surgeon: Harrie Agent, MD;  Location: [facility];  Service: Ophthalmology;  Laterality: Left;  left,  CDE=8.21   ESOPHAGOGASTRODUODENOSCOPY (EGD) WITH PROPOFOL  N/A 02/21/2023   Procedure: ESOPHAGOGASTRODUODENOSCOPY (EGD) WITH PROPOFOL ;  Surgeon: Cindie Carlin POUR, DO;  Location: [facility];  Service: Endoscopy;  Laterality: N/A;   HOT HEMOSTASIS  02/21/2023   Procedure: HOT HEMOSTASIS (ARGON PLASMA COAGULATION/BICAP);  Surgeon: Cindie Carlin POUR, DO;  Location: [facility];  Service: Endoscopy;;   NO PAST SURGERIES     TOTAL KNEE ARTHROPLASTY Left 02/27/2013   Procedure: TOTAL KNEE ARTHROPLASTY;  Surgeon: Fonda SHAUNNA Olmsted, MD;  Location: [facility];  Service: Orthopedics;  Laterality: Left;    Current Outpatient Medications  Medication Sig Dispense Refill   acetaminophen  (TYLENOL ) 325 MG tablet Take 2 tablets (650 mg total) by mouth every 6 (six) hours as needed for mild pain (pain score 1-3) or fever (  or Fever >/= 101).     allopurinol  (ZYLOPRIM ) 100 MG tablet Take 1 tablet by mouth daily.     amLODipine  (NORVASC ) 5 MG tablet Take 5 mg by mouth daily.     aspirin  81 MG tablet Take 1 tablet (81 mg total) by mouth daily with breakfast. 30 tablet 5   atorvastatin  (LIPITOR) 20 MG tablet Take 20 mg by mouth daily at 6 PM.      carvedilol  (COREG ) 25 MG tablet Take 25 mg by mouth 2 (two) times daily with  a meal.     donepezil  (ARICEPT ) 10 MG tablet Take 10 mg by mouth every evening.     folic acid  (FOLVITE ) 1 MG tablet Take 1 tablet by mouth daily.     furosemide  (LASIX ) 40 MG tablet Take 40 mg by mouth every morning.     memantine  (NAMENDA ) 10 MG tablet Take 10 mg by mouth 2 (two) times daily.     Omega-3 Fatty Acids (FISH OIL ) 1000 MG CAPS Take 1,000 mg by mouth 2 (two) times daily.     pantoprazole  (PROTONIX ) 40 MG tablet Take 1 tablet (40 mg total) by mouth 2 (two) times daily. 60 tablet 5   sodium bicarbonate  650 MG tablet Take 1 tablet (650 mg total) by mouth 2 (two) times daily. 60 tablet 5   terazosin  (HYTRIN ) 5 MG capsule Take 5 mg by mouth at bedtime.     traZODone  (DESYREL ) 100 MG tablet Take 1 tablet (100 mg total) by mouth at bedtime as needed for sleep (Insomnia). 30 tablet 5   TRESIBA FLEXTOUCH 100 UNIT/ML FlexTouch Pen Inject 30 Units into the skin 2 (two) times daily.     metoCLOPramide  (REGLAN ) 5 MG tablet Take 1 tablet (5 mg total) by mouth every 8 (eight) hours as needed for nausea or vomiting. (Patient not taking: Reported on 09/14/2023) 30 tablet 2   No current facility-administered medications for this visit.    Allergies as of 09/14/2023 - Review Complete 09/14/2023  Allergen Reaction Noted   Penicillins Other (See Comments) 04/13/2023    History reviewed. No pertinent family history.  Social History   Socioeconomic History   Marital status: Married    Spouse name: Tobie Hellen   Number of children: 1   Years of education: 12   Highest education level: 12th grade  Occupational History   Not on file  Tobacco Use   Smoking status: Former    Current packs/day: 0.00    Types: Cigarettes    Quit date: 06/06/1985    Years since quitting: 38.2    Passive exposure: Past   Smokeless tobacco: Never   Tobacco comments:    quit in late 1980's  Vaping Use   Vaping status: Never Used  Substance and Sexual Activity   Alcohol use: No   Drug use: No   Sexual  activity: Yes    Partners: Female  Other Topics Concern   Not on file  Social History Narrative   Not on file   Social Drivers of Health   Financial Resource Strain: Low Risk  (03/13/2023)   Overall Financial Resource Strain (CARDIA)    Difficulty of Paying Living Expenses: Not hard at all  Food Insecurity: No Food Insecurity (08/14/2023)   Hunger Vital Sign    Worried About Running Out of Food in the Last Year: Never true    Ran Out of Food in the Last Year: Never true  Transportation Needs: No Transportation Needs (08/14/2023)   PRAPARE -  Administrator, Civil Service (Medical): No    Lack of Transportation (Non-Medical): No  Physical Activity: Inactive (03/13/2023)   Exercise Vital Sign    Days of Exercise per Week: 0 days    Minutes of Exercise per Session: 0 min  Stress: No Stress Concern Present (03/13/2023)   [employer] of Occupational Health - Occupational Stress Questionnaire    Feeling of Stress : Not at all  Social Connections: Socially Integrated (08/14/2023)   Social Connection and Isolation Panel    Frequency of Communication with Friends and Family: More than three times a week    Frequency of Social Gatherings with Friends and Family: More than three times a week    Attends Religious Services: More than 4 times per year    Active Member of [employer] or Organizations: Yes    Attends Engineer, structural: More than 4 times per year    Marital Status: Married    Review of Systems: Gen: Denies fever, chills, cold or flulike symptoms, presyncope, syncope. CV: Denies chest pain. Resp: Denies shortness of breath.  GI: See HPI Heme: See HPI  Physical Exam: BP 133/70 (BP Location: Right Arm, Patient Position: Sitting, Cuff Size: Large)   Pulse 62   Temp 97.9 F (36.6 C) (Temporal)   Ht 5' 7 (1.702 m)   Wt 247 lb 9.6 oz (112.3 kg)   BMI 38.78 kg/m  General:   Alert and oriented. No distress noted. Pleasant and cooperative.  Head:  Normocephalic  and atraumatic. Eyes:  Conjuctiva clear without scleral icterus. Heart:  S1, S2 present without murmurs appreciated. Lungs:  Faint crackles appreciated in the bases. No wheezing, rales, rhonchi.  Normal respiratory effort. Abdomen:  +BS, soft, non-tender and non-distended. No rebound or guarding. No HSM or masses noted. Msk:  Symmetrical without gross deformities. Normal posture. Extremities:  With 2-3+ bilateral LE edema. Neurologic:  Alert.  Psych:  Normal mood and affect.    Assessment:  78 y.o. 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   Dysphagia:  Oropharyngeal dysphagia.  Seems to be doing okay with D2 diet.  He was evaluated by SLP during recent hospitalization in July and it was felt that his oral phase dysphagia was likely cognitive in nature driven by dementia.  He had oral holding and prolonged mastication.  No obvious esophageal component. Recommended D2 diet.  Notably, recent EGD in January 2025 with grade a reflux esophagitis, medium-sized hiatal hernia.   Nausea/Vomiting:  Likely secondary to GERD.  Resolved with resuming PPI twice daily.  Constipation: Mild.  Recommended starting MiraLAX  daily as patient has had a bowel movement a couple of days.  Can use MiraLAX  as needed or continue daily or even increase to twice daily if needed. No brbpr or melena.   Cough: Reports increased cough for the last 1.5 weeks.  Cough occurring all day long, not specifically related to meals.  Query whether he has developed mild pneumonia.  I do appreciate a few crackles in his lung bases bilaterally.  He has follow-up with PCP tomorrow and will defer further evaluation to PCP.   Urinary frequency: Increased frequency recently. Patient will discuss with PCP tomorrow.  Mental status  change: History of Alzheimer's, but wife reporting worsening mental status over the last week or so.  Patient  now recognizing himself in the mirror, seems to be more agitated.  Query whether he has underlying infection influencing the symptoms as he also reports some increased cough and urinary frequency recently.  He has follow-up with PCP tomorrow.  Encouraged wife to discuss her concerns with PCP for further evaluation.     Plan:  Continue pantoprazole  40 mg twice daily. Continue soft diet with chopped meats. Start MiraLAX  17 g daily.  Can use this as needed or on a daily basis if needed.  Would be okay to take twice a day if needed. Follow-up with PCP for further evaluation of increased cough, urinary frequency, mental status change. Follow-up in our office in 3 months or sooner if needed.   Josette Centers, PA-C [facility] 09/14/2023

## 2023-09-14 ENCOUNTER — Ambulatory Visit (INDEPENDENT_AMBULATORY_CARE_PROVIDER_SITE_OTHER): Admitting: Gastroenterology

## 2023-09-14 ENCOUNTER — Encounter: Payer: Self-pay | Admitting: Gastroenterology

## 2023-09-14 VITALS — BP 133/70 | HR 62 | Temp 97.9°F | Ht 67.0 in | Wt 247.6 lb

## 2023-09-14 DIAGNOSIS — R1312 Dysphagia, oropharyngeal phase: Secondary | ICD-10-CM | POA: Diagnosis not present

## 2023-09-14 DIAGNOSIS — R4182 Altered mental status, unspecified: Secondary | ICD-10-CM | POA: Diagnosis not present

## 2023-09-14 DIAGNOSIS — R350 Frequency of micturition: Secondary | ICD-10-CM

## 2023-09-14 DIAGNOSIS — R059 Cough, unspecified: Secondary | ICD-10-CM

## 2023-09-14 DIAGNOSIS — K5900 Constipation, unspecified: Secondary | ICD-10-CM | POA: Diagnosis not present

## 2023-09-14 DIAGNOSIS — K2100 Gastro-esophageal reflux disease with esophagitis, without bleeding: Secondary | ICD-10-CM | POA: Diagnosis not present

## 2023-09-14 NOTE — Patient Instructions (Signed)
 Continue pantoprazole  40 mg twice daily, 30 minutes before breakfast and 30 minutes before dinner.  Continue to follow a soft diet and chopped all meats.  To help get your bowels moving again, start MiraLAX  1 capful (17 g) daily in 8 ounces of water  or other noncarbonated beverage of your choice.  You can increase this to twice a day if needed.  Follow-up with your primary care doctor on increased cough for the last 1.5 weeks, increased urinary frequency, and change in mental status/worsening of dementia.  You may need chest x-ray to rule out development of mild pneumonia and possibly urine analysis to rule out infection, but I will leave workup/management to your primary care doctor.  We will follow-up with you in 3 months or sooner if needed.   Josette Centers, PA-C [facility]

## 2023-09-15 ENCOUNTER — Other Ambulatory Visit: Payer: Self-pay

## 2023-09-15 ENCOUNTER — Encounter (HOSPITAL_COMMUNITY): Payer: Self-pay

## 2023-09-15 ENCOUNTER — Inpatient Hospital Stay (HOSPITAL_COMMUNITY)
Admission: EM | Admit: 2023-09-15 | Discharge: 2023-09-18 | DRG: 291 | Disposition: A | Discharge status: Home / Self Care | Attending: Internal Medicine | Admitting: Internal Medicine

## 2023-09-15 ENCOUNTER — Emergency Department (HOSPITAL_COMMUNITY)

## 2023-09-15 DIAGNOSIS — I130 Hypertensive heart and chronic kidney disease with heart failure and stage 1 through stage 4 chronic kidney disease, or unspecified chronic kidney disease: Principal | ICD-10-CM | POA: Diagnosis present

## 2023-09-15 DIAGNOSIS — I5032 Chronic diastolic (congestive) heart failure: Secondary | ICD-10-CM | POA: Insufficient documentation

## 2023-09-15 DIAGNOSIS — N184 Chronic kidney disease, stage 4 (severe): Secondary | ICD-10-CM | POA: Diagnosis not present

## 2023-09-15 DIAGNOSIS — R059 Cough, unspecified: Secondary | ICD-10-CM | POA: Diagnosis not present

## 2023-09-15 DIAGNOSIS — J181 Lobar pneumonia, unspecified organism: Secondary | ICD-10-CM | POA: Diagnosis present

## 2023-09-15 DIAGNOSIS — I509 Heart failure, unspecified: Secondary | ICD-10-CM | POA: Diagnosis not present

## 2023-09-15 DIAGNOSIS — J690 Pneumonitis due to inhalation of food and vomit: Secondary | ICD-10-CM | POA: Diagnosis not present

## 2023-09-15 DIAGNOSIS — I517 Cardiomegaly: Secondary | ICD-10-CM | POA: Diagnosis not present

## 2023-09-15 DIAGNOSIS — Z880 Allergy status to penicillin: Secondary | ICD-10-CM

## 2023-09-15 DIAGNOSIS — Z7982 Long term (current) use of aspirin: Secondary | ICD-10-CM

## 2023-09-15 DIAGNOSIS — G9341 Metabolic encephalopathy: Secondary | ICD-10-CM | POA: Diagnosis present

## 2023-09-15 DIAGNOSIS — R1310 Dysphagia, unspecified: Secondary | ICD-10-CM | POA: Diagnosis present

## 2023-09-15 DIAGNOSIS — E782 Mixed hyperlipidemia: Secondary | ICD-10-CM | POA: Diagnosis present

## 2023-09-15 DIAGNOSIS — F0390 Unspecified dementia without behavioral disturbance: Secondary | ICD-10-CM | POA: Diagnosis present

## 2023-09-15 DIAGNOSIS — Z6838 Body mass index (BMI) 38.0-38.9, adult: Secondary | ICD-10-CM

## 2023-09-15 DIAGNOSIS — I700 Atherosclerosis of aorta: Secondary | ICD-10-CM | POA: Diagnosis not present

## 2023-09-15 DIAGNOSIS — K219 Gastro-esophageal reflux disease without esophagitis: Secondary | ICD-10-CM | POA: Diagnosis present

## 2023-09-15 DIAGNOSIS — J168 Pneumonia due to other specified infectious organisms: Secondary | ICD-10-CM | POA: Diagnosis not present

## 2023-09-15 DIAGNOSIS — I5031 Acute diastolic (congestive) heart failure: Secondary | ICD-10-CM | POA: Diagnosis present

## 2023-09-15 DIAGNOSIS — G9340 Encephalopathy, unspecified: Secondary | ICD-10-CM | POA: Diagnosis not present

## 2023-09-15 DIAGNOSIS — M1990 Unspecified osteoarthritis, unspecified site: Secondary | ICD-10-CM | POA: Diagnosis present

## 2023-09-15 DIAGNOSIS — E1122 Type 2 diabetes mellitus with diabetic chronic kidney disease: Secondary | ICD-10-CM | POA: Diagnosis present

## 2023-09-15 DIAGNOSIS — Z96652 Presence of left artificial knee joint: Secondary | ICD-10-CM | POA: Diagnosis present

## 2023-09-15 DIAGNOSIS — Z794 Long term (current) use of insulin: Secondary | ICD-10-CM

## 2023-09-15 DIAGNOSIS — Z79899 Other long term (current) drug therapy: Secondary | ICD-10-CM

## 2023-09-15 DIAGNOSIS — R918 Other nonspecific abnormal finding of lung field: Secondary | ICD-10-CM | POA: Diagnosis not present

## 2023-09-15 DIAGNOSIS — J189 Pneumonia, unspecified organism: Principal | ICD-10-CM

## 2023-09-15 DIAGNOSIS — Z87891 Personal history of nicotine dependence: Secondary | ICD-10-CM

## 2023-09-15 DIAGNOSIS — E66812 Obesity, class 2: Secondary | ICD-10-CM | POA: Diagnosis present

## 2023-09-15 HISTORY — DX: Unspecified dementia, unspecified severity, without behavioral disturbance, psychotic disturbance, mood disturbance, and anxiety: F03.90

## 2023-09-15 LAB — COMPREHENSIVE METABOLIC PANEL WITH GFR
ALT: 13 U/L (ref 0–44)
AST: 15 U/L (ref 15–41)
Albumin: 2.9 g/dL — ABNORMAL LOW (ref 3.5–5.0)
Alkaline Phosphatase: 104 U/L (ref 38–126)
Anion gap: 12 (ref 5–15)
BUN: 30 mg/dL — ABNORMAL HIGH (ref 8–23)
CO2: 24 mmol/L (ref 22–32)
Calcium: 8.7 mg/dL — ABNORMAL LOW (ref 8.9–10.3)
Chloride: 102 mmol/L (ref 98–111)
Creatinine, Ser: 2.62 mg/dL — ABNORMAL HIGH (ref 0.61–1.24)
GFR, Estimated: 25 mL/min — ABNORMAL LOW (ref >60)
Glucose, Bld: 110 mg/dL — ABNORMAL HIGH (ref 70–99)
Potassium: 4.3 mmol/L (ref 3.5–5.1)
Sodium: 138 mmol/L (ref 135–145)
Total Bilirubin: 1 mg/dL (ref 0.0–1.2)
Total Protein: 7.5 g/dL (ref 6.5–8.1)

## 2023-09-15 LAB — CBC WITH DIFFERENTIAL/PLATELET
Abs Immature Granulocytes: 0.02 K/uL (ref 0.00–0.07)
Basophils Absolute: 0 K/uL (ref 0.0–0.1)
Basophils Relative: 0 %
Eosinophils Absolute: 0.2 K/uL (ref 0.0–0.5)
Eosinophils Relative: 3 %
HCT: 35.9 % — ABNORMAL LOW (ref 39.0–52.0)
Hemoglobin: 11.1 g/dL — ABNORMAL LOW (ref 13.0–17.0)
Immature Granulocytes: 0 %
Lymphocytes Relative: 24 %
Lymphs Abs: 1.6 K/uL (ref 0.7–4.0)
MCH: 27.2 pg (ref 26.0–34.0)
MCHC: 30.9 g/dL (ref 30.0–36.0)
MCV: 88 fL (ref 80.0–100.0)
Monocytes Absolute: 0.5 K/uL (ref 0.1–1.0)
Monocytes Relative: 8 %
Neutro Abs: 4.3 K/uL (ref 1.7–7.7)
Neutrophils Relative %: 65 %
Platelets: 160 K/uL (ref 150–400)
RBC: 4.08 MIL/uL — ABNORMAL LOW (ref 4.22–5.81)
RDW: 15.4 % (ref 11.5–15.5)
WBC: 6.6 K/uL (ref 4.0–10.5)
nRBC: 0 % (ref 0.0–0.2)

## 2023-09-15 LAB — RESPIRATORY PANEL BY PCR

## 2023-09-15 LAB — RESP PANEL BY RT-PCR (RSV, FLU A&B, COVID)  RVPGX2
Influenza A by PCR: NEGATIVE
Influenza B by PCR: NEGATIVE
Resp Syncytial Virus by PCR: NEGATIVE
SARS Coronavirus 2 by RT PCR: NEGATIVE

## 2023-09-15 LAB — URINALYSIS, ROUTINE W REFLEX MICROSCOPIC
Bilirubin Urine: NEGATIVE
Glucose, UA: NEGATIVE mg/dL
Hgb urine dipstick: NEGATIVE
Ketones, ur: NEGATIVE mg/dL
Leukocytes,Ua: NEGATIVE
Nitrite: NEGATIVE
Protein, ur: NEGATIVE mg/dL
Specific Gravity, Urine: 1.013 (ref 1.005–1.030)
pH: 6 (ref 5.0–8.0)

## 2023-09-15 LAB — BRAIN NATRIURETIC PEPTIDE: B Natriuretic Peptide: 59 pg/mL (ref 0.0–100.0)

## 2023-09-15 MED ORDER — SODIUM CHLORIDE 0.9 % IV BOLUS
500.0000 mL | Freq: Once | INTRAVENOUS | Status: AC
  Administered 2023-09-15: 500 mL via INTRAVENOUS

## 2023-09-15 MED ORDER — FUROSEMIDE 10 MG/ML IJ SOLN
40.0000 mg | Freq: Once | INTRAMUSCULAR | Status: AC
  Administered 2023-09-15: 40 mg via INTRAVENOUS
  Filled 2023-09-15: qty 4

## 2023-09-15 MED ORDER — SODIUM CHLORIDE 0.9 % IV SOLN
1.0000 g | Freq: Once | INTRAVENOUS | Status: AC
  Administered 2023-09-15: 1 g via INTRAVENOUS
  Filled 2023-09-15: qty 10

## 2023-09-15 MED ORDER — AZITHROMYCIN 500 MG IV SOLR
500.0000 mg | INTRAVENOUS | Status: DC
  Administered 2023-09-15 – 2023-09-17 (×3): 500 mg via INTRAVENOUS
  Filled 2023-09-15 (×3): qty 5

## 2023-09-15 NOTE — Hospital Course (Addendum)
 78 year old male with a history of dementia, cardiac arrest, hypertension, diabetes mellitus type 2, CKD stage IV, GERD, and dysphagia presenting with 2-week history of altered mental status.  Wife also states that he has had increasing cough, chest congestion, and generalized weakness.  He has not had any fevers, chills, headache, neck pain, nausea, vomiting, diarrhea.  She states that he has been increasingly agitated over the past 1 to 2 weeks.  He has had some dyspnea on exertion.  There is not been any chest pain or abdominal pain or hemoptysis.  Notably, the patient had a recent hospital admission from 08/14/2023 to 08/17/2023 due to intractable nausea and vomiting.  He was felt that there was a combination of his gastroparesis, GERD, and hiatal hernia.  At the time of discharge, the patient was instructed to stop his daily furosemide  dosing until 08/22/2023 which the patient's spouse states she has resumed.  The patient spouse also relates that he continues to have some intermittent difficulty with swallowing.  She also notes that he has had increasing lower extremity edema and increasing abdominal girth over the past month.  In the ED, the patient was afebrile and hemodynamically stable with oxygen  saturation 92-96% on room air.  WBC 6.6, hemoglobin 11.1, platelets 160.  Sodium 138, potassium 4.3, bicarbonate 24, serum creatinine 2.62.  LFTs were unremarkable.  Chest x-ray showed bilateral interstitial opacities.  The patient was started on ceftriaxone  and azithromycin .

## 2023-09-15 NOTE — H&P (Addendum)
 History and Physical    Patient: Terry Stevens FMW:[id number] DOB: August 13, 1945 DOA: 09/15/2023 DOS: the patient was seen and examined on 09/15/2023 PCP: Orpha Yancey LABOR, MD  Patient coming from: Home  Chief Complaint:  Chief Complaint  Patient presents with   Cough   HPI: Terry Stevens is a 78 year old male with a history of dementia, cardiac arrest, hypertension, diabetes mellitus type 2, CKD stage IV, GERD, and dysphagia presenting with 2-week history of altered mental status.  Wife also states that he has had increasing cough, chest congestion, and generalized weakness.  He has not had any fevers, chills, headache, neck pain, nausea, vomiting, diarrhea.  She states that he has been increasingly agitated over the past 1 to 2 weeks.  He has had some dyspnea on exertion.  There is not been any chest pain or abdominal pain or hemoptysis.  Notably, the patient had a recent hospital admission from 08/14/2023 to 08/17/2023 due to intractable nausea and vomiting.  He was felt that there was a combination of his gastroparesis, GERD, and hiatal hernia.  At the time of discharge, the patient was instructed to stop his daily furosemide  dosing until 08/22/2023 which the patient's spouse states she has resumed.  The patient spouse also relates that he continues to have some intermittent difficulty with swallowing.  She also notes that he has had increasing lower extremity edema and increasing abdominal girth over the past month.  In the ED, the patient was afebrile and hemodynamically stable with oxygen  saturation 92-96% on room air.  WBC 6.6, hemoglobin 11.1, platelets 160.  Sodium 138, potassium 4.3, bicarbonate 24, serum creatinine 2.62.  LFTs were unremarkable.  Chest x-ray showed bilateral interstitial opacities.  The patient was started on ceftriaxone  and azithromycin .  Review of Systems: Unable to review all systems due to lack of cooperation from patient. Past Medical History:  Diagnosis Date   Arthritis     Constipation    Dementia (HCC)    Diabetes mellitus without complication (HCC)    GERD (gastroesophageal reflux disease)    tums prn   Gout    Hypertension    Osteoarthritis of left knee 02/27/2013   Past Surgical History:  Procedure Laterality Date   CATARACT EXTRACTION W/PHACO Right 06/13/2020   Procedure: CATARACT EXTRACTION PHACO AND INTRAOCULAR LENS PLACEMENT RIGHT EYE;  Surgeon: Harrie Agent, MD;  Location: [facility];  Service: Ophthalmology;  Laterality: Right;  CDE  8.92   CATARACT EXTRACTION W/PHACO Left 06/27/2020   Procedure: CATARACT EXTRACTION PHACO AND INTRAOCULAR LENS PLACEMENT LEFT EYE;  Surgeon: Harrie Agent, MD;  Location: [facility];  Service: Ophthalmology;  Laterality: Left;  left,  CDE=8.21   ESOPHAGOGASTRODUODENOSCOPY (EGD) WITH PROPOFOL  N/A 02/21/2023   Procedure: ESOPHAGOGASTRODUODENOSCOPY (EGD) WITH PROPOFOL ;  Surgeon: Cindie Carlin POUR, DO;  [facility];  Service: Endoscopy;  Laterality: N/A;   HOT HEMOSTASIS  02/21/2023   Procedure: HOT HEMOSTASIS (ARGON PLASMA COAGULATION/BICAP);  Surgeon: Cindie Carlin POUR, DO;  [facility]: [facility];  Service: Endoscopy;;   NO PAST SURGERIES     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 His smoking use included cigarettes. He has been exposed to tobacco smoke. He has never used smokeless tobacco. He reports that he does not drink alcohol and does not use drugs.  Allergies  Allergen Reactions  Penicillins Other (See Comments)    Unsure of reaction per patient. Per pt's wife, not allergic    History reviewed. No pertinent family history.  Prior to Admission medications   Medication Sig Start Date End Date Taking? Authorizing Provider  acetaminophen  (TYLENOL ) 325 MG tablet Take 2 tablets (650 mg total) by mouth every  6 (six) hours as needed for mild pain (pain score 1-3) or fever (or Fever >/= 101). 08/17/23   Emokpae, Courage, MD  allopurinol  (ZYLOPRIM ) 100 MG tablet Take 1 tablet by mouth daily.    [provider]  amLODipine  (NORVASC ) 5 MG tablet Take 5 mg by mouth daily.    [provider]  aspirin  81 MG tablet Take 1 tablet (81 mg total) by mouth daily with breakfast. 08/17/23   Pearlean, Courage, MD  atorvastatin  (LIPITOR) 20 MG tablet Take 20 mg by mouth daily at 6 PM.     [provider]  carvedilol  (COREG ) 25 MG tablet Take 25 mg by mouth 2 (two) times daily with a meal.    [provider]  donepezil  (ARICEPT ) 10 MG tablet Take 10 mg by mouth every evening.    [provider]  famotidine  (PEPCID ) 20 MG tablet Take 20 mg by mouth at bedtime. 08/24/23   [provider]  folic acid  (FOLVITE ) 1 MG tablet Take 1 tablet by mouth daily. 01/25/23   [provider]  furosemide  (LASIX ) 40 MG tablet Take 40 mg by mouth every morning. 07/25/23   [provider]  memantine  (NAMENDA ) 10 MG tablet Take 10 mg by mouth 2 (two) times daily.    [provider]  metoCLOPramide  (REGLAN ) 5 MG tablet Take 1 tablet (5 mg total) by mouth every 8 (eight) hours as needed for nausea or vomiting. Patient not taking: Reported on 09/14/2023 08/17/23 08/16/24  Pearlean Manus, MD  Omega-3 Fatty Acids (FISH OIL ) 1000 MG CAPS Take 1,000 mg by mouth 2 (two) times daily.    [provider]  pantoprazole  (PROTONIX ) 40 MG tablet Take 1 tablet (40 mg total) by mouth 2 (two) times daily. 08/17/23   Pearlean Manus, MD  sodium bicarbonate  650 MG tablet Take 1 tablet (650 mg total) by mouth 2 (two) times daily. 08/17/23   Pearlean Manus, MD  spironolactone (ALDACTONE) 25 MG tablet Take 25 mg by mouth daily. 08/24/23   [provider]  terazosin  (HYTRIN ) 5 MG capsule Take 5 mg by mouth at bedtime.    [provider]  traZODone  (DESYREL ) 100 MG tablet  Take 1 tablet (100 mg total) by mouth at bedtime as needed for sleep (Insomnia). 08/17/23   Pearlean Manus, MD  TRESIBA FLEXTOUCH 100 UNIT/ML FlexTouch Pen Inject 30 Units into the skin 2 (two) times daily. 07/30/23   [provider]    Physical Exam: Vitals:   09/15/23 1300 09/15/23 1315 09/15/23 1317 09/15/23 1400  BP: 132/71   136/75  Pulse:  (!) 57 60 60  Resp:      Temp:      TempSrc:      SpO2:  91% 96% 99%  Weight:      Height:       GENERAL:  A&O x 2, NAD, well developed, cooperative, follows commands HEENT: [place]/AT, No thrush, No icterus, No oral ulcers Neck:  No neck mass, No meningismus, soft, supple CV: RRR, no S3, no S4, no rub, no JVD Lungs: Bilateral crackles.  No wheezing.  Good air movement Ext: 2 + LE edema, no  lymphangitis, no cyanosis, no rashes Neuro:  CN II-XII intact, strength 4/5 in RUE, RLE, strength 4/5 LUE, LLE; sensation intact bilateral; no dysmetria; babinski equivocal  Data Reviewed: Data reviewed above in history Assessment and Plan: Acute CHF, type unspecified -work up in progress -start IV lasix  -daily weights -accurate I/Os  Aspiration pneumonia -speech therapy eval -continue ceftriaxone  and azithro for now - Check viral respiratory panel  Acute metabolic encephalopathy - At baseline, the patient is &O x 2, conversant, but needs some assistance with ADLs - Currently, patient is somnolent - Secondary to ADH and infectious processF - UA negative for pyuria  Diabetes mellitus type 2 with hypoglycemia - Spouse states that she has only been giving long-acting insulin  at home 3-4 times a week because of frequent lows - 08/14/2023 hemoglobin A1c 7.5 - NovoLog  sliding scale - Holding Semglee for now  CKD stage IV - Baseline creatinine 2.3-2.6  Essential hypertension - Continue carvedilol  at lower dose - Holding amlodipine  to allow BP margin for diuresis  Mixed hyperlipidemia - Continue statin  Major neurocognitive disorder -  Continue Aricept  and Namenda   GERD - Continue pantoprazole   Class 2 obesity -BMI 38.78 -lifestyle modification   Advance Care Planning: FULL  Consults: none  Family Communication: wife 8/7  Severity of Illness: The appropriate patient status for this patient is OBSERVATION. Observation status is judged to be reasonable and necessary in order to provide the required intensity of service to ensure the patient's safety. The patient's presenting symptoms, physical exam findings, and initial radiographic and laboratory data in the context of their medical condition is felt to place them at decreased risk for further clinical deterioration. Furthermore, it is anticipated that the patient will be medically stable for discharge from the hospital within 2 midnights of admission.   Author: Alm Schneider, MD 09/15/2023 2:46 PM  For on call review [email or web address].

## 2023-09-15 NOTE — ED Triage Notes (Signed)
 Pt arrived via POV from home c/o persistent cough, Sob and bilateral lower extremity swelling for over past week.

## 2023-09-15 NOTE — ED Notes (Signed)
 ED Provider at bedside.

## 2023-09-15 NOTE — ED Provider Notes (Signed)
 [place] [facility]PARTMENT AT [facility] Provider Note   CSN: [id number] Arrival date & time: 09/15/23  1006     Patient presents with: Cough   Terry Stevens is a 78 y.o. male.  He has history of dementia, CKD, HTN, diabetes, GERD, oropharyngeal dysphagia on modified diet.  Presents to the ER for evaluation of for the past week and a half.  This is been constant.  He had recent hospitalization 1 month ago for intractable nausea vomiting with dysphagia felt to be due to having stopped his PPI, symptoms much improved after restarting PPI.  He followed back up with GI yesterday was noting cough for the past week and a half both with and without eating and some increased confusion over the past week.   HPI     Prior to Admission medications   Medication Sig Start Date End Date Taking? Authorizing Provider  acetaminophen  (TYLENOL ) 325 MG tablet Take 2 tablets (650 mg total) by mouth every 6 (six) hours as needed for mild pain (pain score 1-3) or fever (or Fever >/= 101). 08/17/23   Emokpae, Courage, MD  allopurinol  (ZYLOPRIM ) 100 MG tablet Take 1 tablet by mouth daily.    [provider]  amLODipine  (NORVASC ) 5 MG tablet Take 5 mg by mouth daily.    [provider]  aspirin  81 MG tablet Take 1 tablet (81 mg total) by mouth daily with breakfast. 08/17/23   Pearlean, Courage, MD  atorvastatin  (LIPITOR) 20 MG tablet Take 20 mg by mouth daily at 6 PM.     [provider]  carvedilol  (COREG ) 25 MG tablet Take 25 mg by mouth 2 (two) times daily with a meal.    [provider]  donepezil  (ARICEPT ) 10 MG tablet Take 10 mg by mouth every evening.    [provider]  folic acid  (FOLVITE ) 1 MG tablet Take 1 tablet by mouth daily. 01/25/23   [provider]  furosemide  (LASIX ) 40 MG tablet Take 40 mg by mouth every morning. 07/25/23   [provider]  memantine  (NAMENDA ) 10 MG tablet Take 10 mg by mouth 2 (two) times daily.     [provider]  metoCLOPramide  (REGLAN ) 5 MG tablet Take 1 tablet (5 mg total) by mouth every 8 (eight) hours as needed for nausea or vomiting. Patient not taking: Reported on 09/14/2023 08/17/23 08/16/24  Pearlean Manus, MD  Omega-3 Fatty Acids (FISH OIL ) 1000 MG CAPS Take 1,000 mg by mouth 2 (two) times daily.    [provider]  pantoprazole  (PROTONIX ) 40 MG tablet Take 1 tablet (40 mg total) by mouth 2 (two) times daily. 08/17/23   Pearlean Manus, MD  sodium bicarbonate  650 MG tablet Take 1 tablet (650 mg total) by mouth 2 (two) times daily. 08/17/23   Pearlean Manus, MD  terazosin  (HYTRIN ) 5 MG capsule Take 5 mg by mouth at bedtime.    [provider]  traZODone  (DESYREL ) 100 MG tablet Take 1 tablet (100 mg total) by mouth at bedtime as needed for sleep (Insomnia). 08/17/23   Pearlean Manus, MD  TRESIBA FLEXTOUCH 100 UNIT/ML FlexTouch Pen Inject 30 Units into the skin 2 (two) times daily. 07/30/23   [provider]    Allergies: Penicillins    Review of Systems  Updated Vital Signs BP 132/71   Pulse 60   Temp 98 F (36.7 C) (Oral)   Resp 16   Ht 5' 7 (1.702 m)   Wt 112.3 kg  SpO2 96%   BMI 38.78 kg/m   Physical Exam Vitals and nursing note reviewed.  Constitutional:      General: He is not in acute distress.    Appearance: He is well-developed.  HENT:     Head: Normocephalic and atraumatic.     Mouth/Throat:     Mouth: Mucous membranes are moist.  Eyes:     Conjunctiva/sclera: Conjunctivae normal.  Cardiovascular:     Rate and Rhythm: Normal rate and regular rhythm.     Heart sounds: No murmur heard. Pulmonary:     Effort: Pulmonary effort is normal. No respiratory distress.     Breath sounds: Normal breath sounds.  Abdominal:     Palpations: Abdomen is soft.     Tenderness: There is no abdominal tenderness.  Musculoskeletal:        General: No swelling.     Cervical back: Neck supple.  Skin:    General: Skin is warm and dry.      Capillary Refill: Capillary refill takes less than 2 seconds.  Neurological:     General: No focal deficit present.     Mental Status: He is alert and oriented to person, place, and time.  Psychiatric:        Mood and Affect: Mood normal.     (all labs ordered are listed, but only abnormal results are displayed) Labs Reviewed  COMPREHENSIVE METABOLIC PANEL WITH GFR - Abnormal; Notable for the following components:      Result Value   Glucose, Bld 110 (*)    BUN 30 (*)    Creatinine, Ser 2.62 (*)    Calcium  8.7 (*)    Albumin 2.9 (*)    GFR, Estimated 25 (*)    All other components within normal limits  CBC WITH DIFFERENTIAL/PLATELET - Abnormal; Notable for the following components:   RBC 4.08 (*)    Hemoglobin 11.1 (*)    HCT 35.9 (*)    All other components within normal limits  URINALYSIS, ROUTINE W REFLEX MICROSCOPIC    EKG: None  Radiology: DG Chest 2 View Result Date: 09/15/2023 CLINICAL DATA:  cough EXAM: CHEST - 2 VIEW COMPARISON:  February 20, 2023 FINDINGS: Bilateral perihilar interstitial opacities. No lobar consolidation, pleural effusion, or pneumothorax. Mild cardiomegaly. Tortuous aorta with aortic atherosclerosis. No acute fracture or destructive lesions. Multilevel thoracic osteophytosis. IMPRESSION: Bilateral perihilar interstitial opacities, possibly reflective of an atypical/viral infection or interstitial edema. Electronically Signed   By: Rogelia Myers M.D.   On: 09/15/2023 12:01     Procedures   Medications Ordered in the ED  cefTRIAXone  (ROCEPHIN ) 1 g in sodium chloride  0.9 % 100 mL IVPB (has no administration in time range)  azithromycin  (ZITHROMAX ) 500 mg in sodium chloride  0.9 % 250 mL IVPB (has no administration in time range)  sodium chloride  0.9 % bolus 500 mL (500 mLs Intravenous New Bag/Given 09/15/23 1142)                                    Medical Decision Making This patient presents to the ED for concern of cough and confusion,  this involves an extensive number of treatment options, and is a complaint that carries with it a high risk of complications and morbidity.  The differential diagnosis includes pneumonia, CHF, sepsis, dehydration, UTI, other   Co morbidities that complicate the patient evaluation :   Dementia, dysphagia, GERD   Additional  history obtained:  Additional history obtained from EMR External records from outside source obtained and reviewed including GI note from yesterday, recent hospital discharge summary and consult notes, prior labs and imaging   Lab Tests:  I Ordered, and personally interpreted labs.  The pertinent results include: CBC with no leukocytosis, CMP with baseline kidney function UA negative   Imaging Studies ordered:  I ordered imaging studies including chest x-ray which shows bilateral perihilar infiltrates I independently visualized and interpreted imaging within scope of identifying emergent findings  I agree with the radiologist interpretation   Cardiac Monitoring: / EKG:  The patient was maintained on a cardiac monitor.  I personally viewed and interpreted the cardiac monitored which showed an underlying rhythm of: Sinus rhythm   Consultations Obtained:  I requested consultation with the hospitalist Dr. Evonnie,  and discussed lab and imaging findings as well as pertinent plan - they recommend: Admission   Problem List / ED Course / Critical interventions / Medication management  Cough and increased agitation and confusion at home- metabolic encephalopathy likely secondary to pneumonia.  Patient possibly aspirating at home, his wife states he still has some trouble swallowing despite the modified diet.  He is not have any fever or chills he is not hypoxic, satting 96% on room air but has had some increased agitation and confusion at home.  She states he has had some generalized weakness as well.  I discussed chest x-ray findings of likely pneumonia with his wife which  is probably contributing to his generalized weakness and confusion.  We discussed admission versus discharge with outpatient antibiotics and she feels he would do better if admitted since he has been very weak, he has some trouble taking medications.  Discussed with Dr. Evonnie as above for admission.  I have reviewed the patients home medicines and have made adjustments as needed   Social Determinants of Health:  Patient lives with his wife    Amount and/or Complexity of Data Reviewed Independent Historian: spouse External Data Reviewed: labs and notes. Labs: ordered. Radiology: ordered.  Risk Decision regarding hospitalization.        Final diagnoses:  Pneumonia of both lungs due to infectious organism, unspecified part of lung  Metabolic encephalopathy    ED Discharge Orders     None          Suellen Sherran LABOR, PA-C 09/15/23 1420    Kammerer, Megan L, DO 09/15/23 1617

## 2023-09-16 ENCOUNTER — Observation Stay (HOSPITAL_COMMUNITY)

## 2023-09-16 DIAGNOSIS — Z79899 Other long term (current) drug therapy: Secondary | ICD-10-CM | POA: Diagnosis not present

## 2023-09-16 DIAGNOSIS — G9341 Metabolic encephalopathy: Secondary | ICD-10-CM | POA: Diagnosis not present

## 2023-09-16 DIAGNOSIS — I6782 Cerebral ischemia: Secondary | ICD-10-CM | POA: Diagnosis not present

## 2023-09-16 DIAGNOSIS — I130 Hypertensive heart and chronic kidney disease with heart failure and stage 1 through stage 4 chronic kidney disease, or unspecified chronic kidney disease: Secondary | ICD-10-CM | POA: Diagnosis not present

## 2023-09-16 DIAGNOSIS — E1122 Type 2 diabetes mellitus with diabetic chronic kidney disease: Secondary | ICD-10-CM | POA: Diagnosis not present

## 2023-09-16 DIAGNOSIS — J690 Pneumonitis due to inhalation of food and vomit: Secondary | ICD-10-CM | POA: Diagnosis not present

## 2023-09-16 DIAGNOSIS — Z880 Allergy status to penicillin: Secondary | ICD-10-CM | POA: Diagnosis not present

## 2023-09-16 DIAGNOSIS — R059 Cough, unspecified: Secondary | ICD-10-CM | POA: Diagnosis not present

## 2023-09-16 DIAGNOSIS — Z6838 Body mass index (BMI) 38.0-38.9, adult: Secondary | ICD-10-CM | POA: Diagnosis not present

## 2023-09-16 DIAGNOSIS — E782 Mixed hyperlipidemia: Secondary | ICD-10-CM | POA: Diagnosis not present

## 2023-09-16 DIAGNOSIS — Z7982 Long term (current) use of aspirin: Secondary | ICD-10-CM | POA: Diagnosis not present

## 2023-09-16 DIAGNOSIS — I5031 Acute diastolic (congestive) heart failure: Secondary | ICD-10-CM

## 2023-09-16 DIAGNOSIS — Z87891 Personal history of nicotine dependence: Secondary | ICD-10-CM | POA: Diagnosis not present

## 2023-09-16 DIAGNOSIS — Z96652 Presence of left artificial knee joint: Secondary | ICD-10-CM | POA: Diagnosis not present

## 2023-09-16 DIAGNOSIS — M1990 Unspecified osteoarthritis, unspecified site: Secondary | ICD-10-CM | POA: Diagnosis not present

## 2023-09-16 DIAGNOSIS — Z794 Long term (current) use of insulin: Secondary | ICD-10-CM | POA: Diagnosis not present

## 2023-09-16 DIAGNOSIS — I5032 Chronic diastolic (congestive) heart failure: Secondary | ICD-10-CM | POA: Insufficient documentation

## 2023-09-16 DIAGNOSIS — F0390 Unspecified dementia without behavioral disturbance: Secondary | ICD-10-CM | POA: Diagnosis not present

## 2023-09-16 DIAGNOSIS — R1310 Dysphagia, unspecified: Secondary | ICD-10-CM | POA: Diagnosis not present

## 2023-09-16 DIAGNOSIS — K219 Gastro-esophageal reflux disease without esophagitis: Secondary | ICD-10-CM | POA: Diagnosis not present

## 2023-09-16 DIAGNOSIS — N184 Chronic kidney disease, stage 4 (severe): Secondary | ICD-10-CM | POA: Diagnosis not present

## 2023-09-16 DIAGNOSIS — E66812 Obesity, class 2: Secondary | ICD-10-CM | POA: Diagnosis not present

## 2023-09-16 LAB — GLUCOSE, CAPILLARY
Glucose-Capillary: 107 mg/dL — ABNORMAL HIGH (ref 70–99)
Glucose-Capillary: 145 mg/dL — ABNORMAL HIGH (ref 70–99)
Glucose-Capillary: 97 mg/dL (ref 70–99)
Glucose-Capillary: 165 mg/dL — ABNORMAL HIGH (ref 70–99)

## 2023-09-16 LAB — ECHOCARDIOGRAM COMPLETE
AR max vel: 3.16 cm2
AV Area VTI: 3.67 cm2
AV Area mean vel: 3.01 cm2
AV Mean grad: 2 mmHg
AV Peak grad: 3.5 mmHg
Ao pk vel: 0.93 m/s
Area-P 1/2: 1.82 cm2
Height: 67 in
S' Lateral: 2.9 cm
Weight: 3961.61 [oz_av]

## 2023-09-16 LAB — BASIC METABOLIC PANEL WITH GFR
Anion gap: 13 (ref 5–15)
BUN: 29 mg/dL — ABNORMAL HIGH (ref 8–23)
CO2: 26 mmol/L (ref 22–32)
Calcium: 9.4 mg/dL (ref 8.9–10.3)
Chloride: 101 mmol/L (ref 98–111)
Creatinine, Ser: 2.46 mg/dL — ABNORMAL HIGH (ref 0.61–1.24)
GFR, Estimated: 26 mL/min — ABNORMAL LOW (ref >60)
Glucose, Bld: 146 mg/dL — ABNORMAL HIGH (ref 70–99)
Potassium: 4.2 mmol/L (ref 3.5–5.1)
Sodium: 140 mmol/L (ref 135–145)

## 2023-09-16 LAB — PROCALCITONIN: Procalcitonin: <0.1 ng/mL

## 2023-09-16 LAB — MAGNESIUM: Magnesium: 1.6 mg/dL — ABNORMAL LOW (ref 1.7–2.4)

## 2023-09-16 MED ORDER — OMEGA-3-ACID ETHYL ESTERS 1 G PO CAPS
1.0000 g | ORAL_CAPSULE | Freq: Two times a day (BID) | ORAL | Status: DC
  Administered 2023-09-16 – 2023-09-18 (×5): 1 g via ORAL
  Filled 2023-09-16 (×5): qty 1

## 2023-09-16 MED ORDER — CARVEDILOL 3.125 MG PO TABS
3.1250 mg | ORAL_TABLET | Freq: Two times a day (BID) | ORAL | Status: DC
  Administered 2023-09-16 – 2023-09-18 (×4): 3.125 mg via ORAL
  Filled 2023-09-16 (×4): qty 1

## 2023-09-16 MED ORDER — ASPIRIN 81 MG PO TBEC
81.0000 mg | DELAYED_RELEASE_TABLET | Freq: Every day | ORAL | Status: DC
  Administered 2023-09-16 – 2023-09-18 (×3): 81 mg via ORAL
  Filled 2023-09-16 (×3): qty 1

## 2023-09-16 MED ORDER — ONDANSETRON HCL 4 MG/2ML IJ SOLN: 4.0000 mg | Freq: Four times a day (QID) | INTRAMUSCULAR | Status: DC | PRN

## 2023-09-16 MED ORDER — FISH OIL 1000 MG PO CAPS: 1000.0000 mg | ORAL_CAPSULE | Freq: Two times a day (BID) | ORAL | Status: DC

## 2023-09-16 MED ORDER — DONEPEZIL HCL 5 MG PO TABS
10.0000 mg | ORAL_TABLET | Freq: Every evening | ORAL | Status: DC
  Administered 2023-09-16 – 2023-09-17 (×2): 10 mg via ORAL
  Filled 2023-09-16 (×2): qty 2

## 2023-09-16 MED ORDER — ACETAMINOPHEN 325 MG PO TABS
650.0000 mg | ORAL_TABLET | ORAL | Status: DC | PRN
Start: 2023-09-16 — End: 2023-09-18

## 2023-09-16 MED ORDER — FUROSEMIDE 10 MG/ML IJ SOLN
40.0000 mg | Freq: Every day | INTRAMUSCULAR | Status: DC
  Administered 2023-09-16 – 2023-09-17 (×2): 40 mg via INTRAVENOUS
  Filled 2023-09-16 (×2): qty 4

## 2023-09-16 MED ORDER — ATORVASTATIN CALCIUM 20 MG PO TABS
20.0000 mg | ORAL_TABLET | Freq: Every day | ORAL | Status: DC
Start: 2023-09-16 — End: 2023-09-18
  Administered 2023-09-16 – 2023-09-17 (×2): 20 mg via ORAL
  Filled 2023-09-16 (×2): qty 1

## 2023-09-16 MED ORDER — INSULIN ASPART 100 UNIT/ML IJ SOLN
0.0000 [IU] | Freq: Three times a day (TID) | INTRAMUSCULAR | Status: DC
  Administered 2023-09-16: 1 [IU] via SUBCUTANEOUS
  Administered 2023-09-17: 2 [IU] via SUBCUTANEOUS
  Administered 2023-09-17: 1 [IU] via SUBCUTANEOUS
  Administered 2023-09-17 – 2023-09-18 (×2): 2 [IU] via SUBCUTANEOUS
  Administered 2023-09-18: 1 [IU] via SUBCUTANEOUS

## 2023-09-16 MED ORDER — PANTOPRAZOLE SODIUM 40 MG PO TBEC
40.0000 mg | DELAYED_RELEASE_TABLET | Freq: Two times a day (BID) | ORAL | Status: DC
  Administered 2023-09-16 – 2023-09-18 (×5): 40 mg via ORAL
  Filled 2023-09-16 (×5): qty 1

## 2023-09-16 MED ORDER — ASPIRIN 81 MG PO TABS: 81.0000 mg | ORAL_TABLET | Freq: Every day | ORAL | Status: DC

## 2023-09-16 MED ORDER — SODIUM CHLORIDE 0.9 % IV SOLN: 250.0000 mL | INTRAVENOUS | Status: DC | PRN

## 2023-09-16 MED ORDER — ALLOPURINOL 100 MG PO TABS
100.0000 mg | ORAL_TABLET | Freq: Every day | ORAL | Status: DC
  Administered 2023-09-16 – 2023-09-18 (×3): 100 mg via ORAL
  Filled 2023-09-16 (×3): qty 1

## 2023-09-16 MED ORDER — SODIUM CHLORIDE 0.9 % IV SOLN
1.0000 g | INTRAVENOUS | Status: DC
  Administered 2023-09-16 – 2023-09-18 (×3): 1 g via INTRAVENOUS
  Filled 2023-09-16 (×3): qty 10

## 2023-09-16 MED ORDER — SODIUM CHLORIDE 0.9% FLUSH: 3.0000 mL | INTRAVENOUS | Status: DC | PRN

## 2023-09-16 MED ORDER — SODIUM CHLORIDE 0.9% FLUSH
3.0000 mL | Freq: Two times a day (BID) | INTRAVENOUS | Status: DC
  Administered 2023-09-16 – 2023-09-17 (×3): 3 mL via INTRAVENOUS

## 2023-09-16 MED ORDER — FOLIC ACID 1 MG PO TABS
1.0000 mg | ORAL_TABLET | Freq: Every day | ORAL | Status: DC
  Administered 2023-09-16 – 2023-09-18 (×3): 1 mg via ORAL
  Filled 2023-09-16 (×3): qty 1

## 2023-09-16 MED ORDER — CARVEDILOL 3.125 MG PO TABS
6.2500 mg | ORAL_TABLET | Freq: Two times a day (BID) | ORAL | Status: DC
Start: 2023-09-16 — End: 2023-09-16
  Administered 2023-09-16: 6.25 mg via ORAL
  Filled 2023-09-16: qty 2

## 2023-09-16 MED ORDER — INSULIN ASPART 100 UNIT/ML IJ SOLN: 0.0000 [IU] | Freq: Every day | INTRAMUSCULAR | Status: DC

## 2023-09-16 MED ORDER — HEPARIN SODIUM (PORCINE) 5000 UNIT/ML IJ SOLN
5000.0000 [IU] | Freq: Three times a day (TID) | INTRAMUSCULAR | Status: DC
  Administered 2023-09-16 – 2023-09-18 (×7): 5000 [IU] via SUBCUTANEOUS
  Filled 2023-09-16 (×7): qty 1

## 2023-09-16 MED ORDER — MEMANTINE HCL 10 MG PO TABS
10.0000 mg | ORAL_TABLET | Freq: Two times a day (BID) | ORAL | Status: DC
  Administered 2023-09-16 – 2023-09-18 (×5): 10 mg via ORAL
  Filled 2023-09-16 (×5): qty 1

## 2023-09-16 NOTE — Progress Notes (Signed)
   09/16/23 1221  TOC Brief Assessment  Insurance and Status Reviewed  Patient has primary care physician Yes  Home environment has been reviewed From home c/wife  Prior level of function: Assisted  Prior/Current Home Services No current home services  Social Drivers of Health Review SDOH reviewed no interventions necessary  Readmission risk has been reviewed Yes  Transition of care needs no transition of care needs at this time   Pt from home c/wife. Wife states she expects him to return home at dc. No DME or oxygen  currently in use at home.   Transition of Care Department [facility]) has reviewed patient and no TOC needs have been identified at this time. We will continue to monitor patient advancement through interdisciplinary progression rounds. If new patient transition needs arise, please place a TOC consult.

## 2023-09-16 NOTE — Evaluation (Signed)
 Clinical/Bedside Swallow Evaluation Patient Details  Name: Terry Stevens MRN: [id number] Date of Birth: 02/18/1945  Today's Date: 09/16/2023 Time: SLP Start Time (ACUTE ONLY): 1130 SLP Stop Time (ACUTE ONLY): 1200 SLP Time Calculation (min) (ACUTE ONLY): 30 min  Past Medical History:  Past Medical History:  Diagnosis Date   Arthritis    Constipation    Dementia (HCC)    Diabetes mellitus without complication (HCC)    GERD (gastroesophageal reflux disease)    tums prn   Gout    Hypertension    Osteoarthritis of left knee 02/27/2013   Past Surgical History:  Past Surgical History:  Procedure Laterality Date   CATARACT EXTRACTION W/PHACO Right 06/13/2020   Procedure: CATARACT EXTRACTION PHACO AND INTRAOCULAR LENS PLACEMENT RIGHT EYE;  Surgeon: Harrie Agent, MD;  Location: [facility];  Service: Ophthalmology;  Laterality: Right;  CDE  8.92   CATARACT EXTRACTION W/PHACO Left 06/27/2020   Procedure: CATARACT EXTRACTION PHACO AND INTRAOCULAR LENS PLACEMENT LEFT EYE;  Surgeon: Harrie Agent, MD;  Location: [facility];  Service: Ophthalmology;  Laterality: Left;  left,  CDE=8.21   ESOPHAGOGASTRODUODENOSCOPY (EGD) WITH PROPOFOL  N/A 02/21/2023   Procedure: ESOPHAGOGASTRODUODENOSCOPY (EGD) WITH PROPOFOL ;  Surgeon: Cindie Carlin POUR, DO;  Location: [facility];  Service: Endoscopy;  Laterality: N/A;   HOT HEMOSTASIS  02/21/2023   Procedure: HOT HEMOSTASIS (ARGON PLASMA COAGULATION/BICAP);  Surgeon: Cindie Carlin POUR, DO;  Location: [facility];  Service: Endoscopy;;   NO PAST SURGERIES     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  Wife also states  that he has had increasing cough, chest congestion, and generalized weakness.  He has not had any fevers, chills, headache, neck pain, nausea, vomiting, diarrhea.  She states that he has been increasingly agitated over the past 1 to 2 weeks.  He has had some dyspnea on exertion.  There is not been any chest pain or abdominal pain or hemoptysis.     Notably, the patient had a recent hospital admission from 08/14/2023 to 08/17/2023 due to intractable nausea and vomiting.  He was felt that there was a combination of his gastroparesis, GERD, and hiatal hernia.  At the time of discharge, the patient was instructed to stop his daily furosemide  dosing until 08/22/2023 which the patient's spouse states she has resumed.     The patient spouse also relates that he continues to have some intermittent difficulty with swallowing.  She also notes that he has had increasing lower extremity edema and increasing abdominal girth over the past month.  CXR 09/15/23: Bilateral perihilar interstitial opacities, possibly reflective of an atypical/viral infection or interstitial edema.   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  Clinical Impression  Pt seen for bedside swallow assessment in the setting of concern for aspiration PNA. Of note with with recent history of GI related admission/complaints, refer to EGD results for details. Recent BSE completed on 7/8, with pt recommended for chopped and thin liquids secondary to cognitive impact on oral phase. Spouse present for 7/8 eval, not present today.   Today, pt resting in chair,  alert and pleasantly confused. Pt seen with trials of thin liquids (via straw), puree, and regular solids. No overt or subtle s/sx pharyngeal dysphagia noted. No change to vocal quality across trials. Vitals stable for duration of trials (O2 saturations maintained at 99-100). Oral phase  notable for reduced attention to oral cavity/bolus, leading to extended time for mastication and oral clearance. Pt edentulous without dentured present for session, negatively impacting mastication efficiency.   Overall presentation is grossly the same as swallow function recorded for assessment on 7/8. Spouse not present for further identification of intermittent difficulty with swallowing as reported in H&P. If desired by pt/family, pt could benefit from OP follow up for further education on dysphagia. MD made aware.  Pt is at increased risk for aspiration based on cognition, GI/esophageal concerns, dependency with feeding, and deconditioning. Aspiration precautions (slow rate, small bites, elevated HOB, alert for PO intake) and esophageal precautions (elevated HOB at least 30 min after intake) recommended. Recommend Dys 2 with thin liquids and meds whole in thin (if pt begins exhibiting oral holding again, give meds crushed in puree). Supervision and assistance with meals.   No follow up SLP services indicated acutely.    SLP Visit Diagnosis: Dysphagia, oral phase (R13.11) (with suspected esophageal component)    Aspiration Risk  Moderate aspiration risk    Diet Recommendation   Dysphagia 2 (chopped);Thin  Medication Administration: Whole meds with liquid (if oral holding- crush in puree)    Other  Recommendations Recommended Consults: Consider GI evaluation;Consider esophageal assessment (if GI symptoms worsen) Oral Care Recommendations: Oral care prior to ice chip/H20;Staff/trained caregiver to provide oral care     Assistance Recommended at Discharge  Supervision and assist with PO intake  Functional Status Assessment Patient has not had a recent decline in their functional status (appears to be at baseline compared to assessment on 7/8)    Swallow Study   General Date of Onset: 09/16/23 HPI: Per H&P: Terry Stevens is a 78 year old male with a history of dementia, cardiac arrest,  hypertension, diabetes mellitus type 2, CKD stage IV, GERD, and dysphagia presenting with 2-week history of altered mental status.  Wife also states that he has had increasing cough, chest congestion, and generalized weakness.  He has not had any fevers, chills, headache, neck pain, nausea, vomiting, diarrhea.  She states that he has been increasingly agitated over the past 1 to 2 weeks.  He has had some dyspnea on exertion.  There is not been any chest pain or abdominal pain or hemoptysis.     Notably, the patient had a recent hospital admission from 08/14/2023 to 08/17/2023 due to intractable nausea and vomiting.  He was felt that there was a combination of his gastroparesis, GERD, and hiatal hernia.  At the time of discharge, the patient was instructed to stop his daily furosemide  dosing until 08/22/2023 which the patient's spouse states she has resumed.     The patient spouse also relates that he continues to have some intermittent difficulty with swallowing.  She also notes that he has had increasing lower extremity edema and increasing abdominal girth over the past month. Terry Stevens is a 78 year old male with a history of dementia, cardiac arrest, hypertension, diabetes mellitus type 2, CKD stage IV, GERD, and dysphagia presenting with 2-week history of altered mental status.  Wife also states that he has had increasing cough, chest congestion, and generalized weakness.  He has not had any fevers, chills, headache, neck pain, nausea, vomiting, diarrhea.  She  states that he has been increasingly agitated over the past 1 to 2 weeks.  He has had some dyspnea on exertion.  There is not been any chest pain or abdominal pain or hemoptysis.     Notably, the patient had a recent hospital admission from 08/14/2023 to 08/17/2023 due to intractable nausea and vomiting.  He was felt that there was a combination of his gastroparesis, GERD, and hiatal hernia.  At the time of discharge, the patient was instructed to stop his  daily furosemide  dosing until 08/22/2023 which the patient's spouse states she has resumed.     The patient spouse also relates that he continues to have some intermittent difficulty with swallowing.  She also notes that he has had increasing lower extremity edema and increasing abdominal girth over the past month. Type of Study: Bedside Swallow Evaluation Previous Swallow Assessment: BSE completed on 7/8, revealing,  mild oral phase dysphagia likely cognitive in nature given dementia dx. Deficits characterized by oral holding and prolonged mastication. Recommend Dys 2 with thin liquids Diet Prior to this Study: Regular;Thin liquids (Level 0) Temperature Spikes Noted: No Respiratory Status: Room air History of Recent Intubation: No Behavior/Cognition: Alert;Confused;Pleasant mood Oral Cavity Assessment: Within Functional Limits Oral Care Completed by SLP: Recent completion by staff Oral Cavity - Dentition: Edentulous (dentures not present in the room) Vision: Functional for self-feeding Self-Feeding Abilities: Needs assist Patient Positioning: Upright in chair Baseline Vocal Quality: Normal Volitional Cough: Cognitively unable to elicit Volitional Swallow: Unable to elicit    Oral/Motor/Sensory Function Overall Oral Motor/Sensory Function: Within functional limits   Ice Chips Ice chips: Not tested   Thin Liquid Thin Liquid: Within functional limits Presentation: Straw;Self Fed    Nectar Thick Nectar Thick Liquid: Not tested   Honey Thick Honey Thick Liquid: Not tested   Puree Puree: Within functional limits   Solid     Solid: Impaired Presentation:  (assisted) Oral Phase Impairments: Poor awareness of bolus;Impaired mastication Oral Phase Functional Implications: Impaired mastication;Prolonged oral transit Pharyngeal Phase Impairments:  (none)     Terry Camp Gopal Clapp, MS, CCC-SLP Speech Language Pathologist   Terry Stevens 09/16/2023,12:24 PM

## 2023-09-16 NOTE — Care Management Obs Status (Signed)
 MEDICARE OBSERVATION STATUS NOTIFICATION   Patient Details  Name: Terry Stevens MRN: [id number] Date of Birth: 01/10/1946   Medicare Observation Status Notification Given:  Yes    Nena LITTIE Coffee, RN 09/16/2023, 12:18 PM

## 2023-09-16 NOTE — Progress Notes (Signed)
 Mobility Specialist Progress Note:    09/16/23 1035  Mobility  Activity Pivoted/transferred from bed to chair  Level of Assistance Maximum assist, patient does 25-49% (+2)  Assistive Device Front wheel walker  Distance Ambulated (ft) 3 ft  Range of Motion/Exercises Active;All extremities  Activity Response Tolerated well  Mobility Referral Yes  Mobility visit 1 Mobility  Mobility Specialist Start Time (ACUTE ONLY) 1035  Mobility Specialist Stop Time (ACUTE ONLY) 1055  Mobility Specialist Time Calculation (min) (ACUTE ONLY) 20 min   Pt received in bed, NT requesting assistance transferring to chair. Required MaxA +2 to stand and transfer with RW. Tolerated well, pt had trouble taking steps once standing. Alarm on, call bell in reach. All needs met.  Keshawna Dix Mobility Specialist Please contact via Special educational needs teacher or  Rehab office at [phone]

## 2023-09-16 NOTE — Progress Notes (Addendum)
 PROGRESS NOTE  Terry Stevens FMW:[id number] DOB: 02/12/1945 DOA: 09/15/2023 PCP: Orpha Yancey LABOR, MD  Brief History:  78 year old male with a history of dementia, cardiac arrest, hypertension, diabetes mellitus type 2, CKD stage IV, GERD, and dysphagia presenting with 2-week history of altered mental status.  Wife also states that he has had increasing cough, chest congestion, and generalized weakness.  He has not had any fevers, chills, headache, neck pain, nausea, vomiting, diarrhea.  She states that he has been increasingly agitated over the past 1 to 2 weeks.  He has had some dyspnea on exertion.  There is not been any chest pain or abdominal pain or hemoptysis.  Notably, the patient had a recent hospital admission from 08/14/2023 to 08/17/2023 due to intractable nausea and vomiting.  He was felt that there was a combination of his gastroparesis, GERD, and hiatal hernia.  At the time of discharge, the patient was instructed to stop his daily furosemide  dosing until 08/22/2023 which the patient's spouse states she has resumed.  The patient spouse also relates that he continues to have some intermittent difficulty with swallowing.  She also notes that he has had increasing lower extremity edema and increasing abdominal girth over the past month.  In the ED, the patient was afebrile and hemodynamically stable with oxygen  saturation 92-96% on room air.  WBC 6.6, hemoglobin 11.1, platelets 160.  Sodium 138, potassium 4.3, bicarbonate 24, serum creatinine 2.62.  LFTs were unremarkable.  Chest x-ray showed bilateral interstitial opacities.  The patient was started on ceftriaxone  and azithromycin .   Assessment/Plan:  Acute HFpEF -continue IV lasix  -remains clinically fluid overloaded -daily weights -accurate I/Os - 8/8 Echo EF 65-70%, no WMA, G1DD, normal RVF   Aspiration pneumonia -speech therapy eval>>dys 2 with thin -continue ceftriaxone  and azithro for now - Check viral respiratory  panel--negative   Acute metabolic encephalopathy - At baseline, the patient is &O x 2, conversant, but needs some assistance with ADLs - more awake and alert, but confused - Secondary to ADHF and infectious process - UA negative for pyuria - B12 - TSH - folate   Diabetes mellitus type 2 with hypoglycemia - Spouse states that she has only been giving long-acting insulin  at home 3-4 times a week because of frequent lows - 08/14/2023 hemoglobin A1c 7.5 - NovoLog  sliding scale - Holding Semglee for now   CKD stage IV - Baseline creatinine 2.3-2.6   Essential hypertension - Continue carvedilol  at lower dose - Holding amlodipine  to allow BP margin for diuresis   Mixed hyperlipidemia - Continue statin   Major neurocognitive disorder - Continue Aricept  and Namenda    GERD - Continue pantoprazole    Class 2 obesity -BMI 38.78 -lifestyle modification          Family Communication:   called wife, cannot leave VM  Consultants:  none  Code Status:  FULL  DVT Prophylaxis:  [place] Heparin     Procedures: As Listed in Progress Note Above  Antibiotics: Ceftriaxone  8/7>> Azithro 8/7>>       Subjective: Patient is pleasantly confused.  He denies any chest pain or shortness of breath.  Remainder review of systems is unobtainable secondary to his confusion, dementia.  Objective: Vitals:   09/15/23 1949 09/15/23 2316 09/16/23 0333 09/16/23 1208  BP: 135/68 134/68 119/80 107/87  Pulse: (!) 52 (!) 57 (!) 59 61  Resp:      Temp: 98.2 F (36.8 C) 98.2 F (36.8 C) 98.2 F (  36.8 C) 97.8 F (36.6 C)  TempSrc: Oral Oral Oral Oral  SpO2: 91% 92% 97% 96%  Weight:      Height:        Intake/Output Summary (Last 24 hours) at 09/16/2023 1642 Last data filed at 09/16/2023 1504 Gross per 24 hour  Intake 638.91 ml  Output 2350 ml  Net -1711.09 ml   Weight change:  Exam:  General:  Pt is alert, follows commands appropriately, not in acute distress HEENT: No icterus, No  thrush, No neck mass, [place]/AT Cardiovascular: RRR, S1/S2, no rubs, no gallops Respiratory: Bibasilar crackles.  No wheezing.  Good air movement Abdomen: Soft/+BS, non tender, non distended, no guarding Extremities: 1 + LE edema, No lymphangitis, No petechiae, No rashes, no synovitis   Data Reviewed: I have personally reviewed following labs and imaging studies Basic Metabolic Panel: Recent Labs  Lab 09/15/23 1045 09/16/23 1136  NA 138 140  K 4.3 4.2  CL 102 101  CO2 24 26  GLUCOSE 110* 146*  BUN 30* 29*  CREATININE 2.62* 2.46*  CALCIUM  8.7* 9.4  MG  --  1.6*   Liver Function Tests: Recent Labs  Lab 09/15/23 1045  AST 15  ALT 13  ALKPHOS 104  BILITOT 1.0  PROT 7.5  ALBUMIN 2.9*   No results for input(s): LIPASE, AMYLASE in the last 168 hours. No results for input(s): AMMONIA in the last 168 hours. Coagulation Profile: No results for input(s): INR, PROTIME in the last 168 hours. CBC: Recent Labs  Lab 09/15/23 1045  WBC 6.6  NEUTROABS 4.3  HGB 11.1*  HCT 35.9*  MCV 88.0  PLT 160   Cardiac Enzymes: No results for input(s): CKTOTAL, CKMB, CKMBINDEX, TROPONINI in the last 168 hours. BNP: Invalid input(s): POCBNP CBG: Recent Labs  Lab 09/16/23 0721 09/16/23 1108 09/16/23 1615  GLUCAP 107* 145* 97   HbA1C: No results for input(s): HGBA1C in the last 72 hours. Urine analysis:    Component Value Date/Time   COLORURINE YELLOW 09/15/2023 1025   APPEARANCEUR CLEAR 09/15/2023 1025   LABSPEC 1.013 09/15/2023 1025   PHURINE 6.0 09/15/2023 1025   GLUCOSEU NEGATIVE 09/15/2023 1025   HGBUR NEGATIVE 09/15/2023 1025   BILIRUBINUR NEGATIVE 09/15/2023 1025   KETONESUR NEGATIVE 09/15/2023 1025   PROTEINUR NEGATIVE 09/15/2023 1025   NITRITE NEGATIVE 09/15/2023 1025   LEUKOCYTESUR NEGATIVE 09/15/2023 1025   Sepsis Labs: @LABRCNTIP (procalcitonin:4,lacticidven:4) ) Recent Results (from the past 240 hours)  Resp panel by RT-PCR (RSV, Flu A&B,  Covid) Anterior Nasal Swab     Status: None   Collection Time: 09/15/23  3:33 PM   Specimen: Anterior Nasal Swab  Result Value Ref Range Status   SARS Coronavirus 2 by RT PCR NEGATIVE NEGATIVE Final    Comment: (NOTE) SARS-CoV-2 target nucleic acids are NOT DETECTED.  The SARS-CoV-2 RNA is generally detectable in upper respiratory specimens during the acute phase of infection. The lowest concentration of SARS-CoV-2 viral copies this assay can detect is 138 copies/mL. A negative result does not preclude SARS-Cov-2 infection and should not be used as the sole basis for treatment or other patient management decisions. A negative result may occur with  improper specimen collection/handling, submission of specimen other than nasopharyngeal swab, presence of viral mutation(s) within the areas targeted by this assay, and inadequate number of viral copies(<138 copies/mL). A negative result must be combined with clinical observations, patient history, and epidemiological information. The expected result is Negative.  Fact Sheet for Patients:  BloggerCourse.com  Fact Sheet  for Healthcare Providers:  SeriousBroker.it  This test is no t yet approved or cleared by the [place]  FDA and  has been authorized for detection and/or diagnosis of SARS-CoV-2 by FDA under an Emergency Use Authorization (EUA). This EUA will remain  in effect (meaning this test can be used) for the duration of the COVID-19 declaration under Section 564(b)(1) of the Act, 21 U.S.C.section 360bbb-3(b)(1), unless the authorization is terminated  or revoked sooner.       Influenza A by PCR NEGATIVE NEGATIVE Final   Influenza B by PCR NEGATIVE NEGATIVE Final    Comment: (NOTE) The Xpert Xpress SARS-CoV-2/FLU/RSV plus assay is intended as an aid in the diagnosis of influenza from Nasopharyngeal swab specimens and should not be used as a sole basis for treatment. Nasal  washings and aspirates are unacceptable for Xpert Xpress SARS-CoV-2/FLU/RSV testing.  Fact Sheet for Patients: BloggerCourse.com  Fact Sheet for Healthcare Providers: SeriousBroker.it  This test is not yet approved or cleared by the [place]  FDA and has been authorized for detection and/or diagnosis of SARS-CoV-2 by FDA under an Emergency Use Authorization (EUA). This EUA will remain in effect (meaning this test can be used) for the duration of the COVID-19 declaration under Section 564(b)(1) of the Act, 21 U.S.C. section 360bbb-3(b)(1), unless the authorization is terminated or revoked.     Resp Syncytial Virus by PCR NEGATIVE NEGATIVE Final    Comment: (NOTE) Fact Sheet for Patients: BloggerCourse.com  Fact Sheet for Healthcare Providers: SeriousBroker.it  This test is not yet approved or cleared by the [place]  FDA and has been authorized for detection and/or diagnosis of SARS-CoV-2 by FDA under an Emergency Use Authorization (EUA). This EUA will remain in effect (meaning this test can be used) for the duration of the COVID-19 declaration under Section 564(b)(1) of the Act, 21 U.S.C. section 360bbb-3(b)(1), unless the authorization is terminated or revoked.  Performed at [facility], [address], [place], [place] [address]   Respiratory (~20 pathogens) panel by PCR     Status: None   Collection Time: 09/15/23  3:33 PM   Specimen: Nasopharyngeal Swab; Respiratory  Result Value Ref Range Status   Adenovirus NOT DETECTED NOT DETECTED Final   Coronavirus 229E NOT DETECTED NOT DETECTED Final    Comment: (NOTE) The Coronavirus on the Respiratory Panel, DOES NOT test for the novel  Coronavirus (2019 nCoV)    Coronavirus HKU1 NOT DETECTED NOT DETECTED Final   Coronavirus NL63 NOT DETECTED NOT DETECTED Final   Coronavirus OC43 NOT DETECTED NOT DETECTED Final    Metapneumovirus NOT DETECTED NOT DETECTED Final   Rhinovirus / Enterovirus NOT DETECTED NOT DETECTED Final   Influenza A NOT DETECTED NOT DETECTED Final   Influenza B NOT DETECTED NOT DETECTED Final   Parainfluenza Virus 1 NOT DETECTED NOT DETECTED Final   Parainfluenza Virus 2 NOT DETECTED NOT DETECTED Final   Parainfluenza Virus 3 NOT DETECTED NOT DETECTED Final   Parainfluenza Virus 4 NOT DETECTED NOT DETECTED Final   Respiratory Syncytial Virus NOT DETECTED NOT DETECTED Final   Bordetella pertussis NOT DETECTED NOT DETECTED Final   Bordetella Parapertussis NOT DETECTED NOT DETECTED Final   Chlamydophila pneumoniae NOT DETECTED NOT DETECTED Final   Mycoplasma pneumoniae NOT DETECTED NOT DETECTED Final    Comment: Performed at [facility], [address] [place], [place] [address]     Scheduled Meds:  allopurinol   100 mg Oral Daily   aspirin  EC  81 mg Oral Daily  atorvastatin   20 mg Oral q1800   carvedilol   3.125 mg Oral BID WC   donepezil   10 mg Oral QPM   folic acid   1 mg Oral Daily   furosemide   40 mg Intravenous Daily   heparin   5,000 Units Subcutaneous Q8H   insulin  aspart  0-5 Units Subcutaneous QHS   insulin  aspart  0-9 Units Subcutaneous TID WC   memantine   10 mg Oral BID   omega-3 acid ethyl esters  1 g Oral BID   pantoprazole   40 mg Oral BID   sodium chloride  flush  3 mL Intravenous Q12H   Continuous Infusions:  sodium chloride      azithromycin  Stopped (09/16/23 1401)   cefTRIAXone  (ROCEPHIN )  IV Stopped (09/16/23 1232)    Procedures/Studies: ECHOCARDIOGRAM COMPLETE Result Date: 09/16/2023    ECHOCARDIOGRAM REPORT   Patient Name:   Muriel Barreiro Date of Exam: 09/16/2023 Medical Rec #:  [id number]     Height:       67.0 in Accession #:    [id number]    Weight:       247.6 lb Date of Birth:  [birth year]     BSA:          2.214 m Patient Age:    78 years      BP:           119/80 mmHg Patient Gender: M             HR:           51 bpm. Exam Location:  [facility]  Procedure: 2D Echo, Cardiac Doppler and Color Doppler (Both Spectral and Color            Flow Doppler were utilized during procedure). Indications:    CHF-Acute Diastolic I50.31  History:        Patient has no prior history of Echocardiogram examinations.                 Risk Factors:Hypertension and Diabetes.  Sonographer:    Jayson Gaskins Referring Phys: [phone] Victory Strollo  Sonographer Comments: Image acquisition challenging due to uncooperative patient. IMPRESSIONS  1. Left ventricular ejection fraction, by estimation, is 65 to 70%. The left ventricle has normal function. Left ventricular endocardial border not optimally defined to evaluate regional wall motion. Left ventricular diastolic parameters are consistent with Grade I diastolic dysfunction (impaired relaxation).  2. Right ventricular systolic function is normal. The right ventricular size is normal. Tricuspid regurgitation signal is inadequate for assessing PA pressure.  3. The mitral valve is normal in structure. No evidence of mitral valve regurgitation. No evidence of mitral stenosis.  4. The aortic valve was not well visualized. Aortic valve regurgitation is not visualized. No aortic stenosis is present. FINDINGS  Left Ventricle: Left ventricular ejection fraction, by estimation, is 65 to 70%. The left ventricle has normal function. Left ventricular endocardial border not optimally defined to evaluate regional wall motion. The left ventricular internal cavity size was normal in size. There is no left ventricular hypertrophy. Left ventricular diastolic parameters are consistent with Grade I diastolic dysfunction (impaired relaxation). Normal left ventricular filling pressure. Right Ventricle: The right ventricular size is normal. Right vetricular wall thickness was not well visualized. Right ventricular systolic function is normal. Tricuspid regurgitation signal is inadequate for assessing PA pressure. Left Atrium: Left atrial size was normal in size.  Right Atrium: Right atrial size was normal in size. Pericardium: There is no evidence of pericardial effusion. Mitral Valve: The mitral  valve is normal in structure. No evidence of mitral valve regurgitation. No evidence of mitral valve stenosis. Tricuspid Valve: The tricuspid valve is normal in structure. Tricuspid valve regurgitation is not demonstrated. No evidence of tricuspid stenosis. Aortic Valve: The aortic valve was not well visualized. Aortic valve regurgitation is not visualized. No aortic stenosis is present. Aortic valve mean gradient measures 2.0 mmHg. Aortic valve peak gradient measures 3.5 mmHg. Aortic valve area, by VTI measures 3.67 cm. Pulmonic Valve: The pulmonic valve was not well visualized. Pulmonic valve regurgitation is not visualized. No evidence of pulmonic stenosis. Aorta: The aortic root is normal in size and structure and the ascending aorta was not well visualized. Venous: The inferior vena cava was not well visualized. IAS/Shunts: The interatrial septum was not well visualized.  LEFT VENTRICLE PLAX 2D LVIDd:         4.70 cm   Diastology LVIDs:         2.90 cm   LV e' medial:    4.79 cm/s LV PW:         1.20 cm   LV E/e' medial:  10.4 LV IVS:        1.00 cm   LV e' lateral:   5.87 cm/s LVOT diam:     2.00 cm   LV E/e' lateral: 8.5 LV SV:         77 LV SV Index:   35 LVOT Area:     3.14 cm  RIGHT VENTRICLE RV S prime:     16.10 cm/s TAPSE (M-mode): 2.7 cm LEFT ATRIUM           Index        RIGHT ATRIUM           Index LA Vol (A2C): 15.9 ml 7.18 ml/m   RA Area:     17.80 cm LA Vol (A4C): 53.9 ml 24.34 ml/m  RA Volume:   45.70 ml  20.64 ml/m  AORTIC VALVE AV Area (Vmax):    3.16 cm AV Area (Vmean):   3.01 cm AV Area (VTI):     3.67 cm AV Vmax:           92.90 cm/s AV Vmean:          71.300 cm/s AV VTI:            0.210 m AV Peak Grad:      3.5 mmHg AV Mean Grad:      2.0 mmHg LVOT Vmax:         93.40 cm/s LVOT Vmean:        68.400 cm/s LVOT VTI:          0.245 m LVOT/AV VTI  ratio: 1.17  AORTA Ao Root diam: 3.20 cm MITRAL VALVE MV Area (PHT): 1.82 cm    SHUNTS MV Decel Time: 417 msec    Systemic VTI:  0.24 m MV E velocity: 49.90 cm/s  Systemic Diam: 2.00 cm MV A velocity: 70.00 cm/s MV E/A ratio:  0.71 Dorn Ross MD Electronically signed by Dorn Ross MD Signature Date/Time: 09/16/2023/9:33:15 AM    Final    DG Chest 2 View Result Date: 09/15/2023 CLINICAL DATA:  cough EXAM: CHEST - 2 VIEW COMPARISON:  February 20, 2023 FINDINGS: Bilateral perihilar interstitial opacities. No lobar consolidation, pleural effusion, or pneumothorax. Mild cardiomegaly. Tortuous aorta with aortic atherosclerosis. No acute fracture or destructive lesions. Multilevel thoracic osteophytosis. IMPRESSION: Bilateral perihilar interstitial opacities, possibly reflective of an atypical/viral infection or interstitial edema. Electronically  Signed   By: Rogelia Myers M.D.   On: 09/15/2023 12:01    Alm Schneider, DO  [facility]  If 7PM-7AM, please contact night-coverage [email or web address] Password [email or web address] 09/16/2023, 4:42 PM   LOS: 0 days

## 2023-09-16 NOTE — Plan of Care (Signed)

## 2023-09-17 ENCOUNTER — Inpatient Hospital Stay (HOSPITAL_COMMUNITY)

## 2023-09-17 DIAGNOSIS — G9341 Metabolic encephalopathy: Secondary | ICD-10-CM | POA: Diagnosis not present

## 2023-09-17 DIAGNOSIS — I5031 Acute diastolic (congestive) heart failure: Secondary | ICD-10-CM | POA: Diagnosis not present

## 2023-09-17 DIAGNOSIS — J690 Pneumonitis due to inhalation of food and vomit: Secondary | ICD-10-CM | POA: Diagnosis not present

## 2023-09-17 LAB — BASIC METABOLIC PANEL WITH GFR
Anion gap: 11 (ref 5–15)
BUN: 33 mg/dL — ABNORMAL HIGH (ref 8–23)
CO2: 26 mmol/L (ref 22–32)
Calcium: 9 mg/dL (ref 8.9–10.3)
Chloride: 102 mmol/L (ref 98–111)
Creatinine, Ser: 2.6 mg/dL — ABNORMAL HIGH (ref 0.61–1.24)
GFR, Estimated: 25 mL/min — ABNORMAL LOW (ref >60)
Glucose, Bld: 108 mg/dL — ABNORMAL HIGH (ref 70–99)
Potassium: 4.1 mmol/L (ref 3.5–5.1)
Sodium: 139 mmol/L (ref 135–145)

## 2023-09-17 LAB — GLUCOSE, CAPILLARY
Glucose-Capillary: 122 mg/dL — ABNORMAL HIGH (ref 70–99)
Glucose-Capillary: 157 mg/dL — ABNORMAL HIGH (ref 70–99)
Glucose-Capillary: 187 mg/dL — ABNORMAL HIGH (ref 70–99)
Glucose-Capillary: 143 mg/dL — ABNORMAL HIGH (ref 70–99)

## 2023-09-17 LAB — AMMONIA: Ammonia: 31 umol/L (ref 9–35)

## 2023-09-17 LAB — VITAMIN B12: Vitamin B-12: 237 pg/mL (ref 180–914)

## 2023-09-17 LAB — MAGNESIUM: Magnesium: 1.6 mg/dL — ABNORMAL LOW (ref 1.7–2.4)

## 2023-09-17 LAB — FOLATE: Folate: 34.8 ng/mL (ref >5.9)

## 2023-09-17 LAB — T4, FREE: Free T4: 1.23 ng/dL — ABNORMAL HIGH (ref 0.61–1.12)

## 2023-09-17 LAB — TSH: TSH: 3.003 u[IU]/mL (ref 0.350–4.500)

## 2023-09-17 MED ORDER — CYANOCOBALAMIN 1000 MCG/ML IJ SOLN
1000.0000 ug | Freq: Once | INTRAMUSCULAR | Status: AC
  Administered 2023-09-17: 1000 ug via INTRAMUSCULAR
  Filled 2023-09-17: qty 1

## 2023-09-17 MED ORDER — MAGNESIUM SULFATE 2 GM/50ML IV SOLN
2.0000 g | Freq: Once | INTRAVENOUS | Status: AC
  Administered 2023-09-17: 2 g via INTRAVENOUS
  Filled 2023-09-17: qty 50

## 2023-09-17 MED ORDER — VITAMIN B-12 100 MCG PO TABS
500.0000 ug | ORAL_TABLET | Freq: Every day | ORAL | Status: DC
  Administered 2023-09-18: 500 ug via ORAL
  Filled 2023-09-17: qty 5

## 2023-09-17 NOTE — Plan of Care (Signed)

## 2023-09-17 NOTE — Progress Notes (Addendum)
 PROGRESS NOTE  Terry Stevens FMW:[id number] DOB: [birth year] DOA: 09/15/2023 PCP: Orpha Yancey LABOR, MD  Brief History:  78 year old male with a history of dementia, cardiac arrest, hypertension, diabetes mellitus type 2, CKD stage IV, GERD, and dysphagia presenting with 2-week history of altered mental status.  Wife also states that he has had increasing cough, chest congestion, and generalized weakness.  He has not had any fevers, chills, headache, neck pain, nausea, vomiting, diarrhea.  She states that he has been increasingly agitated over the past 1 to 2 weeks.  He has had some dyspnea on exertion.  There is not been any chest pain or abdominal pain or hemoptysis.  Notably, the patient had a recent hospital admission from 08/14/2023 to 08/17/2023 due to intractable nausea and vomiting.  He was felt that there was a combination of his gastroparesis, GERD, and hiatal hernia.  At the time of discharge, the patient was instructed to stop his daily furosemide  dosing until 08/22/2023 which the patient's spouse states she has resumed.  The patient spouse also relates that he continues to have some intermittent difficulty with swallowing.  She also notes that he has had increasing lower extremity edema and increasing abdominal girth over the past month.  In the ED, the patient was afebrile and hemodynamically stable with oxygen  saturation 92-96% on room air.  WBC 6.6, hemoglobin 11.1, platelets 160.  Sodium 138, potassium 4.3, bicarbonate 24, serum creatinine 2.62.  LFTs were unremarkable.  Chest x-ray showed bilateral interstitial opacities.  The patient was started on ceftriaxone  and azithromycin .   Assessment/Plan: Acute HFpEF -continue IV lasix  -remains clinically fluid overloaded -daily weights -accurate I/Os - 8/8 Echo EF 65-70%, no WMA, G1DD, normal RVF   Aspiration pneumonia -speech therapy eval>>dys 2 with thin -continue ceftriaxone  and azithro for now - Check viral respiratory  panel--negative   Acute metabolic encephalopathy - At baseline, the patient is &O x 2, conversant, but needs some assistance with ADLs - 8/9--somnolent but awakens and answers basic questions - Secondary to ADHF and infectious process - UA negative for pyuria - B12--237>>replete, give IM inj - TSH--3.003 - folate--34.6 - CT brain - VBG - ammonia   Diabetes mellitus type 2 with hypoglycemia - Spouse states that she has only been giving long-acting insulin  at home 3-4 times a week because of frequent lows - 08/14/2023 hemoglobin A1c 7.5 - NovoLog  sliding scale - Holding Semglee for now   CKD stage IV - Baseline creatinine 2.3-2.6   Essential hypertension - Continue carvedilol  at lower dose - Holding amlodipine  to allow BP margin for diuresis   Mixed hyperlipidemia - Continue statin   Major neurocognitive disorder - Continue Aricept  and Namenda    GERD - Continue pantoprazole    Class 2 obesity -BMI 38.78 -lifestyle modification    Hypomagnesemia -replete             Family Communication:   wife updated 8/9   Consultants:  none   Code Status:  FULL   DVT Prophylaxis:  [place] Heparin       Procedures: As Listed in Progress Note Above   Antibiotics: Ceftriaxone  8/7>> Azithro 8/7>>        Subjective: Pt somnolent, awakens to answer basic questions.  Denies cp, sob, abd pain.  Remainder ROS not obtainable  Objective: Vitals:   09/16/23 2100 09/16/23 2133 09/17/23 0610 09/17/23 1204  BP:  (!) 144/72 (!) 158/83 122/70  Pulse:  (!) 58 74 75  Resp: 15  20 20 19   Temp:  98.2 F (36.8 C) 99.1 F (37.3 C) 98.9 F (37.2 C)  TempSrc:  Oral Oral Oral  SpO2:  94% 93% 96%  Weight:   106.1 kg   Height:        Intake/Output Summary (Last 24 hours) at 09/17/2023 1552 Last data filed at 09/17/2023 1400 Gross per 24 hour  Intake 460 ml  Output 1925 ml  Net -1465 ml   Weight change: -6.211 kg Exam:  General:  Pt is alert, follows commands appropriately, not  in acute distress HEENT: No icterus, No thrush, No neck mass, [place]/AT Cardiovascular: RRR, S1/S2, no rubs, no gallops Respiratory: fine bibasilar rales.  No wheeze Abdomen: Soft/+BS, non tender, non distended, no guarding Extremities: trac EL edema, No lymphangitis, No petechiae, No rashes, no synovitis   Data Reviewed: I have personally reviewed following labs and imaging studies Basic Metabolic Panel: Recent Labs  Lab 09/15/23 1045 09/16/23 1136 09/17/23 0432  NA 138 140 139  K 4.3 4.2 4.1  CL 102 101 102  CO2 24 26 26   GLUCOSE 110* 146* 108*  BUN 30* 29* 33*  CREATININE 2.62* 2.46* 2.60*  CALCIUM  8.7* 9.4 9.0  MG  --  1.6* 1.6*   Liver Function Tests: Recent Labs  Lab 09/15/23 1045  AST 15  ALT 13  ALKPHOS 104  BILITOT 1.0  PROT 7.5  ALBUMIN 2.9*   No results for input(s): LIPASE, AMYLASE in the last 168 hours. No results for input(s): AMMONIA in the last 168 hours. Coagulation Profile: No results for input(s): INR, PROTIME in the last 168 hours. CBC: Recent Labs  Lab 09/15/23 1045  WBC 6.6  NEUTROABS 4.3  HGB 11.1*  HCT 35.9*  MCV 88.0  PLT 160   Cardiac Enzymes: No results for input(s): CKTOTAL, CKMB, CKMBINDEX, TROPONINI in the last 168 hours. BNP: Invalid input(s): POCBNP CBG: Recent Labs  Lab 09/16/23 1108 09/16/23 1615 09/16/23 2135 09/17/23 0720 09/17/23 1108  GLUCAP 145* 97 165* 122* 157*   HbA1C: No results for input(s): HGBA1C in the last 72 hours. Urine analysis:    Component Value Date/Time   COLORURINE YELLOW 09/15/2023 1025   APPEARANCEUR CLEAR 09/15/2023 1025   LABSPEC 1.013 09/15/2023 1025   PHURINE 6.0 09/15/2023 1025   GLUCOSEU NEGATIVE 09/15/2023 1025   HGBUR NEGATIVE 09/15/2023 1025   BILIRUBINUR NEGATIVE 09/15/2023 1025   KETONESUR NEGATIVE 09/15/2023 1025   PROTEINUR NEGATIVE 09/15/2023 1025   NITRITE NEGATIVE 09/15/2023 1025   LEUKOCYTESUR NEGATIVE 09/15/2023 1025   Sepsis  Labs: @LABRCNTIP (procalcitonin:4,lacticidven:4) ) Recent Results (from the past 240 hours)  Resp panel by RT-PCR (RSV, Flu A&B, Covid) Anterior Nasal Swab     Status: None   Collection Time: 09/15/23  3:33 PM   Specimen: Anterior Nasal Swab  Result Value Ref Range Status   SARS Coronavirus 2 by RT PCR NEGATIVE NEGATIVE Final    Comment: (NOTE) SARS-CoV-2 target nucleic acids are NOT DETECTED.  The SARS-CoV-2 RNA is generally detectable in upper respiratory specimens during the acute phase of infection. The lowest concentration of SARS-CoV-2 viral copies this assay can detect is 138 copies/mL. A negative result does not preclude SARS-Cov-2 infection and should not be used as the sole basis for treatment or other patient management decisions. A negative result may occur with  improper specimen collection/handling, submission of specimen other than nasopharyngeal swab, presence of viral mutation(s) within the areas targeted by this assay, and inadequate number of viral copies(<138 copies/mL). A negative  result must be combined with clinical observations, patient history, and epidemiological information. The expected result is Negative.  Fact Sheet for Patients:  BloggerCourse.com  Fact Sheet for Healthcare Providers:  SeriousBroker.it  This test is no t yet approved or cleared by the [place]  FDA and  has been authorized for detection and/or diagnosis of SARS-CoV-2 by FDA under an Emergency Use Authorization (EUA). This EUA will remain  in effect (meaning this test can be used) for the duration of the COVID-19 declaration under Section 564(b)(1) of the Act, 21 U.S.C.section 360bbb-3(b)(1), unless the authorization is terminated  or revoked sooner.       Influenza A by PCR NEGATIVE NEGATIVE Final   Influenza B by PCR NEGATIVE NEGATIVE Final    Comment: (NOTE) The Xpert Xpress SARS-CoV-2/FLU/RSV plus assay is intended as an  aid in the diagnosis of influenza from Nasopharyngeal swab specimens and should not be used as a sole basis for treatment. Nasal washings and aspirates are unacceptable for Xpert Xpress SARS-CoV-2/FLU/RSV testing.  Fact Sheet for Patients: BloggerCourse.com  Fact Sheet for Healthcare Providers: SeriousBroker.it  This test is not yet approved or cleared by the [place]  FDA and has been authorized for detection and/or diagnosis of SARS-CoV-2 by FDA under an Emergency Use Authorization (EUA). This EUA will remain in effect (meaning this test can be used) for the duration of the COVID-19 declaration under Section 564(b)(1) of the Act, 21 U.S.C. section 360bbb-3(b)(1), unless the authorization is terminated or revoked.     Resp Syncytial Virus by PCR NEGATIVE NEGATIVE Final    Comment: (NOTE) Fact Sheet for Patients: BloggerCourse.com  Fact Sheet for Healthcare Providers: SeriousBroker.it  This test is not yet approved or cleared by the [place]  FDA and has been authorized for detection and/or diagnosis of SARS-CoV-2 by FDA under an Emergency Use Authorization (EUA). This EUA will remain in effect (meaning this test can be used) for the duration of the COVID-19 declaration under Section 564(b)(1) of the Act, 21 U.S.C. section 360bbb-3(b)(1), unless the authorization is terminated or revoked.  Performed at [facility], [address] [place], [place] [address]   Respiratory (~20 pathogens) panel by PCR     Status: None   Collection Time: 09/15/23  3:33 PM   Specimen: Nasopharyngeal Swab; Respiratory  Result Value Ref Range Status   Adenovirus NOT DETECTED NOT DETECTED Final   Coronavirus 229E NOT DETECTED NOT DETECTED Final    Comment: (NOTE) The Coronavirus on the Respiratory Panel, DOES NOT test for the novel  Coronavirus (2019 nCoV)    Coronavirus HKU1 NOT  DETECTED NOT DETECTED Final   Coronavirus NL63 NOT DETECTED NOT DETECTED Final   Coronavirus OC43 NOT DETECTED NOT DETECTED Final   Metapneumovirus NOT DETECTED NOT DETECTED Final   Rhinovirus / Enterovirus NOT DETECTED NOT DETECTED Final   Influenza A NOT DETECTED NOT DETECTED Final   Influenza B NOT DETECTED NOT DETECTED Final   Parainfluenza Virus 1 NOT DETECTED NOT DETECTED Final   Parainfluenza Virus 2 NOT DETECTED NOT DETECTED Final   Parainfluenza Virus 3 NOT DETECTED NOT DETECTED Final   Parainfluenza Virus 4 NOT DETECTED NOT DETECTED Final   Respiratory Syncytial Virus NOT DETECTED NOT DETECTED Final   Bordetella pertussis NOT DETECTED NOT DETECTED Final   Bordetella Parapertussis NOT DETECTED NOT DETECTED Final   Chlamydophila pneumoniae NOT DETECTED NOT DETECTED Final   Mycoplasma pneumoniae NOT DETECTED NOT DETECTED Final    Comment: Performed at [facility], [address], [place], [place] [address]     Scheduled Meds:  allopurinol   100 mg Oral Daily   aspirin  EC  81 mg Oral Daily   atorvastatin   20 mg Oral q1800   carvedilol   3.125 mg Oral BID WC   cyanocobalamin   1,000 mcg Intramuscular Once   donepezil   10 mg Oral QPM   folic acid   1 mg Oral Daily   furosemide   40 mg Intravenous Daily   heparin   5,000 Units Subcutaneous Q8H   insulin  aspart  0-5 Units Subcutaneous QHS   insulin  aspart  0-9 Units Subcutaneous TID WC   memantine   10 mg Oral BID   omega-3 acid ethyl esters  1 g Oral BID   pantoprazole   40 mg Oral BID   Continuous Infusions:  azithromycin  500 mg (09/17/23 1338)   cefTRIAXone  (ROCEPHIN )  IV 1 g (09/17/23 1237)   magnesium  sulfate bolus IVPB      Procedures/Studies: ECHOCARDIOGRAM COMPLETE Result Date: 09/16/2023    ECHOCARDIOGRAM REPORT   Patient Name:   Orvel Matarese Date of Exam: 09/16/2023 Medical Rec #:  [id number]     Height:       67.0 in Accession #:    [id number]    Weight:       247.6 lb Date of Birth:  Jun 12, 1945     BSA:           2.214 m Patient Age:    78 years      BP:           119/80 mmHg Patient Gender: M             HR:           51 bpm. Exam Location:  [facility] Procedure: 2D Echo, Cardiac Doppler and Color Doppler (Both Spectral and Color            Flow Doppler were utilized during procedure). Indications:    CHF-Acute Diastolic I50.31  History:        Patient has no prior history of Echocardiogram examinations.                 Risk Factors:Hypertension and Diabetes.  Sonographer:    Jayson Gaskins Referring Phys: [phone] Kelleen Stolze  Sonographer Comments: Image acquisition challenging due to uncooperative patient. IMPRESSIONS  1. Left ventricular ejection fraction, by estimation, is 65 to 70%. The left ventricle has normal function. Left ventricular endocardial border not optimally defined to evaluate regional wall motion. Left ventricular diastolic parameters are consistent with Grade I diastolic dysfunction (impaired relaxation).  2. Right ventricular systolic function is normal. The right ventricular size is normal. Tricuspid regurgitation signal is inadequate for assessing PA pressure.  3. The mitral valve is normal in structure. No evidence of mitral valve regurgitation. No evidence of mitral stenosis.  4. The aortic valve was not well visualized. Aortic valve regurgitation is not visualized. No aortic stenosis is present. FINDINGS  Left Ventricle: Left ventricular ejection fraction, by estimation, is 65 to 70%. The left ventricle has normal function. Left ventricular endocardial border not optimally defined to evaluate regional wall motion. The left ventricular internal cavity size was normal in size. There is no left ventricular hypertrophy. Left ventricular diastolic parameters are consistent with Grade I diastolic dysfunction (impaired relaxation). Normal left ventricular filling pressure. Right Ventricle: The right ventricular size is normal. Right vetricular wall thickness was not well visualized. Right ventricular systolic  function is normal. Tricuspid regurgitation signal is inadequate for assessing PA pressure.  Left Atrium: Left atrial size was normal in size. Right Atrium: Right atrial size was normal in size. Pericardium: There is no evidence of pericardial effusion. Mitral Valve: The mitral valve is normal in structure. No evidence of mitral valve regurgitation. No evidence of mitral valve stenosis. Tricuspid Valve: The tricuspid valve is normal in structure. Tricuspid valve regurgitation is not demonstrated. No evidence of tricuspid stenosis. Aortic Valve: The aortic valve was not well visualized. Aortic valve regurgitation is not visualized. No aortic stenosis is present. Aortic valve mean gradient measures 2.0 mmHg. Aortic valve peak gradient measures 3.5 mmHg. Aortic valve area, by VTI measures 3.67 cm. Pulmonic Valve: The pulmonic valve was not well visualized. Pulmonic valve regurgitation is not visualized. No evidence of pulmonic stenosis. Aorta: The aortic root is normal in size and structure and the ascending aorta was not well visualized. Venous: The inferior vena cava was not well visualized. IAS/Shunts: The interatrial septum was not well visualized.  LEFT VENTRICLE PLAX 2D LVIDd:         4.70 cm   Diastology LVIDs:         2.90 cm   LV e' medial:    4.79 cm/s LV PW:         1.20 cm   LV E/e' medial:  10.4 LV IVS:        1.00 cm   LV e' lateral:   5.87 cm/s LVOT diam:     2.00 cm   LV E/e' lateral: 8.5 LV SV:         77 LV SV Index:   35 LVOT Area:     3.14 cm  RIGHT VENTRICLE RV S prime:     16.10 cm/s TAPSE (M-mode): 2.7 cm LEFT ATRIUM           Index        RIGHT ATRIUM           Index LA Vol (A2C): 15.9 ml 7.18 ml/m   RA Area:     17.80 cm LA Vol (A4C): 53.9 ml 24.34 ml/m  RA Volume:   45.70 ml  20.64 ml/m  AORTIC VALVE AV Area (Vmax):    3.16 cm AV Area (Vmean):   3.01 cm AV Area (VTI):     3.67 cm AV Vmax:           92.90 cm/s AV Vmean:          71.300 cm/s AV VTI:            0.210 m AV Peak Grad:       3.5 mmHg AV Mean Grad:      2.0 mmHg LVOT Vmax:         93.40 cm/s LVOT Vmean:        68.400 cm/s LVOT VTI:          0.245 m LVOT/AV VTI ratio: 1.17  AORTA Ao Root diam: 3.20 cm MITRAL VALVE MV Area (PHT): 1.82 cm    SHUNTS MV Decel Time: 417 msec    Systemic VTI:  0.24 m MV E velocity: 49.90 cm/s  Systemic Diam: 2.00 cm MV A velocity: 70.00 cm/s MV E/A ratio:  0.71 Dorn Ross MD Electronically signed by Dorn Ross MD Signature Date/Time: 09/16/2023/9:33:15 AM    Final    DG Chest 2 View Result Date: 09/15/2023 CLINICAL DATA:  cough EXAM: CHEST - 2 VIEW COMPARISON:  February 20, 2023 FINDINGS: Bilateral perihilar interstitial opacities. No lobar consolidation, pleural effusion, or pneumothorax. Mild  cardiomegaly. Tortuous aorta with aortic atherosclerosis. No acute fracture or destructive lesions. Multilevel thoracic osteophytosis. IMPRESSION: Bilateral perihilar interstitial opacities, possibly reflective of an atypical/viral infection or interstitial edema. Electronically Signed   By: Rogelia Myers M.D.   On: 09/15/2023 12:01    Alm Schneider, DO  [facility]  If 7PM-7AM, please contact night-coverage [email or web address] Password [email or web address] 09/17/2023, 3:52 PM   LOS: 1 day

## 2023-09-17 NOTE — Plan of Care (Signed)
   Problem: Education: Goal: Knowledge of General Education information will improve Description: Including pain rating scale, medication(s)/side effects and non-pharmacologic comfort measures Outcome: Not Progressing   Problem: Health Behavior/Discharge Planning: Goal: Ability to manage health-related needs will improve Outcome: Not Progressing

## 2023-09-18 DIAGNOSIS — N184 Chronic kidney disease, stage 4 (severe): Secondary | ICD-10-CM | POA: Diagnosis not present

## 2023-09-18 DIAGNOSIS — E66812 Obesity, class 2: Secondary | ICD-10-CM | POA: Diagnosis not present

## 2023-09-18 DIAGNOSIS — I5031 Acute diastolic (congestive) heart failure: Secondary | ICD-10-CM | POA: Diagnosis not present

## 2023-09-18 DIAGNOSIS — G9341 Metabolic encephalopathy: Secondary | ICD-10-CM | POA: Diagnosis not present

## 2023-09-18 LAB — URINALYSIS, W/ REFLEX TO CULTURE (INFECTION SUSPECTED)
Bacteria, UA: NONE SEEN
Bilirubin Urine: NEGATIVE
Glucose, UA: NEGATIVE mg/dL
Hgb urine dipstick: NEGATIVE
Ketones, ur: NEGATIVE mg/dL
Leukocytes,Ua: NEGATIVE
Nitrite: NEGATIVE
Protein, ur: NEGATIVE mg/dL
Specific Gravity, Urine: 1.016 (ref 1.005–1.030)
pH: 6 (ref 5.0–8.0)

## 2023-09-18 LAB — BASIC METABOLIC PANEL WITH GFR
Anion gap: 10 (ref 5–15)
BUN: 43 mg/dL — ABNORMAL HIGH (ref 8–23)
CO2: 23 mmol/L (ref 22–32)
Calcium: 8.6 mg/dL — ABNORMAL LOW (ref 8.9–10.3)
Chloride: 101 mmol/L (ref 98–111)
Creatinine, Ser: 2.9 mg/dL — ABNORMAL HIGH (ref 0.61–1.24)
GFR, Estimated: 22 mL/min — ABNORMAL LOW (ref >60)
Glucose, Bld: 135 mg/dL — ABNORMAL HIGH (ref 70–99)
Potassium: 4.3 mmol/L (ref 3.5–5.1)
Sodium: 134 mmol/L — ABNORMAL LOW (ref 135–145)

## 2023-09-18 LAB — BLOOD GAS, VENOUS
Acid-Base Excess: 7.2 mmol/L — ABNORMAL HIGH (ref 0.0–2.0)
Bicarbonate: 32.6 mmol/L — ABNORMAL HIGH (ref 20.0–28.0)
Drawn by: 7049
O2 Saturation: 90.8 %
Patient temperature: 36.8
pCO2, Ven: 48 mmHg (ref 44–60)
pH, Ven: 7.44 — ABNORMAL HIGH (ref 7.25–7.43)
pO2, Ven: 60 mmHg — ABNORMAL HIGH (ref 32–45)

## 2023-09-18 LAB — GLUCOSE, CAPILLARY
Glucose-Capillary: 126 mg/dL — ABNORMAL HIGH (ref 70–99)
Glucose-Capillary: 189 mg/dL — ABNORMAL HIGH (ref 70–99)

## 2023-09-18 LAB — MAGNESIUM: Magnesium: 2.3 mg/dL (ref 1.7–2.4)

## 2023-09-18 MED ORDER — AZITHROMYCIN 500 MG PO TABS: 500.0000 mg | ORAL_TABLET | Freq: Every day | ORAL | 0 refills | Status: DC

## 2023-09-18 MED ORDER — CYANOCOBALAMIN 500 MCG PO TABS: 500.0000 ug | ORAL_TABLET | Freq: Every day | ORAL | Status: AC

## 2023-09-18 MED ORDER — CARVEDILOL 6.25 MG PO TABS: 6.2500 mg | ORAL_TABLET | Freq: Two times a day (BID) | ORAL | 1 refills | Status: DC

## 2023-09-18 MED ORDER — CEFDINIR 300 MG PO CAPS: 300.0000 mg | ORAL_CAPSULE | Freq: Two times a day (BID) | ORAL | 0 refills | Status: DC

## 2023-09-18 MED ORDER — AZITHROMYCIN 250 MG PO TABS
500.0000 mg | ORAL_TABLET | Freq: Every day | ORAL | Status: DC
  Administered 2023-09-18: 500 mg via ORAL
  Filled 2023-09-18: qty 2

## 2023-09-18 NOTE — Evaluation (Signed)
 Physical Therapy Evaluation Patient Details Name: Terry Stevens MRN: [id number] DOB: 08/03/1945 Today's Date: 09/18/2023  History of Present Illness  PEr Fi:Terry Stevens is a 78 year old male with a history of dementia, cardiac arrest, hypertension, diabetes mellitus type 2, CKD stage IV, GERD, and dysphagia presenting with 2-week history of altered mental status.  Wife also states that he has had increasing cough, chest congestion, and generalized weakness.  He has not had any fevers, chills, headache, neck pain, nausea, vomiting, diarrhea.  She states that he has been increasingly agitated over the past 1 to 2 weeks.  He has had some dyspnea on exertion.  There is not been any chest pain or abdominal pain or hemoptysis.     Notably, the patient had a recent hospital admission from 08/14/2023 to 08/17/2023 due to intractable nausea and vomiting.  He was felt that there was a combination of his gastroparesis, GERD, and hiatal hernia.  At the time of discharge, the patient was instructed to stop his daily furosemide  dosing until 08/22/2023 which the patient's spouse states she has resumed.  Clinical Impression  PT states he normally does not use a RW but at this point is not steady without one.  PT has RW from [facility].  Pt will benefit from [facility] PT to improve activity level and balance.         If plan is discharge home, recommend the following: A little help with walking and/or transfers;A little help with bathing/dressing/bathroom;Assistance with cooking/housework;Assist for transportation;Help with stairs or ramp for entrance;Direct supervision/assist for medications management   Can travel by private vehicle    yes    Equipment Recommendations  none  Recommendations for Other Services   none    Functional Status Assessment Patient has had a recent decline in their functional status and demonstrates the ability to make significant improvements in function in a reasonable and predictable amount of time.      Precautions / Restrictions Precautions Precautions: Fall Restrictions Weight Bearing Restrictions Per Provider Order: No      Mobility  Bed Mobility Overal bed mobility: Modified Independent                  Transfers Overall transfer level: Needs assistance   Transfers: Sit to/from Stand Sit to Stand: Contact guard assist                Ambulation/Gait Ambulation/Gait assistance: Contact guard assist Gait Distance (Feet): 160 Feet Assistive device: Rolling walker (2 wheels) Gait Pattern/deviations: Decreased step length - right, Decreased step length - left   Gait velocity interpretation: <1.8 ft/sec, indicate of risk for recurrent falls           Pertinent Vitals/Pain Pain Assessment Pain Assessment: No/denies pain    Home Living Family/patient expects to be discharged to:: Private residence Living Arrangements: Spouse/significant other Available Help at Discharge: Available 24 hours/day;Family Type of Home: House Home Access: Ramped entrance       Home Layout: One level Home Equipment: Agricultural consultant (2 wheels);Cane - single point;BSC/3in1;Shower seat      Prior Function Prior Level of Function : Needs assist  Cognitive Assist : Mobility (cognitive);ADLs (cognitive) Mobility (Cognitive): Intermittent cues ADLs (Cognitive): Intermittent cues               Extremity/Trunk Assessment        Lower Extremity Assessment Lower Extremity Assessment: Overall WFL for tasks assessed       Communication   Communication Communication: No apparent difficulties  Cognition Arousal: Alert Behavior During Therapy: WFL for tasks assessed/performed   PT - Cognitive impairments: History of cognitive impairments                         Following commands: Impaired Following commands impaired: Follows one step commands inconsistently     Cueing Cueing Techniques: Visual cues, Gestural cues, Verbal cues, Tactile cues             Assessment/Plan    PT Assessment Patient needs continued PT services  PT Problem List Decreased strength;Decreased activity tolerance;Decreased balance       PT Treatment Interventions Functional mobility training;Gait training;Therapeutic exercise    PT Goals (Current goals can be found in the Care Plan section)  Acute Rehab PT Goals Patient Stated Goal: Pt is being discharged with wife. PT Goal Formulation: With patient    Frequency Min 3X/week        AM-PAC PT 6 Clicks Mobility  Outcome Measure Help needed turning from your back to your side while in a flat bed without using bedrails?: A Little Help needed moving from lying on your back to sitting on the side of a flat bed without using bedrails?: A Little Help needed moving to and from a bed to a chair (including a wheelchair)?: A Little Help needed standing up from a chair using your arms (e.g., wheelchair or bedside chair)?: A Little Help needed to walk in hospital room?: A Little Help needed climbing 3-5 steps with a railing? : A Lot 6 Click Score: 17    End of Session Equipment Utilized During Treatment: Gait belt Activity Tolerance: Patient tolerated treatment well Patient left: in chair;with call bell/phone within reach Nurse Communication: Mobility status PT Visit Diagnosis: Unsteadiness on feet (R26.81);Muscle weakness (generalized) (M62.81)    Time: 8799-8764 PT Time Calculation (min) (ACUTE ONLY): 35 min   Charges:   PT Evaluation $PT Eval Low Complexity: 1 Low PT Treatments $Therapeutic Activity: 8-22 mins PT General Charges $$ ACUTE PT VISIT: 1 Visit          Montie Metro, PT CLT [phone]  09/18/2023, 12:42 PM

## 2023-09-18 NOTE — Progress Notes (Signed)
 IV removed and DC instructions reviewed.  To follow up with primary tomorrow.  Scripts sent to pharmacy.  Transported by WC to main entrance.  Wife to drive home

## 2023-09-18 NOTE — Discharge Summary (Signed)
 Physician Discharge Summary   Patient: Terry Stevens MRN: [id number] DOB: 01/28/1946  Admit date:     09/15/2023  Discharge date: 09/18/23  Discharge Physician: Alm Nea Gittens   PCP: Orpha Yancey LABOR, MD   Recommendations at discharge:   Please follow up with primary care provider within 1-2 weeks  Please repeat BMP and CBC in one week   Discharge Diagnoses: Principal Problem:   Lobar pneumonia (HCC) Active Problems:   CKD (chronic kidney disease) stage 4, GFR 15-29 ml/min (HCC)   Acute CHF (HCC)   Aspiration pneumonitis (HCC)   Class 2 obesity   Acute heart failure with preserved ejection fraction (HFpEF) (HCC)   Metabolic encephalopathy  Resolved Problems:   * No resolved hospital problems. *  Hospital Course: 78 year old male with a history of dementia, cardiac arrest, hypertension, diabetes mellitus type 2, CKD stage IV, GERD, and dysphagia presenting with 2-week history of altered mental status.  Wife also states that he has had increasing cough, chest congestion, and generalized weakness.  He has not had any fevers, chills, headache, neck pain, nausea, vomiting, diarrhea.  She states that he has been increasingly agitated over the past 1 to 2 weeks.  He has had some dyspnea on exertion.  There is not been any chest pain or abdominal pain or hemoptysis.  Notably, the patient had a recent hospital admission from 08/14/2023 to 08/17/2023 due to intractable nausea and vomiting.  He was felt that there was a combination of his gastroparesis, GERD, and hiatal hernia.  At the time of discharge, the patient was instructed to stop his daily furosemide  dosing until 08/22/2023 which the patient's spouse states she has resumed.  The patient spouse also relates that he continues to have some intermittent difficulty with swallowing.  She also notes that he has had increasing lower extremity edema and increasing abdominal girth over the past month.  In the ED, the patient was afebrile and  hemodynamically stable with oxygen  saturation 92-96% on room air.  WBC 6.6, hemoglobin 11.1, platelets 160.  Sodium 138, potassium 4.3, bicarbonate 24, serum creatinine 2.62.  LFTs were unremarkable.  Chest x-ray showed bilateral interstitial opacities.  The patient was started on ceftriaxone  and azithromycin .  Assessment and Plan:  Acute HFpEF -continue IV lasix >>dc home with prior home lasix  dose -remains clinically fluid overloaded -daily weights 247>>234 lbs -accurate I/Os--incomplete - 8/8 Echo EF 65-70%, no WMA, G1DD, normal RVF - ReDS = 38   Aspiration pneumonia -speech therapy eval>>dys 2 with thin -continue ceftriaxone  and azithro for now - Check viral respiratory panel--negative - d/c home with azithro x 3 more days, cefdinir  x 4 days   Acute metabolic encephalopathy - At baseline, the patient is &O x 2, conversant, but needs some assistance with ADLs - 8/9--somnolent but awakens and answers basic questions - Secondary to ADHF and infectious process - UA negative for pyuria - B12--237>>replete, give IM inj x 1 and start po - TSH--3.003 - folate--34.6 - CT brain--neg - VBG--7.44/48/60/32 - ammonia--31 - 09/18/23 mental status improved, near baseline   Diabetes mellitus type 2 with hypoglycemia - Spouse states that she has only been giving long-acting insulin  at home 3-4 times a week because of frequent lows - 08/14/2023 hemoglobin A1c 7.5 - NovoLog  sliding scale - Holding Semglee for now - will not restart long acting insulin  as pt CBGs well controlled without it during hospitalization - instructed spouse to check CBGs and follow up with PCP   Acute on chronic renal failure-CKD stage IV -  Baseline creatinine 2.3-2.6 - serum creatinine up to 2.90 on day of d/c - hold lasix  one day and restart 8/12 po lasix  - repeat BMP one week after d/c   Essential hypertension - Continue carvedilol  at lower dose 6.25 mg bid - Initially Holding amlodipine  to allow BP margin for  diuresis - restart amlodipine  at time of dc - restart spironolactone   Mixed hyperlipidemia - Continue statin   Major neurocognitive disorder - Continue Aricept  and Namenda    GERD - Continue pantoprazole    Class 2 obesity -BMI 38.78 -lifestyle modification    Hypomagnesemia -repleted - mag 2.3 on 8/10       Consultants: none Procedures performed: nopne  Disposition: Home Diet recommendation:  Cardiac diet DISCHARGE MEDICATION: Allergies as of 09/18/2023       Reactions   Penicillins Other (See Comments)   Unsure of reaction per patient. Per pt's wife, not allergic        Medication List     STOP taking these medications    famotidine  20 MG tablet Commonly known as: PEPCID    metoCLOPramide  5 MG tablet Commonly known as: Reglan    traZODone  100 MG tablet Commonly known as: DESYREL    Tresiba FlexTouch 100 UNIT/ML FlexTouch Pen Generic drug: insulin  degludec       TAKE these medications    acetaminophen  325 MG tablet Commonly known as: TYLENOL  Take 2 tablets (650 mg total) by mouth every 6 (six) hours as needed for mild pain (pain score 1-3) or fever (or Fever >/= 101).   allopurinol  100 MG tablet Commonly known as: ZYLOPRIM  Take 1 tablet by mouth daily.   amLODipine  5 MG tablet Commonly known as: NORVASC  Take 5 mg by mouth daily.   aspirin  81 MG tablet Take 1 tablet (81 mg total) by mouth daily with breakfast.   atorvastatin  20 MG tablet Commonly known as: LIPITOR Take 20 mg by mouth daily at 6 PM.   azithromycin  500 MG tablet Commonly known as: ZITHROMAX  Take 1 tablet (500 mg total) by mouth daily.   carvedilol  6.25 MG tablet Commonly known as: COREG  Take 1 tablet (6.25 mg total) by mouth 2 (two) times daily with a meal. What changed:  medication strength how much to take   cefdinir  300 MG capsule Commonly known as: OMNICEF  Take 1 capsule (300 mg total) by mouth 2 (two) times daily.   cyanocobalamin  500 MCG tablet Commonly  known as: VITAMIN B12 Take 1 tablet (500 mcg total) by mouth daily. Start taking on: September 19, 2023   donepezil  10 MG tablet Commonly known as: ARICEPT  Take 10 mg by mouth every evening.   Fish Oil  1000 MG Caps Take 1,000 mg by mouth 2 (two) times daily.   folic acid  1 MG tablet Commonly known as: FOLVITE  Take 1 tablet by mouth daily.   furosemide  40 MG tablet Commonly known as: LASIX  Take 20 mg by mouth every morning.   memantine  10 MG tablet Commonly known as: NAMENDA  Take 10 mg by mouth 2 (two) times daily.   pantoprazole  40 MG tablet Commonly known as: Protonix  Take 1 tablet (40 mg total) by mouth 2 (two) times daily.   sodium bicarbonate  650 MG tablet Take 1 tablet (650 mg total) by mouth 2 (two) times daily.   spironolactone 25 MG tablet Commonly known as: ALDACTONE Take 25 mg by mouth daily.   terazosin  5 MG capsule Commonly known as: HYTRIN  Take 5 mg by mouth at bedtime.        Discharge  Exam: Filed Weights   09/15/23 1023 09/17/23 0610 09/18/23 0449  Weight: 112.3 kg 106.1 kg 106.2 kg   HEENT:  [place]/AT, No thrush, no icterus CV:  RRR, no rub, no S3, no S4 Lung:  fine bibasilar rales.  No wheeze Abd:  soft/+BS, NT Ext:  No edema, no lymphangitis, no synovitis, no rash   Condition at discharge: stable  The results of significant diagnostics from this hospitalization (including imaging, microbiology, ancillary and laboratory) are listed below for reference.   Imaging Studies: CT HEAD WO CONTRAST ( ) Result Date: 09/18/2023 CLINICAL DATA:  Initial evaluation for mental status change, unknown cause. EXAM: CT HEAD WITHOUT CONTRAST TECHNIQUE: Contiguous axial images were obtained from the base of the skull through the vertex without intravenous contrast. RADIATION DOSE REDUCTION: This exam was performed according to the departmental dose-optimization program which includes automated exposure control, adjustment of the mA and/or kV according to patient  size and/or use of iterative reconstruction technique. COMPARISON:  Prior study from 09/27/2015. FINDINGS: Brain: Diffuse prominence of the CSF containing spaces compatible generalized cerebral atrophy. Patchy and confluent hypodensity involving the supratentorial cerebral white matter, consistent chronic small vessel ischemic disease. Changes are moderately advanced in nature, and progressed from prior. No acute intracranial hemorrhage. No acute large vessel territory infarct. No mass lesion or midline shift. Ventricular prominence related global parenchymal volume loss without hydrocephalus. No extra-axial fluid collection. Vascular: No abnormal hyperdense vessel. Calcified atherosclerosis present at the skull base. Skull: Scalp soft tissues within normal limits.  Calvarium intact. Sinuses/Orbits: Globes orbital soft tissues within normal limits. Paranasal sinuses are largely clear. No significant mastoid effusion. Other: None. IMPRESSION: 1. No acute intracranial abnormality. 2. Moderately advanced cerebral atrophy with chronic small vessel ischemic disease, progressed from prior. Electronically Signed   By: Morene Hoard M.D.   On: 09/18/2023 00:16   ECHOCARDIOGRAM COMPLETE Result Date: 09/16/2023    ECHOCARDIOGRAM REPORT   Patient Name:   Ranferi Reineck Date of Exam: 09/16/2023 Medical Rec #:  [id number]     Height:       67.0 in Accession #:    [id number]    Weight:       247.6 lb Date of Birth:  [birth year]     BSA:          2.214 m Patient Age:    78 years      BP:           119/80 mmHg Patient Gender: M             HR:           51 bpm. Exam Location:  [facility] Procedure: 2D Echo, Cardiac Doppler and Color Doppler (Both Spectral and Color            Flow Doppler were utilized during procedure). Indications:    CHF-Acute Diastolic I50.31  History:        Patient has no prior history of Echocardiogram examinations.                 Risk Factors:Hypertension and Diabetes.  Sonographer:    Jayson Gaskins  Referring Phys: [phone] Jesten Cappuccio  Sonographer Comments: Image acquisition challenging due to uncooperative patient. IMPRESSIONS  1. Left ventricular ejection fraction, by estimation, is 65 to 70%. The left ventricle has normal function. Left ventricular endocardial border not optimally defined to evaluate regional wall motion. Left ventricular diastolic parameters are consistent with Grade I diastolic dysfunction (impaired relaxation).  2. Right ventricular systolic function  is normal. The right ventricular size is normal. Tricuspid regurgitation signal is inadequate for assessing PA pressure.  3. The mitral valve is normal in structure. No evidence of mitral valve regurgitation. No evidence of mitral stenosis.  4. The aortic valve was not well visualized. Aortic valve regurgitation is not visualized. No aortic stenosis is present. FINDINGS  Left Ventricle: Left ventricular ejection fraction, by estimation, is 65 to 70%. The left ventricle has normal function. Left ventricular endocardial border not optimally defined to evaluate regional wall motion. The left ventricular internal cavity size was normal in size. There is no left ventricular hypertrophy. Left ventricular diastolic parameters are consistent with Grade I diastolic dysfunction (impaired relaxation). Normal left ventricular filling pressure. Right Ventricle: The right ventricular size is normal. Right vetricular wall thickness was not well visualized. Right ventricular systolic function is normal. Tricuspid regurgitation signal is inadequate for assessing PA pressure. Left Atrium: Left atrial size was normal in size. Right Atrium: Right atrial size was normal in size. Pericardium: There is no evidence of pericardial effusion. Mitral Valve: The mitral valve is normal in structure. No evidence of mitral valve regurgitation. No evidence of mitral valve stenosis. Tricuspid Valve: The tricuspid valve is normal in structure. Tricuspid valve regurgitation is not  demonstrated. No evidence of tricuspid stenosis. Aortic Valve: The aortic valve was not well visualized. Aortic valve regurgitation is not visualized. No aortic stenosis is present. Aortic valve mean gradient measures 2.0 mmHg. Aortic valve peak gradient measures 3.5 mmHg. Aortic valve area, by VTI measures 3.67 cm. Pulmonic Valve: The pulmonic valve was not well visualized. Pulmonic valve regurgitation is not visualized. No evidence of pulmonic stenosis. Aorta: The aortic root is normal in size and structure and the ascending aorta was not well visualized. Venous: The inferior vena cava was not well visualized. IAS/Shunts: The interatrial septum was not well visualized.  LEFT VENTRICLE PLAX 2D LVIDd:         4.70 cm   Diastology LVIDs:         2.90 cm   LV e' medial:    4.79 cm/s LV PW:         1.20 cm   LV E/e' medial:  10.4 LV IVS:        1.00 cm   LV e' lateral:   5.87 cm/s LVOT diam:     2.00 cm   LV E/e' lateral: 8.5 LV SV:         77 LV SV Index:   35 LVOT Area:     3.14 cm  RIGHT VENTRICLE RV S prime:     16.10 cm/s TAPSE (M-mode): 2.7 cm LEFT ATRIUM           Index        RIGHT ATRIUM           Index LA Vol (A2C): 15.9 ml 7.18 ml/m   RA Area:     17.80 cm LA Vol (A4C): 53.9 ml 24.34 ml/m  RA Volume:   45.70 ml  20.64 ml/m  AORTIC VALVE AV Area (Vmax):    3.16 cm AV Area (Vmean):   3.01 cm AV Area (VTI):     3.67 cm AV Vmax:           92.90 cm/s AV Vmean:          71.300 cm/s AV VTI:            0.210 m AV Peak Grad:      3.5 mmHg AV  Mean Grad:      2.0 mmHg LVOT Vmax:         93.40 cm/s LVOT Vmean:        68.400 cm/s LVOT VTI:          0.245 m LVOT/AV VTI ratio: 1.17  AORTA Ao Root diam: 3.20 cm MITRAL VALVE MV Area (PHT): 1.82 cm    SHUNTS MV Decel Time: 417 msec    Systemic VTI:  0.24 m MV E velocity: 49.90 cm/s  Systemic Diam: 2.00 cm MV A velocity: 70.00 cm/s MV E/A ratio:  0.71 Dorn Ross MD Electronically signed by Dorn Ross MD Signature Date/Time: 09/16/2023/9:33:15 AM    Final     DG Chest 2 View Result Date: 09/15/2023 CLINICAL DATA:  cough EXAM: CHEST - 2 VIEW COMPARISON:  February 20, 2023 FINDINGS: Bilateral perihilar interstitial opacities. No lobar consolidation, pleural effusion, or pneumothorax. Mild cardiomegaly. Tortuous aorta with aortic atherosclerosis. No acute fracture or destructive lesions. Multilevel thoracic osteophytosis. IMPRESSION: Bilateral perihilar interstitial opacities, possibly reflective of an atypical/viral infection or interstitial edema. Electronically Signed   By: Rogelia Myers M.D.   On: 09/15/2023 12:01    Microbiology: Results for orders placed or performed during the hospital encounter of 09/15/23  Resp panel by RT-PCR (RSV, Flu A&B, Covid) Anterior Nasal Swab     Status: None   Collection Time: 09/15/23  3:33 PM   Specimen: Anterior Nasal Swab  Result Value Ref Range Status   SARS Coronavirus 2 by RT PCR NEGATIVE NEGATIVE Final    Comment: (NOTE) SARS-CoV-2 target nucleic acids are NOT DETECTED.  The SARS-CoV-2 RNA is generally detectable in upper respiratory specimens during the acute phase of infection. The lowest concentration of SARS-CoV-2 viral copies this assay can detect is 138 copies/mL. A negative result does not preclude SARS-Cov-2 infection and should not be used as the sole basis for treatment or other patient management decisions. A negative result may occur with  improper specimen collection/handling, submission of specimen other than nasopharyngeal swab, presence of viral mutation(s) within the areas targeted by this assay, and inadequate number of viral copies(<138 copies/mL). A negative result must be combined with clinical observations, patient history, and epidemiological information. The expected result is Negative.  Fact Sheet for Patients:  BloggerCourse.com  Fact Sheet for Healthcare Providers:  SeriousBroker.it  This test is no t yet approved or  cleared by the [place]  FDA and  has been authorized for detection and/or diagnosis of SARS-CoV-2 by FDA under an Emergency Use Authorization (EUA). This EUA will remain  in effect (meaning this test can be used) for the duration of the COVID-19 declaration under Section 564(b)(1) of the Act, 21 U.S.C.section 360bbb-3(b)(1), unless the authorization is terminated  or revoked sooner.       Influenza A by PCR NEGATIVE NEGATIVE Final   Influenza B by PCR NEGATIVE NEGATIVE Final    Comment: (NOTE) The Xpert Xpress SARS-CoV-2/FLU/RSV plus assay is intended as an aid in the diagnosis of influenza from Nasopharyngeal swab specimens and should not be used as a sole basis for treatment. Nasal washings and aspirates are unacceptable for Xpert Xpress SARS-CoV-2/FLU/RSV testing.  Fact Sheet for Patients: BloggerCourse.com  Fact Sheet for Healthcare Providers: SeriousBroker.it  This test is not yet approved or cleared by the [place]  FDA and has been authorized for detection and/or diagnosis of SARS-CoV-2 by FDA under an Emergency Use Authorization (EUA). This EUA will remain in effect (meaning this test can be used) for the  duration of the COVID-19 declaration under Section 564(b)(1) of the Act, 21 U.S.C. section 360bbb-3(b)(1), unless the authorization is terminated or revoked.     Resp Syncytial Virus by PCR NEGATIVE NEGATIVE Final    Comment: (NOTE) Fact Sheet for Patients: BloggerCourse.com  Fact Sheet for Healthcare Providers: SeriousBroker.it  This test is not yet approved or cleared by the [place]  FDA and has been authorized for detection and/or diagnosis of SARS-CoV-2 by FDA under an Emergency Use Authorization (EUA). This EUA will remain in effect (meaning this test can be used) for the duration of the COVID-19 declaration under Section 564(b)(1) of the Act, 21  U.S.C. section 360bbb-3(b)(1), unless the authorization is terminated or revoked.  Performed at [facility], [address], [place], [place] [address]   Respiratory (~20 pathogens) panel by PCR     Status: None   Collection Time: 09/15/23  3:33 PM   Specimen: Nasopharyngeal Swab; Respiratory  Result Value Ref Range Status   Adenovirus NOT DETECTED NOT DETECTED Final   Coronavirus 229E NOT DETECTED NOT DETECTED Final    Comment: (NOTE) The Coronavirus on the Respiratory Panel, DOES NOT test for the novel  Coronavirus (2019 nCoV)    Coronavirus HKU1 NOT DETECTED NOT DETECTED Final   Coronavirus NL63 NOT DETECTED NOT DETECTED Final   Coronavirus OC43 NOT DETECTED NOT DETECTED Final   Metapneumovirus NOT DETECTED NOT DETECTED Final   Rhinovirus / Enterovirus NOT DETECTED NOT DETECTED Final   Influenza A NOT DETECTED NOT DETECTED Final   Influenza B NOT DETECTED NOT DETECTED Final   Parainfluenza Virus 1 NOT DETECTED NOT DETECTED Final   Parainfluenza Virus 2 NOT DETECTED NOT DETECTED Final   Parainfluenza Virus 3 NOT DETECTED NOT DETECTED Final   Parainfluenza Virus 4 NOT DETECTED NOT DETECTED Final   Respiratory Syncytial Virus NOT DETECTED NOT DETECTED Final   Bordetella pertussis NOT DETECTED NOT DETECTED Final   Bordetella Parapertussis NOT DETECTED NOT DETECTED Final   Chlamydophila pneumoniae NOT DETECTED NOT DETECTED Final   Mycoplasma pneumoniae NOT DETECTED NOT DETECTED Final    Comment: Performed at [facility], [address], [place], [place] [address]    Labs: CBC: Recent Labs  Lab 09/15/23 1045  WBC 6.6  NEUTROABS 4.3  HGB 11.1*  HCT 35.9*  MCV 88.0  PLT 160   Basic Metabolic Panel: Recent Labs  Lab 09/15/23 1045 09/16/23 1136 09/17/23 0432 09/18/23 0543  NA 138 140 139 134*  K 4.3 4.2 4.1 4.3  CL 102 101 102 101  CO2 24 26 26 23   GLUCOSE 110* 146* 108* 135*  BUN 30* 29* 33* 43*  CREATININE 2.62* 2.46* 2.60* 2.90*  CALCIUM  8.7* 9.4 9.0  8.6*  MG  --  1.6* 1.6* 2.3   Liver Function Tests: Recent Labs  Lab 09/15/23 1045  AST 15  ALT 13  ALKPHOS 104  BILITOT 1.0  PROT 7.5  ALBUMIN 2.9*   CBG: Recent Labs  Lab 09/17/23 0720 09/17/23 1108 09/17/23 1604 09/17/23 2125 09/18/23 0742  GLUCAP 122* 157* 187* 143* 126*    Discharge time spent: greater than 30 minutes.  Signed: Alm Schneider, MD [facility] 09/18/2023

## 2023-09-18 NOTE — Plan of Care (Signed)

## 2023-09-19 ENCOUNTER — Telehealth: Payer: Self-pay

## 2023-09-19 DIAGNOSIS — I5042 Chronic combined systolic (congestive) and diastolic (congestive) heart failure: Secondary | ICD-10-CM | POA: Diagnosis not present

## 2023-09-19 DIAGNOSIS — F02A4 Dementia in other diseases classified elsewhere, mild, with anxiety: Secondary | ICD-10-CM | POA: Diagnosis not present

## 2023-09-19 DIAGNOSIS — K219 Gastro-esophageal reflux disease without esophagitis: Secondary | ICD-10-CM | POA: Diagnosis not present

## 2023-09-19 DIAGNOSIS — J154 Pneumonia due to other streptococci: Secondary | ICD-10-CM | POA: Diagnosis not present

## 2023-09-19 DIAGNOSIS — N184 Chronic kidney disease, stage 4 (severe): Secondary | ICD-10-CM | POA: Diagnosis not present

## 2023-09-19 DIAGNOSIS — F5101 Primary insomnia: Secondary | ICD-10-CM | POA: Diagnosis not present

## 2023-09-19 DIAGNOSIS — I10 Essential (primary) hypertension: Secondary | ICD-10-CM | POA: Diagnosis not present

## 2023-09-19 DIAGNOSIS — N521 Erectile dysfunction due to diseases classified elsewhere: Secondary | ICD-10-CM | POA: Diagnosis not present

## 2023-09-19 DIAGNOSIS — Z6834 Body mass index (BMI) 34.0-34.9, adult: Secondary | ICD-10-CM | POA: Diagnosis not present

## 2023-09-19 DIAGNOSIS — E1122 Type 2 diabetes mellitus with diabetic chronic kidney disease: Secondary | ICD-10-CM | POA: Diagnosis not present

## 2023-09-19 DIAGNOSIS — M109 Gout, unspecified: Secondary | ICD-10-CM | POA: Diagnosis not present

## 2023-09-19 NOTE — Transitions of Care (Post Inpatient/ED Visit) (Signed)
   09/19/2023  Name: Terry Stevens MRN: [id number] DOB: November 22, 1945  Today's TOC FU Call Status: Today's TOC FU Call Status:: Unsuccessful Call (1st Attempt) Unsuccessful Call (1st Attempt) Date: 09/19/23  Attempted to reach the patient regarding the most recent Inpatient/ED visit.  Follow Up Plan: Additional outreach attempts will be made to reach the patient to complete the Transitions of Care (Post Inpatient/ED visit) call.    Larnce Schnackenberg J. Aparna Vanderweele RN, MSN [facility], [facility] RN Care Manager Direct Dial: [phone]  Fax: [phone] Website: [email or web address]

## 2023-09-20 ENCOUNTER — Telehealth: Payer: Self-pay

## 2023-09-20 NOTE — Transitions of Care (Post Inpatient/ED Visit) (Signed)
   09/20/2023  Name: Terry Stevens MRN: [id number] DOB: [birth year]  Today's TOC FU Call Status: Today's TOC FU Call Status:: Unsuccessful Call (2nd Attempt) Unsuccessful Call (2nd Attempt) Date: 09/20/23  Attempted to reach the patient regarding the most recent Inpatient/ED visit.  Follow Up Plan: Additional outreach attempts will be made to reach the patient to complete the Transitions of Care (Post Inpatient/ED visit) call.   Blimie Vaness J. Meleena Munroe RN, MSN [facility], [facility] RN Care Manager Direct Dial: [phone]  Fax: [phone] Website: [email or web address]

## 2023-09-22 ENCOUNTER — Telehealth: Payer: Self-pay

## 2023-09-22 NOTE — Transitions of Care (Post Inpatient/ED Visit) (Signed)
   09/22/2023  Name: Terry Stevens MRN: [id number] DOB: 1945-10-05  Today's TOC FU Call Status: Today's TOC FU Call Status:: Unsuccessful Call (3rd Attempt) Unsuccessful Call (3rd Attempt) Date: 09/22/23  Attempted to reach the patient regarding the most recent Inpatient/ED visit.  Follow Up Plan: No further outreach attempts will be made at this time. We have been unable to contact the patient.  Arthor Gorter J. Cresta Riden RN, MSN [facility], [facility] RN Care Manager Direct Dial: [phone]  Fax: [phone] Website: [email or web address]

## 2023-09-23 DIAGNOSIS — Z008 Encounter for other general examination: Secondary | ICD-10-CM | POA: Diagnosis not present

## 2023-09-23 DIAGNOSIS — R451 Restlessness and agitation: Secondary | ICD-10-CM | POA: Diagnosis not present

## 2023-09-26 DIAGNOSIS — R059 Cough, unspecified: Secondary | ICD-10-CM | POA: Diagnosis not present

## 2023-09-30 DIAGNOSIS — Z125 Encounter for screening for malignant neoplasm of prostate: Secondary | ICD-10-CM | POA: Diagnosis not present

## 2023-09-30 DIAGNOSIS — N184 Chronic kidney disease, stage 4 (severe): Secondary | ICD-10-CM | POA: Diagnosis not present

## 2023-09-30 DIAGNOSIS — I10 Essential (primary) hypertension: Secondary | ICD-10-CM | POA: Diagnosis not present

## 2023-09-30 DIAGNOSIS — I5042 Chronic combined systolic (congestive) and diastolic (congestive) heart failure: Secondary | ICD-10-CM | POA: Diagnosis not present

## 2023-09-30 DIAGNOSIS — E1122 Type 2 diabetes mellitus with diabetic chronic kidney disease: Secondary | ICD-10-CM | POA: Diagnosis not present

## 2023-10-05 DIAGNOSIS — Z6834 Body mass index (BMI) 34.0-34.9, adult: Secondary | ICD-10-CM | POA: Diagnosis not present

## 2023-10-05 DIAGNOSIS — F0150 Vascular dementia without behavioral disturbance: Secondary | ICD-10-CM | POA: Diagnosis not present

## 2023-10-07 DIAGNOSIS — M6281 Muscle weakness (generalized): Secondary | ICD-10-CM | POA: Diagnosis not present

## 2023-10-07 DIAGNOSIS — Z008 Encounter for other general examination: Secondary | ICD-10-CM | POA: Diagnosis not present

## 2023-10-07 DIAGNOSIS — N184 Chronic kidney disease, stage 4 (severe): Secondary | ICD-10-CM | POA: Diagnosis not present

## 2023-10-07 DIAGNOSIS — J189 Pneumonia, unspecified organism: Secondary | ICD-10-CM | POA: Diagnosis not present

## 2023-10-07 DIAGNOSIS — F0150 Vascular dementia without behavioral disturbance: Secondary | ICD-10-CM | POA: Diagnosis not present

## 2023-10-07 DIAGNOSIS — I5032 Chronic diastolic (congestive) heart failure: Secondary | ICD-10-CM | POA: Diagnosis not present

## 2023-10-07 DIAGNOSIS — I129 Hypertensive chronic kidney disease with stage 1 through stage 4 chronic kidney disease, or unspecified chronic kidney disease: Secondary | ICD-10-CM | POA: Diagnosis not present

## 2023-10-07 DIAGNOSIS — E1165 Type 2 diabetes mellitus with hyperglycemia: Secondary | ICD-10-CM | POA: Diagnosis not present

## 2023-10-11 DIAGNOSIS — I129 Hypertensive chronic kidney disease with stage 1 through stage 4 chronic kidney disease, or unspecified chronic kidney disease: Secondary | ICD-10-CM | POA: Diagnosis not present

## 2023-10-11 DIAGNOSIS — Z008 Encounter for other general examination: Secondary | ICD-10-CM | POA: Diagnosis not present

## 2023-10-11 DIAGNOSIS — M6281 Muscle weakness (generalized): Secondary | ICD-10-CM | POA: Diagnosis not present

## 2023-10-11 DIAGNOSIS — F0150 Vascular dementia without behavioral disturbance: Secondary | ICD-10-CM | POA: Diagnosis not present

## 2023-10-11 DIAGNOSIS — J189 Pneumonia, unspecified organism: Secondary | ICD-10-CM | POA: Diagnosis not present

## 2023-10-11 DIAGNOSIS — I5032 Chronic diastolic (congestive) heart failure: Secondary | ICD-10-CM | POA: Diagnosis not present

## 2023-10-11 DIAGNOSIS — N184 Chronic kidney disease, stage 4 (severe): Secondary | ICD-10-CM | POA: Diagnosis not present

## 2023-10-11 DIAGNOSIS — E1165 Type 2 diabetes mellitus with hyperglycemia: Secondary | ICD-10-CM | POA: Diagnosis not present

## 2023-10-12 DIAGNOSIS — Z6836 Body mass index (BMI) 36.0-36.9, adult: Secondary | ICD-10-CM | POA: Diagnosis not present

## 2023-10-12 DIAGNOSIS — M6281 Muscle weakness (generalized): Secondary | ICD-10-CM | POA: Diagnosis not present

## 2023-10-12 DIAGNOSIS — Z9181 History of falling: Secondary | ICD-10-CM | POA: Diagnosis not present

## 2023-10-12 DIAGNOSIS — I5032 Chronic diastolic (congestive) heart failure: Secondary | ICD-10-CM | POA: Diagnosis not present

## 2023-10-12 DIAGNOSIS — R269 Unspecified abnormalities of gait and mobility: Secondary | ICD-10-CM | POA: Diagnosis not present

## 2023-10-12 DIAGNOSIS — Z008 Encounter for other general examination: Secondary | ICD-10-CM | POA: Diagnosis not present

## 2023-10-17 DIAGNOSIS — M6281 Muscle weakness (generalized): Secondary | ICD-10-CM | POA: Diagnosis not present

## 2023-10-17 DIAGNOSIS — I5032 Chronic diastolic (congestive) heart failure: Secondary | ICD-10-CM | POA: Diagnosis not present

## 2023-10-19 DIAGNOSIS — I5033 Acute on chronic diastolic (congestive) heart failure: Secondary | ICD-10-CM | POA: Diagnosis not present

## 2023-10-19 DIAGNOSIS — N184 Chronic kidney disease, stage 4 (severe): Secondary | ICD-10-CM | POA: Diagnosis not present

## 2023-10-19 DIAGNOSIS — E1122 Type 2 diabetes mellitus with diabetic chronic kidney disease: Secondary | ICD-10-CM | POA: Diagnosis not present

## 2023-10-19 DIAGNOSIS — E1165 Type 2 diabetes mellitus with hyperglycemia: Secondary | ICD-10-CM | POA: Diagnosis not present

## 2023-10-19 DIAGNOSIS — Z794 Long term (current) use of insulin: Secondary | ICD-10-CM | POA: Diagnosis not present

## 2023-10-19 DIAGNOSIS — F01511 Vascular dementia, unspecified severity, with agitation: Secondary | ICD-10-CM | POA: Diagnosis not present

## 2023-10-19 DIAGNOSIS — I130 Hypertensive heart and chronic kidney disease with heart failure and stage 1 through stage 4 chronic kidney disease, or unspecified chronic kidney disease: Secondary | ICD-10-CM | POA: Diagnosis not present

## 2023-10-19 DIAGNOSIS — F02811 Dementia in other diseases classified elsewhere, unspecified severity, with agitation: Secondary | ICD-10-CM | POA: Diagnosis not present

## 2023-10-19 DIAGNOSIS — G309 Alzheimer's disease, unspecified: Secondary | ICD-10-CM | POA: Diagnosis not present

## 2023-10-30 ENCOUNTER — Encounter (HOSPITAL_COMMUNITY): Payer: Self-pay

## 2023-10-30 ENCOUNTER — Other Ambulatory Visit: Payer: Self-pay

## 2023-10-30 ENCOUNTER — Emergency Department (HOSPITAL_COMMUNITY)

## 2023-10-30 ENCOUNTER — Inpatient Hospital Stay (HOSPITAL_COMMUNITY)
Admission: EM | Admit: 2023-10-30 | Discharge: 2023-11-03 | DRG: 177 | Disposition: A | Discharge status: Skilled Nursing Facility | Attending: Family Medicine | Admitting: Family Medicine

## 2023-10-30 DIAGNOSIS — Z79899 Other long term (current) drug therapy: Secondary | ICD-10-CM

## 2023-10-30 DIAGNOSIS — D631 Anemia in chronic kidney disease: Secondary | ICD-10-CM | POA: Diagnosis present

## 2023-10-30 DIAGNOSIS — G309 Alzheimer's disease, unspecified: Secondary | ICD-10-CM | POA: Diagnosis present

## 2023-10-30 DIAGNOSIS — M109 Gout, unspecified: Secondary | ICD-10-CM | POA: Diagnosis present

## 2023-10-30 DIAGNOSIS — R410 Disorientation, unspecified: Secondary | ICD-10-CM | POA: Diagnosis not present

## 2023-10-30 DIAGNOSIS — E66812 Obesity, class 2: Secondary | ICD-10-CM | POA: Diagnosis present

## 2023-10-30 DIAGNOSIS — J189 Pneumonia, unspecified organism: Secondary | ICD-10-CM | POA: Diagnosis not present

## 2023-10-30 DIAGNOSIS — Z87891 Personal history of nicotine dependence: Secondary | ICD-10-CM

## 2023-10-30 DIAGNOSIS — Z880 Allergy status to penicillin: Secondary | ICD-10-CM

## 2023-10-30 DIAGNOSIS — J168 Pneumonia due to other specified infectious organisms: Secondary | ICD-10-CM | POA: Diagnosis not present

## 2023-10-30 DIAGNOSIS — R0902 Hypoxemia: Secondary | ICD-10-CM | POA: Diagnosis not present

## 2023-10-30 DIAGNOSIS — I10 Essential (primary) hypertension: Secondary | ICD-10-CM | POA: Diagnosis present

## 2023-10-30 DIAGNOSIS — R1310 Dysphagia, unspecified: Secondary | ICD-10-CM | POA: Diagnosis present

## 2023-10-30 DIAGNOSIS — E785 Hyperlipidemia, unspecified: Secondary | ICD-10-CM | POA: Diagnosis present

## 2023-10-30 DIAGNOSIS — J1569 Pneumonia due to other gram-negative bacteria: Principal | ICD-10-CM | POA: Diagnosis present

## 2023-10-30 DIAGNOSIS — I130 Hypertensive heart and chronic kidney disease with heart failure and stage 1 through stage 4 chronic kidney disease, or unspecified chronic kidney disease: Secondary | ICD-10-CM | POA: Diagnosis present

## 2023-10-30 DIAGNOSIS — K219 Gastro-esophageal reflux disease without esophagitis: Secondary | ICD-10-CM | POA: Diagnosis present

## 2023-10-30 DIAGNOSIS — Z66 Do not resuscitate: Secondary | ICD-10-CM | POA: Diagnosis present

## 2023-10-30 DIAGNOSIS — N184 Chronic kidney disease, stage 4 (severe): Secondary | ICD-10-CM | POA: Diagnosis present

## 2023-10-30 DIAGNOSIS — Z6836 Body mass index (BMI) 36.0-36.9, adult: Secondary | ICD-10-CM

## 2023-10-30 DIAGNOSIS — Z96652 Presence of left artificial knee joint: Secondary | ICD-10-CM | POA: Diagnosis present

## 2023-10-30 DIAGNOSIS — J9601 Acute respiratory failure with hypoxia: Secondary | ICD-10-CM | POA: Diagnosis present

## 2023-10-30 DIAGNOSIS — E1122 Type 2 diabetes mellitus with diabetic chronic kidney disease: Secondary | ICD-10-CM | POA: Diagnosis present

## 2023-10-30 DIAGNOSIS — G9341 Metabolic encephalopathy: Secondary | ICD-10-CM | POA: Diagnosis present

## 2023-10-30 DIAGNOSIS — Z7982 Long term (current) use of aspirin: Secondary | ICD-10-CM

## 2023-10-30 DIAGNOSIS — E119 Type 2 diabetes mellitus without complications: Secondary | ICD-10-CM

## 2023-10-30 LAB — CBC WITH DIFFERENTIAL/PLATELET
Abs Immature Granulocytes: 0.04 K/uL (ref 0.00–0.07)
Basophils Absolute: 0 K/uL (ref 0.0–0.1)
Basophils Relative: 0 %
Eosinophils Absolute: 0.1 K/uL (ref 0.0–0.5)
Eosinophils Relative: 1 %
HCT: 34 % — ABNORMAL LOW (ref 39.0–52.0)
Hemoglobin: 11 g/dL — ABNORMAL LOW (ref 13.0–17.0)
Immature Granulocytes: 0 %
Lymphocytes Relative: 16 %
Lymphs Abs: 1.9 K/uL (ref 0.7–4.0)
MCH: 28.4 pg (ref 26.0–34.0)
MCHC: 32.4 g/dL (ref 30.0–36.0)
MCV: 87.6 fL (ref 80.0–100.0)
Monocytes Absolute: 0.6 K/uL (ref 0.1–1.0)
Monocytes Relative: 5 %
Neutro Abs: 9.5 K/uL — ABNORMAL HIGH (ref 1.7–7.7)
Neutrophils Relative %: 78 %
Platelets: 202 K/uL (ref 150–400)
RBC: 3.88 MIL/uL — ABNORMAL LOW (ref 4.22–5.81)
RDW: 15.7 % — ABNORMAL HIGH (ref 11.5–15.5)
WBC: 12.2 K/uL — ABNORMAL HIGH (ref 4.0–10.5)
nRBC: 0 % (ref 0.0–0.2)

## 2023-10-30 LAB — BASIC METABOLIC PANEL WITH GFR
Anion gap: 10 (ref 5–15)
BUN: 34 mg/dL — ABNORMAL HIGH (ref 8–23)
CO2: 24 mmol/L (ref 22–32)
Calcium: 8.6 mg/dL — ABNORMAL LOW (ref 8.9–10.3)
Chloride: 106 mmol/L (ref 98–111)
Creatinine, Ser: 2.85 mg/dL — ABNORMAL HIGH (ref 0.61–1.24)
GFR, Estimated: 22 mL/min — ABNORMAL LOW (ref >60)
Glucose, Bld: 107 mg/dL — ABNORMAL HIGH (ref 70–99)
Potassium: 4.3 mmol/L (ref 3.5–5.1)
Sodium: 140 mmol/L (ref 135–145)

## 2023-10-30 LAB — URINALYSIS, W/ REFLEX TO CULTURE (INFECTION SUSPECTED)
Bacteria, UA: NONE SEEN
Bilirubin Urine: NEGATIVE
Glucose, UA: NEGATIVE mg/dL
Hgb urine dipstick: NEGATIVE
Ketones, ur: NEGATIVE mg/dL
Nitrite: NEGATIVE
Protein, ur: NEGATIVE mg/dL
Specific Gravity, Urine: 1.02 (ref 1.005–1.030)
pH: 5 (ref 5.0–8.0)

## 2023-10-30 LAB — RESP PANEL BY RT-PCR (RSV, FLU A&B, COVID)  RVPGX2
Influenza A by PCR: NEGATIVE
Influenza B by PCR: NEGATIVE
Resp Syncytial Virus by PCR: NEGATIVE
SARS Coronavirus 2 by RT PCR: NEGATIVE

## 2023-10-30 LAB — LACTIC ACID, PLASMA: Lactic Acid, Venous: 0.7 mmol/L (ref 0.5–1.9)

## 2023-10-30 LAB — BRAIN NATRIURETIC PEPTIDE: B Natriuretic Peptide: 64 pg/mL (ref 0.0–100.0)

## 2023-10-30 LAB — CBG MONITORING, ED: Glucose-Capillary: 73 mg/dL (ref 70–99)

## 2023-10-30 LAB — PROCALCITONIN: Procalcitonin: 20 ng/mL

## 2023-10-30 LAB — GLUCOSE, CAPILLARY: Glucose-Capillary: 116 mg/dL — ABNORMAL HIGH (ref 70–99)

## 2023-10-30 MED ORDER — ONDANSETRON HCL 4 MG/2ML IJ SOLN
4.0000 mg | Freq: Four times a day (QID) | INTRAMUSCULAR | Status: DC | PRN
  Administered 2023-10-31: 4 mg via INTRAVENOUS
  Filled 2023-10-30: qty 2

## 2023-10-30 MED ORDER — MEMANTINE HCL 10 MG PO TABS
10.0000 mg | ORAL_TABLET | Freq: Two times a day (BID) | ORAL | Status: DC
  Administered 2023-10-30 – 2023-11-03 (×8): 10 mg via ORAL
  Filled 2023-10-30 (×7): qty 1

## 2023-10-30 MED ORDER — HYDRALAZINE HCL 20 MG/ML IJ SOLN: 5.0000 mg | INTRAMUSCULAR | Status: DC | PRN

## 2023-10-30 MED ORDER — ATORVASTATIN CALCIUM 20 MG PO TABS
20.0000 mg | ORAL_TABLET | Freq: Every day | ORAL | Status: DC
Start: 2023-10-30 — End: 2023-11-03
  Administered 2023-10-30 – 2023-11-02 (×4): 20 mg via ORAL
  Filled 2023-10-30 (×4): qty 1

## 2023-10-30 MED ORDER — DONEPEZIL HCL 5 MG PO TABS
10.0000 mg | ORAL_TABLET | Freq: Every evening | ORAL | Status: DC
  Administered 2023-10-30 – 2023-11-02 (×4): 10 mg via ORAL
  Filled 2023-10-30 (×4): qty 2

## 2023-10-30 MED ORDER — ACETAMINOPHEN 325 MG PO TABS: 650.0000 mg | ORAL_TABLET | Freq: Four times a day (QID) | ORAL | Status: DC | PRN

## 2023-10-30 MED ORDER — METOCLOPRAMIDE HCL 10 MG PO TABS
5.0000 mg | ORAL_TABLET | Freq: Three times a day (TID) | ORAL | Status: DC
  Administered 2023-10-30 – 2023-11-03 (×11): 5 mg via ORAL
  Filled 2023-10-30 (×10): qty 1

## 2023-10-30 MED ORDER — ONDANSETRON HCL 4 MG PO TABS: 4.0000 mg | ORAL_TABLET | Freq: Four times a day (QID) | ORAL | Status: DC | PRN

## 2023-10-30 MED ORDER — GUAIFENESIN ER 600 MG PO TB12
600.0000 mg | ORAL_TABLET | Freq: Two times a day (BID) | ORAL | Status: DC | PRN
  Administered 2023-10-30: 600 mg via ORAL
  Filled 2023-10-30: qty 1

## 2023-10-30 MED ORDER — CARVEDILOL 12.5 MG PO TABS
25.0000 mg | ORAL_TABLET | Freq: Two times a day (BID) | ORAL | Status: DC
  Administered 2023-10-30 – 2023-11-03 (×8): 25 mg via ORAL
  Filled 2023-10-30 (×8): qty 2

## 2023-10-30 MED ORDER — POLYETHYLENE GLYCOL 3350 17 G PO PACK
17.0000 g | PACK | Freq: Every day | ORAL | Status: DC | PRN
  Administered 2023-11-02: 17 g via ORAL
  Filled 2023-10-30: qty 1

## 2023-10-30 MED ORDER — INSULIN DEGLUDEC 100 UNIT/ML ~~LOC~~ SOPN
30.0000 [IU] | PEN_INJECTOR | Freq: Two times a day (BID) | SUBCUTANEOUS | Status: DC
Start: 2023-10-30 — End: 2023-10-31
  Filled 2023-10-30: qty 3

## 2023-10-30 MED ORDER — SODIUM CHLORIDE 0.9 % IV SOLN: INTRAVENOUS | Status: AC

## 2023-10-30 MED ORDER — OXYCODONE HCL 5 MG PO TABS: 5.0000 mg | ORAL_TABLET | ORAL | Status: DC | PRN

## 2023-10-30 MED ORDER — ALBUTEROL SULFATE (2.5 MG/3ML) 0.083% IN NEBU: 2.5000 mg | INHALATION_SOLUTION | RESPIRATORY_TRACT | Status: DC | PRN

## 2023-10-30 MED ORDER — PANTOPRAZOLE SODIUM 40 MG PO TBEC
40.0000 mg | DELAYED_RELEASE_TABLET | Freq: Two times a day (BID) | ORAL | Status: DC
  Administered 2023-10-30 – 2023-11-03 (×7): 40 mg via ORAL
  Filled 2023-10-30 (×8): qty 1

## 2023-10-30 MED ORDER — TRAZODONE HCL 50 MG PO TABS
100.0000 mg | ORAL_TABLET | Freq: Every day | ORAL | Status: DC
  Administered 2023-10-30 – 2023-11-02 (×4): 100 mg via ORAL
  Filled 2023-10-30 (×4): qty 2

## 2023-10-30 MED ORDER — SODIUM CHLORIDE 0.9 % IV SOLN
500.0000 mg | INTRAVENOUS | Status: DC
  Administered 2023-10-30 – 2023-11-02 (×4): 500 mg via INTRAVENOUS
  Filled 2023-10-30 (×5): qty 5

## 2023-10-30 MED ORDER — INSULIN ASPART 100 UNIT/ML IJ SOLN
0.0000 [IU] | Freq: Three times a day (TID) | INTRAMUSCULAR | Status: DC
  Administered 2023-10-31: 2 [IU] via SUBCUTANEOUS
  Administered 2023-10-31 – 2023-11-02 (×2): 1 [IU] via SUBCUTANEOUS

## 2023-10-30 MED ORDER — BISACODYL 5 MG PO TBEC: 5.0000 mg | DELAYED_RELEASE_TABLET | Freq: Every day | ORAL | Status: DC | PRN

## 2023-10-30 MED ORDER — TERAZOSIN HCL 5 MG PO CAPS
5.0000 mg | ORAL_CAPSULE | Freq: Every day | ORAL | Status: DC
Start: 2023-10-30 — End: 2023-11-03
  Administered 2023-10-30 – 2023-11-02 (×4): 5 mg via ORAL
  Filled 2023-10-30 (×5): qty 1

## 2023-10-30 MED ORDER — ENOXAPARIN SODIUM 30 MG/0.3ML IJ SOSY
30.0000 mg | PREFILLED_SYRINGE | INTRAMUSCULAR | Status: DC
  Administered 2023-10-30 – 2023-11-02 (×4): 30 mg via SUBCUTANEOUS
  Filled 2023-10-30 (×4): qty 0.3

## 2023-10-30 MED ORDER — AMLODIPINE BESYLATE 5 MG PO TABS
5.0000 mg | ORAL_TABLET | Freq: Every day | ORAL | Status: DC
  Administered 2023-10-30 – 2023-11-03 (×5): 5 mg via ORAL
  Filled 2023-10-30 (×5): qty 1

## 2023-10-30 MED ORDER — SODIUM BICARBONATE 650 MG PO TABS
650.0000 mg | ORAL_TABLET | Freq: Two times a day (BID) | ORAL | Status: DC
  Administered 2023-10-30 – 2023-11-03 (×8): 650 mg via ORAL
  Filled 2023-10-30 (×8): qty 1

## 2023-10-30 MED ORDER — SODIUM CHLORIDE 0.9 % IV SOLN
2.0000 g | INTRAVENOUS | Status: DC
  Administered 2023-10-31 – 2023-11-02 (×3): 2 g via INTRAVENOUS
  Filled 2023-10-30 (×3): qty 20

## 2023-10-30 MED ORDER — ASPIRIN 81 MG PO TBEC
81.0000 mg | DELAYED_RELEASE_TABLET | Freq: Every day | ORAL | Status: DC
  Administered 2023-10-30 – 2023-11-03 (×5): 81 mg via ORAL
  Filled 2023-10-30 (×5): qty 1

## 2023-10-30 MED ORDER — ACETAMINOPHEN 650 MG RE SUPP: 650.0000 mg | Freq: Four times a day (QID) | RECTAL | Status: DC | PRN

## 2023-10-30 MED ORDER — ALLOPURINOL 100 MG PO TABS
100.0000 mg | ORAL_TABLET | Freq: Every day | ORAL | Status: DC
  Administered 2023-10-30 – 2023-11-03 (×5): 100 mg via ORAL
  Filled 2023-10-30 (×5): qty 1

## 2023-10-30 MED ORDER — SODIUM CHLORIDE 0.9 % IV SOLN
1.0000 g | Freq: Once | INTRAVENOUS | Status: AC
  Administered 2023-10-30: 1 g via INTRAVENOUS
  Filled 2023-10-30: qty 10

## 2023-10-30 MED ORDER — DOCUSATE SODIUM 100 MG PO CAPS
100.0000 mg | ORAL_CAPSULE | Freq: Two times a day (BID) | ORAL | Status: DC
  Administered 2023-10-30 – 2023-11-03 (×7): 100 mg via ORAL
  Filled 2023-10-30 (×7): qty 1

## 2023-10-30 NOTE — ED Provider Notes (Signed)
 [facility] EMERGENCY DEPARTMENT AT [facility] Provider Note   CSN: [id number] Arrival date & time: 10/30/23  1141     Patient presents with: Cough   Terry Stevens is a 78 y.o. male.   Patient with history of Alzheimer's, cared for by his wife at home, comes in for evaluation of symptoms over the last week of progressive confusion over Alzheimer's baseline, refusing to take medications, sleeping most of the day which is highly unusual, productive cough. No vomiting. He was recently started on an oral solution for thrush but since that time has refused to take any of his medications.   The history is provided by the patient and the spouse. No language interpreter was used.  Cough      Prior to Admission medications   Medication Sig Start Date End Date Taking? Authorizing Provider  acetaminophen  (TYLENOL ) 325 MG tablet Take 2 tablets (650 mg total) by mouth every 6 (six) hours as needed for mild pain (pain score 1-3) or fever (or Fever >/= 101). 08/17/23   Emokpae, Courage, MD  allopurinol  (ZYLOPRIM ) 100 MG tablet Take 1 tablet by mouth daily.    [provider]  amLODipine  (NORVASC ) 5 MG tablet Take 5 mg by mouth daily.    [provider]  aspirin  81 MG tablet Take 1 tablet (81 mg total) by mouth daily with breakfast. 08/17/23   Emokpae, Courage, MD  atorvastatin  (LIPITOR) 20 MG tablet Take 20 mg by mouth daily at 6 PM.     [provider]  azithromycin  (ZITHROMAX ) 500 MG tablet Take 1 tablet (500 mg total) by mouth daily. 09/18/23   Evonnie Lenis, MD  carvedilol  (COREG ) 6.25 MG tablet Take 1 tablet (6.25 mg total) by mouth 2 (two) times daily with a meal. 09/18/23   Tat, Lenis, MD  cefdinir  (OMNICEF ) 300 MG capsule Take 1 capsule (300 mg total) by mouth 2 (two) times daily. 09/18/23   Evonnie Lenis, MD  donepezil  (ARICEPT ) 10 MG tablet Take 10 mg by mouth every evening.    [provider]  folic acid  (FOLVITE ) 1 MG tablet Take 1 tablet by mouth daily.  01/25/23   [provider]  furosemide  (LASIX ) 40 MG tablet Take 20 mg by mouth every morning. 07/25/23   [provider]  memantine  (NAMENDA ) 10 MG tablet Take 10 mg by mouth 2 (two) times daily.    [provider]  Omega-3 Fatty Acids (FISH OIL ) 1000 MG CAPS Take 1,000 mg by mouth 2 (two) times daily.    [provider]  pantoprazole  (PROTONIX ) 40 MG tablet Take 1 tablet (40 mg total) by mouth 2 (two) times daily. 08/17/23   Pearlean Manus, MD  sodium bicarbonate  650 MG tablet Take 1 tablet (650 mg total) by mouth 2 (two) times daily. 08/17/23   Pearlean Manus, MD  spironolactone  (ALDACTONE ) 25 MG tablet Take 25 mg by mouth daily. 08/24/23   [provider]  terazosin  (HYTRIN ) 5 MG capsule Take 5 mg by mouth at bedtime.    [provider]  vitamin B-12 (VITAMIN B12) 500 MCG tablet Take 1 tablet (500 mcg total) by mouth daily. 09/19/23   Evonnie Lenis, MD    Allergies: Penicillins    Review of Systems  Respiratory:  Positive for cough.     Updated Vital Signs BP 128/73   Pulse 61   Temp 97.9 F (36.6 C) (Oral)   Resp 16   Ht 5' 7 (1.702 m)   Wt 106.5  kg   SpO2 95%   BMI 36.77 kg/m   Physical Exam Vitals and nursing note reviewed.  Constitutional:      Comments: Sleeping but easily awakened. Cooperative with exam.   HENT:     Head: Normocephalic.     Nose: Nose normal.     Mouth/Throat:     Mouth: Mucous membranes are moist.     Comments: No evidence of persistent thrush.  Eyes:     Pupils: Pupils are equal, round, and reactive to light.  Cardiovascular:     Rate and Rhythm: Normal rate and regular rhythm.     Heart sounds: No murmur heard. Pulmonary:     Effort: Pulmonary effort is normal.     Breath sounds: No wheezing, rhonchi or rales.     Comments: Poor effort.  Abdominal:     General: There is no distension.     Palpations: Abdomen is soft.     Tenderness: There is no abdominal tenderness.  Musculoskeletal:         General: Normal range of motion.     Cervical back: Normal range of motion and neck supple.     Comments: 2-3+ edema bilateral LE's.  Skin:    General: Skin is warm and dry.     Findings: No rash.  Neurological:     Comments: Follows command well. No strength deficits. Good eye contact, responsive verbally.      (all labs ordered are listed, but only abnormal results are displayed) Labs Reviewed  CBC WITH DIFFERENTIAL/PLATELET - Abnormal; Notable for the following components:      Result Value   WBC 12.2 (*)    RBC 3.88 (*)    Hemoglobin 11.0 (*)    HCT 34.0 (*)    RDW 15.7 (*)    Neutro Abs 9.5 (*)    All other components within normal limits  BASIC METABOLIC PANEL WITH GFR - Abnormal; Notable for the following components:   Glucose, Bld 107 (*)    BUN 34 (*)    Creatinine, Ser 2.85 (*)    Calcium  8.6 (*)    GFR, Estimated 22 (*)    All other components within normal limits  URINALYSIS, W/ REFLEX TO CULTURE (INFECTION SUSPECTED) - Abnormal; Notable for the following components:   Leukocytes,Ua SMALL (*)    All other components within normal limits  RESP PANEL BY RT-PCR (RSV, FLU A&B, COVID)  RVPGX2  URINE CULTURE  CULTURE, BLOOD (ROUTINE X 2)  CULTURE, BLOOD (ROUTINE X 2)  BRAIN NATRIURETIC PEPTIDE  LACTIC ACID, PLASMA  LACTIC ACID, PLASMA    EKG: EKG Interpretation Date/Time:  Sunday October 30 2023 13:19:35 EDT Ventricular Rate:  61 PR Interval:  168 QRS Duration:  86 QT Interval:  416 QTC Calculation: 419 R Axis:   35  Text Interpretation: Sinus rhythm Consider left atrial enlargement Abnormal R-wave progression, early transition No significant change since last tracing Confirmed by Dean Clarity [phone]) on 10/30/2023 2:39:17 PM  Radiology: CT Chest Wo Contrast Result Date: 10/30/2023 CLINICAL DATA:  Respiratory illness EXAM: CT CHEST WITHOUT CONTRAST TECHNIQUE: Multidetector CT imaging of the chest was performed following the standard protocol  without IV contrast. RADIATION DOSE REDUCTION: This exam was performed according to the departmental dose-optimization program which includes automated exposure control, adjustment of the mA and/or kV according to patient size and/or use of iterative reconstruction technique. COMPARISON:  CT chest abdomen and pelvis 08/14/2023. FINDINGS: Cardiovascular: Heart is mildly enlarged. There is no pericardial effusion.  The aorta is normal in size. Mediastinum/Nodes: There is a small hiatal hernia. Esophagus is within normal limits. There is a hypodense right thyroid nodule measuring 10 mm. Enlarged subcarinal lymph node measures 14 mm, unchanged. No other enlarged lymph nodes are seen. There are calcified right hilar and subcarinal lymph nodes similar to prior. Lungs/Pleura: Patchy ground-glass and airspace opacities are seen throughout the bilateral lower lobes, right greater than left. There also mild ground-glass opacities in the bilateral upper lobes and right middle lobe. There is no pleural effusion or pneumothorax. Small slightly nodular 6 mm density seen along the posterior aspect of the distal trachea just above the bifurcation. Upper Abdomen: There is a 2.5 cm cyst in the left kidney. No acute abnormalities. Musculoskeletal: Degenerative changes affect the spine. IMPRESSION: 1. Patchy ground-glass and airspace opacities throughout the bilateral lower lobes, right greater than left. Findings are concerning for multifocal pneumonia. 2. Small slightly nodular density along the posterior aspect of the distal trachea just above the bifurcation. Findings may be related to small focal lesions or secretions. Consider direct visualization. 3. Stable enlarged subcarinal lymph node. 4. Small hiatal hernia. 5. 1 cm incidental right thyroid nodule. No follow-up imaging recommended. Electronically Signed   By: Greig Pique M.D.   On: 10/30/2023 16:41   DG Chest Portable 1 View Result Date: 10/30/2023 CLINICAL DATA:  Cough.  EXAM: PORTABLE CHEST 1 VIEW COMPARISON:  09/15/2023. FINDINGS: Cardiopericardial silhouette is at upper limits of normal for size. Linear densities in both lung bases suggest atelectasis or scarring. No overt edema or focal consolidative opacity. No substantial pleural effusion. No acute bony abnormality. IMPRESSION: Bibasilar atelectasis or scarring. Electronically Signed   By: Camellia Candle M.D.   On: 10/30/2023 12:51     Procedures   Medications Ordered in the ED  cefTRIAXone  (ROCEPHIN ) 1 g in sodium chloride  0.9 % 100 mL IVPB (has no administration in time range)  azithromycin  (ZITHROMAX ) 500 mg in sodium chloride  0.9 % 250 mL IVPB (has no administration in time range)    Clinical Course as of 10/30/23 1725  Sun Oct 30, 2023  1544 Patient BIB his wife who is caregiver concerned for confusion above baseline, refusing to take medications, onset productive cough. She reports low-grade fever only. He is not wanting to eat or drink. Recent admission for pneumonia. [SU]  1716 Patient hypoxic on arrival to 89-90% RA, 2L O2 placed. Viral panel is negative. Mild leukocytosis of 12.2. UA negative for infection. Renal dysfunction c/w baseline. CXR concerning for bibasilar PNA but unclear. CT chest obtained and, per radiology: IMPRESSION: 1. Patchy ground-glass and airspace opacities throughout the bilateral lower lobes, right greater than left. Findings are concerning for multifocal pneumonia. 2. Small slightly nodular density along the posterior aspect of the distal trachea just above the bifurcation. Findings may be related to small focal lesions or secretions. Consider direct visualization. 3. Stable enlarged subcarinal lymph node. 4. Small hiatal hernia. 5. 1 cm incidental right thyroid nodule. No follow-up imaging recommended.   [SU]  1719 Will plan to admit for inpatient treatment based on CT findings, hypoxic state on arrival. Cultures pending.  [SU]    Clinical Course User Index [SU]  Odell Balls, PA-C                                 Medical Decision Making Amount and/or Complexity of Data Reviewed Labs: ordered. Radiology: ordered.  Final diagnoses:  Pneumonia of both lower lobes due to infectious organism  Disorientation  Hypoxia    ED Discharge Orders     None          Odell Balls, DEVONNA 10/30/23 1725    Dean Clarity, MD 11/02/23 [phone]

## 2023-10-30 NOTE — H&P (Signed)
 History and Physical    Patient: Terry Stevens FMW:[id number] DOB: 1945/03/17 DOA: 10/30/2023 DOS: the patient was seen and examined on 10/30/2023 PCP: Orpha Yancey LABOR, MD  Patient coming from: Home - lives with wife; NOK: Wife, [phone]   Chief Complaint: AMS, cough  HPI: Terry Stevens is a 78 y.o. male with medical history significant of dementia, DM, and HTN who presented on 9/21 with cough and worsening confusion.  She reports that he started coughing earlier in the week and he has been more agitated than usual.  He usually takes his medications but he has been refusing, pocketing it.  He isn't eating/drinking.  He is sleeping more than usual.  He usually can get up and walk around but he needs more help with toileting, bathing, feeding himself.  She knows that it is time to place him; he recently obtained his Medicaid card and she thinks he will need LTC.  Tmax this AM, 98.8.    ER Course:  Last admission 8/25 with PNA, here with the same.  Confused worse than baseline.  +productive cough.  No fever.  O2 89-90% in triage, not on home O2.  2+ pitting edema, negative BNP.  CT chest with bibasilar/multifocal PNA.  On Rocephin /Azithro.     Review of Systems: unable to review all systems due to the inability of the patient to answer questions. Past Medical History:  Diagnosis Date   Arthritis    Constipation    Dementia (HCC)    Diabetes mellitus without complication (HCC)    GERD (gastroesophageal reflux disease)    tums prn   Gout    Hypertension    Osteoarthritis of left knee 02/27/2013   Past Surgical History:  Procedure Laterality Date   CATARACT EXTRACTION W/PHACO Right 06/13/2020   Procedure: CATARACT EXTRACTION PHACO AND INTRAOCULAR LENS PLACEMENT RIGHT EYE;  Surgeon: Harrie Agent, MD;  Location: [facility];  Service: Ophthalmology;  Laterality: Right;  CDE  8.92   CATARACT EXTRACTION W/PHACO Left 06/27/2020   Procedure: CATARACT EXTRACTION PHACO AND INTRAOCULAR LENS  PLACEMENT LEFT EYE;  Surgeon: Harrie Agent, MD;  Location: [facility];  Service: Ophthalmology;  Laterality: Left;  left,  CDE=8.21   ESOPHAGOGASTRODUODENOSCOPY (EGD) WITH PROPOFOL  N/A 02/21/2023   Procedure: ESOPHAGOGASTRODUODENOSCOPY (EGD) WITH PROPOFOL ;  Surgeon: Cindie Carlin POUR, DO;  Location: [facility];  Service: Endoscopy;  Laterality: N/A;   HOT HEMOSTASIS  02/21/2023   Procedure: HOT HEMOSTASIS (ARGON PLASMA COAGULATION/BICAP);  Surgeon: Cindie Carlin POUR, DO;  Location: [facility];  Service: Endoscopy;;   NO PAST SURGERIES     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 His smoking use included cigarettes. He has been exposed to tobacco smoke. He has never used smokeless tobacco. He reports that he does not drink alcohol and does not use drugs.  Allergies  Allergen Reactions   Penicillins Other (See Comments)    Unsure of reaction per patient. Per pt's wife, not allergic    History reviewed. No pertinent family history.  Prior to Admission medications   Medication Sig Start Date End Date Taking? Authorizing Provider  acetaminophen  (TYLENOL ) 325 MG tablet Take 2 tablets (650 mg total) by mouth every 6 (six) hours as needed for mild pain (pain score 1-3) or fever (or Fever >/= 101). 08/17/23   Emokpae,  Courage, MD  allopurinol  (ZYLOPRIM ) 100 MG tablet Take 1 tablet by mouth daily.    [provider]  amLODipine  (NORVASC ) 5 MG tablet Take 5 mg by mouth daily.    [provider]  aspirin  81 MG tablet Take 1 tablet (81 mg total) by mouth daily with breakfast. 08/17/23   Pearlean, Courage, MD  atorvastatin  (LIPITOR) 20 MG tablet Take 20 mg by mouth daily at 6 PM.     [provider]  azithromycin  (ZITHROMAX ) 500 MG tablet Take 1 tablet (500 mg total) by mouth daily.  09/18/23   Evonnie Lenis, MD  carvedilol  (COREG ) 6.25 MG tablet Take 1 tablet (6.25 mg total) by mouth 2 (two) times daily with a meal. 09/18/23   Tat, Lenis, MD  cefdinir  (OMNICEF ) 300 MG capsule Take 1 capsule (300 mg total) by mouth 2 (two) times daily. 09/18/23   Evonnie Lenis, MD  donepezil  (ARICEPT ) 10 MG tablet Take 10 mg by mouth every evening.    [provider]  folic acid  (FOLVITE ) 1 MG tablet Take 1 tablet by mouth daily. 01/25/23   [provider]  furosemide  (LASIX ) 40 MG tablet Take 20 mg by mouth every morning. 07/25/23   [provider]  memantine  (NAMENDA ) 10 MG tablet Take 10 mg by mouth 2 (two) times daily.    [provider]  Omega-3 Fatty Acids (FISH OIL ) 1000 MG CAPS Take 1,000 mg by mouth 2 (two) times daily.    [provider]  pantoprazole  (PROTONIX ) 40 MG tablet Take 1 tablet (40 mg total) by mouth 2 (two) times daily. 08/17/23   Pearlean Manus, MD  sodium bicarbonate  650 MG tablet Take 1 tablet (650 mg total) by mouth 2 (two) times daily. 08/17/23   Pearlean Manus, MD  spironolactone  (ALDACTONE ) 25 MG tablet Take 25 mg by mouth daily. 08/24/23   [provider]  terazosin  (HYTRIN ) 5 MG capsule Take 5 mg by mouth at bedtime.    [provider]  vitamin B-12 (VITAMIN B12) 500 MCG tablet Take 1 tablet (500 mcg total) by mouth daily. 09/19/23   Evonnie Lenis, MD    Physical Exam: Vitals:   10/30/23 1345 10/30/23 1400 10/30/23 1523 10/30/23 1700  BP: 128/62 139/63 128/73 131/71  Pulse: 60 60 61 (!) 59  Resp: 17 15 16 14   Temp:   97.9 F (36.6 C)   TempSrc:   Oral   SpO2: 97% 97% 95% 94%  Weight:      Height:       General:  Appears calm and comfortable and is in NAD, on [place] O2 Eyes:  EOMI, normal lids, iris ENT:  grossly normal hearing, lips & tongue, mmm Cardiovascular:  RRR. 1+ LE edema.  Respiratory:  R > L basilar rhonchi.  Normal respiratory effort on [place] O2. Abdomen:  soft, NT, ND Skin:  no rash or induration  seen on limited exam Musculoskeletal:  grossly normal tone BUE/BLE, good ROM, no bony abnormality Psychiatric:  pleasantly confused mood and affect, speech sparse but appropriate, AOx1 Neurologic:  CN 2-12 grossly intact, moves all extremities in coordinated fashion   Radiological Exams on Admission: Independently reviewed - see discussion in A/P where applicable  CT Chest Wo Contrast Result Date: 10/30/2023 CLINICAL DATA:  Respiratory illness EXAM: CT CHEST WITHOUT CONTRAST TECHNIQUE: Multidetector CT imaging of the chest was performed following the standard protocol without IV contrast. RADIATION DOSE REDUCTION: This exam was performed according to the departmental dose-optimization program which includes automated  exposure control, adjustment of the mA and/or kV according to patient size and/or use of iterative reconstruction technique. COMPARISON:  CT chest abdomen and pelvis 08/14/2023. FINDINGS: Cardiovascular: Heart is mildly enlarged. There is no pericardial effusion. The aorta is normal in size. Mediastinum/Nodes: There is a small hiatal hernia. Esophagus is within normal limits. There is a hypodense right thyroid nodule measuring 10 mm. Enlarged subcarinal lymph node measures 14 mm, unchanged. No other enlarged lymph nodes are seen. There are calcified right hilar and subcarinal lymph nodes similar to prior. Lungs/Pleura: Patchy ground-glass and airspace opacities are seen throughout the bilateral lower lobes, right greater than left. There also mild ground-glass opacities in the bilateral upper lobes and right middle lobe. There is no pleural effusion or pneumothorax. Small slightly nodular 6 mm density seen along the posterior aspect of the distal trachea just above the bifurcation. Upper Abdomen: There is a 2.5 cm cyst in the left kidney. No acute abnormalities. Musculoskeletal: Degenerative changes affect the spine. IMPRESSION: 1. Patchy ground-glass and airspace opacities throughout the  bilateral lower lobes, right greater than left. Findings are concerning for multifocal pneumonia. 2. Small slightly nodular density along the posterior aspect of the distal trachea just above the bifurcation. Findings may be related to small focal lesions or secretions. Consider direct visualization. 3. Stable enlarged subcarinal lymph node. 4. Small hiatal hernia. 5. 1 cm incidental right thyroid nodule. No follow-up imaging recommended. Electronically Signed   By: Greig Pique M.D.   On: 10/30/2023 16:41   DG Chest Portable 1 View Result Date: 10/30/2023 CLINICAL DATA:  Cough. EXAM: PORTABLE CHEST 1 VIEW COMPARISON:  09/15/2023. FINDINGS: Cardiopericardial silhouette is at upper limits of normal for size. Linear densities in both lung bases suggest atelectasis or scarring. No overt edema or focal consolidative opacity. No substantial pleural effusion. No acute bony abnormality. IMPRESSION: Bibasilar atelectasis or scarring. Electronically Signed   By: Camellia Candle M.D.   On: 10/30/2023 12:51    EKG: Independently reviewed.  NSR with rate 61; nonspecific ST changes with no evidence of acute ischemia   Labs on Admission: I have personally reviewed the available labs and imaging studies at the time of the admission.  Pertinent labs:    Glucose 107 BUN 34/Creatinine 2.85/GFR 22, stable BNP 64 WBC 12.2 Hgb 11 COVID/RSV/flu negative Unremarkable UA Urine culture pending   Assessment and Plan: Principal Problem:   Multifocal pneumonia Active Problems:   GERD (gastroesophageal reflux disease)   CKD (chronic kidney disease) stage 4, GFR 15-29 ml/min (HCC)   Diabetes mellitus without complication (HCC)   Hypertension   Class 2 obesity   Metabolic encephalopathy    Multifocal PNA Patient presenting with productive cough, mildly decreased oxygen  saturation, and bibasilar infiltrates on chest CT This appears to be most likely community-acquired multifocal pneumonia No concern for  sepsis Influenza negative. COVID-19 negative. Blood cultures are pending Will order lower respiratory tract procalcitonin level.   >0.5 indicates infection and >>0.5 indicates more serious disease.  As the procalcitonin level normalizes, it will be reasonable to consider de-escalation of antibiotic coverage.  The sensitivity of procalcitonin is variable and should not be used alone to guide treatment. CURB-65 score is 3 based on BUN (CKD), age, and confusion Pneumonia Severity Index (PSI) is Class 4, 9% mortality. Will start Azithromycin  500 mg IV daily and Rocephin   Additional complicating factors include: hypoxia (mild); worsening of co-morbid conditions (dementia) LR @ 75cc/hr x 24 hours Repeat CBC in am Will add albuterol  PRN Will  add Mucinex  for cough  Acute metabolic encephalopathy Associated with above, superimposed on dementia Anticipate improvement with treatment of PNA Continue ASA  Dementia Advanced at baseline He is having more behavioral issues currently due to present illness Wife thinks he will need placement PT/OT/TOC team consults requested Continue donepezil , memantine , and trazodone  Hold folate and B12 for now  HTN Continue amlodipine , carvedilol , terazosin  Hold lasix , spironolactone   HLD Continue atorvastatin  Hold fish oil  for now  DM Last A1c was 7.5, reasonable control Continue Tresiba  Cover with sensitive-scale SSI Carb modified diet   GERD Continue Reglan , Protonix   Gout Continue allopurinol   Stage 4 CKD Appears to be stable at this time Attempt to avoid nephrotoxic medications Recheck BMP in AM  Hold Lasix , spironolactone  for now Continue bicarb  Class 2 obesity Body mass index is 36.77 kg/m.SABRA  Weight loss should be encouraged Outpatient PCP/bariatric medicine f/u encouraged Significantly low or high BMI is associated with higher medical risk including morbidity and mortality   DNR I have discussed code status with the family and  wife is in agreement that the patient would not desire resuscitation and would prefer to die a natural death should that situation arise. Patient will need a gold out of facility DNR form at the time of discharge       Advance Care Planning:   Code Status: Limited: Do not attempt resuscitation (DNR) -DNR-LIMITED -Do Not Intubate/DNI    Consults: PT, OT, TOC team  DVT Prophylaxis: Lovenox   Family Communication: Wife was present  Severity of Illness: The appropriate patient status for this patient is OBSERVATION. Observation status is judged to be reasonable and necessary in order to provide the required intensity of service to ensure the patient's safety. The patient's presenting symptoms, physical exam findings, and initial radiographic and laboratory data in the context of their medical condition is felt to place them at decreased risk for further clinical deterioration. Furthermore, it is anticipated that the patient will be medically stable for discharge from the hospital within 2 midnights of admission.   Author: Delon Herald, MD 10/30/2023 6:17 PM  For on call review [email or web address].

## 2023-10-30 NOTE — ED Triage Notes (Addendum)
 Pt's wife stated that pt is beginning to have a cough again, last time being caused by CHF and pneumonia. Pt's wife stated that pt will not take his medications for the issue. O2 in triage 90% on RAPt has Alzheimer's

## 2023-10-30 NOTE — ED Notes (Signed)
 Pt transported to CT ?

## 2023-10-30 NOTE — ED Notes (Signed)
 Pt drank 8oz of orange juice at this time.

## 2023-10-30 NOTE — ED Notes (Signed)
 Called SWOT nurse at this time for ultrasound IV.

## 2023-10-30 NOTE — ED Notes (Signed)
 Messaged PA at this time to notify of sugar dropping, juice provided and encouraged pt to eat crackers.

## 2023-10-30 NOTE — ED Notes (Signed)
 Lab at bedside

## 2023-10-30 NOTE — ED Notes (Signed)
 Pt transported back from CT.

## 2023-10-31 DIAGNOSIS — J189 Pneumonia, unspecified organism: Secondary | ICD-10-CM | POA: Diagnosis not present

## 2023-10-31 LAB — CBC
HCT: 31.4 % — ABNORMAL LOW (ref 39.0–52.0)
Hemoglobin: 9.7 g/dL — ABNORMAL LOW (ref 13.0–17.0)
MCH: 27.2 pg (ref 26.0–34.0)
MCHC: 30.9 g/dL (ref 30.0–36.0)
MCV: 88 fL (ref 80.0–100.0)
Platelets: 164 K/uL (ref 150–400)
RBC: 3.57 MIL/uL — ABNORMAL LOW (ref 4.22–5.81)
RDW: 15.5 % (ref 11.5–15.5)
WBC: 8.3 K/uL (ref 4.0–10.5)
nRBC: 0 % (ref 0.0–0.2)

## 2023-10-31 LAB — BASIC METABOLIC PANEL WITH GFR
Anion gap: 10 (ref 5–15)
BUN: 33 mg/dL — ABNORMAL HIGH (ref 8–23)
CO2: 24 mmol/L (ref 22–32)
Calcium: 8.5 mg/dL — ABNORMAL LOW (ref 8.9–10.3)
Chloride: 106 mmol/L (ref 98–111)
Creatinine, Ser: 2.54 mg/dL — ABNORMAL HIGH (ref 0.61–1.24)
GFR, Estimated: 25 mL/min — ABNORMAL LOW (ref >60)
Glucose, Bld: 119 mg/dL — ABNORMAL HIGH (ref 70–99)
Potassium: 4.2 mmol/L (ref 3.5–5.1)
Sodium: 140 mmol/L (ref 135–145)

## 2023-10-31 LAB — GLUCOSE, CAPILLARY
Glucose-Capillary: 115 mg/dL — ABNORMAL HIGH (ref 70–99)
Glucose-Capillary: 143 mg/dL — ABNORMAL HIGH (ref 70–99)
Glucose-Capillary: 159 mg/dL — ABNORMAL HIGH (ref 70–99)
Glucose-Capillary: 123 mg/dL — ABNORMAL HIGH (ref 70–99)
Glucose-Capillary: 55 mg/dL — ABNORMAL LOW (ref 70–99)

## 2023-10-31 MED ORDER — INSULIN GLARGINE 100 UNIT/ML ~~LOC~~ SOLN
30.0000 [IU] | Freq: Two times a day (BID) | SUBCUTANEOUS | Status: DC
Start: 2023-10-31 — End: 2023-11-01
  Administered 2023-10-31: 30 [IU] via SUBCUTANEOUS
  Filled 2023-10-31 (×5): qty 0.3

## 2023-10-31 NOTE — Evaluation (Signed)
 Occupational Therapy Evaluation Patient Details Name: Terry Stevens MRN: [id number] DOB: Oct 20, 1945 Today's Date: 10/31/2023   History of Present Illness   Terry Stevens is a 78 y.o. male with medical history significant of dementia, DM, and HTN who presented on 9/21 with cough and worsening confusion.  She reports that he started coughing earlier in the week and he has been more agitated than usual.  He usually takes his medications but he has been refusing, pocketing it.  He isn't eating/drinking.  He is sleeping more than usual.  He usually can get up and walk around but he needs more help with toileting, bathing, feeding himself.  She knows that it is time to place him; he recently obtained his Medicaid card and she thinks he will need LTC.  Tmax this AM, 98.8. (per MD)     Clinical Impressions Pt agreeable to OT and PT co-evaluation. Pt has baseline dementia. Wife present and providing history and baseline function. Pt required mod A for bed mobility and functional transfers with the RW. Pt is assisted for all ADL's at baseline by wife. Difficult to assess B UE strength/function due to cognitive deficits. Much time and labored movement for all tasks. Pt left in the chair with chair alarm set and call bell within reach. Pt will benefit from continued OT in the hospital to increase strength, balance, and endurance for safe ADL's.        If plan is discharge home, recommend the following:   A lot of help with walking and/or transfers;A lot of help with bathing/dressing/bathroom;Assistance with cooking/housework;Direct supervision/assist for medications management;Assistance with feeding;Assist for transportation;Help with stairs or ramp for entrance;Supervision due to cognitive status     Functional Status Assessment   Patient has had a recent decline in their functional status and demonstrates the ability to make significant improvements in function in a reasonable and predictable amount  of time.     Equipment Recommendations   None recommended by OT             Precautions/Restrictions   Precautions Precautions: Fall Recall of Precautions/Restrictions: Intact Restrictions Weight Bearing Restrictions Per Provider Order: No     Mobility Bed Mobility Overal bed mobility: Needs Assistance Bed Mobility: Supine to Sit     Supine to sit: Mod assist     General bed mobility comments: labored movement    Transfers Overall transfer level: Needs assistance Equipment used: Rolling walker (2 wheels) Transfers: Sit to/from Stand, Bed to chair/wheelchair/BSC Sit to Stand: Mod assist     Step pivot transfers: Mod assist     General transfer comment: Very slow and labored with much time and cuing. Pt does not use a RW at baseline but was unsteady without it today. EOB to BSC; BSC to bed; bed to chair all with RW. Unsteady with initial sit to stand without RW.      Balance Overall balance assessment: Needs assistance Sitting-balance support: No upper extremity supported, Feet supported Sitting balance-Leahy Scale: Fair Sitting balance - Comments: seated at EOB   Standing balance support: Bilateral upper extremity supported, During functional activity, Reliant on assistive device for balance Standing balance-Leahy Scale: Poor Standing balance comment: using RW; poor without RW                           ADL either performed or assessed with clinical judgement   ADL Overall ADL's : Needs assistance/impaired Eating/Feeding: Moderate assistance;Maximal assistance;Sitting   Grooming: Moderate  assistance;Maximal assistance;Sitting   Upper Body Bathing: Moderate assistance;Maximal assistance;Sitting   Lower Body Bathing: Maximal assistance;Total assistance;Sitting/lateral leans   Upper Body Dressing : Moderate assistance;Maximal assistance;Sitting   Lower Body Dressing: Maximal assistance;Total assistance;Sitting/lateral leans   Toilet  Transfer: Moderate assistance;Stand-pivot;Rolling walker (2 wheels);BSC/3in1 Toilet Transfer Details (indicate cue type and reason): EOB to [facility] with RW Toileting- Clothing Manipulation and Hygiene: Maximal assistance;Total assistance;Bed level       Functional mobility during ADLs: Moderate assistance;Rolling walker (2 wheels) General ADL Comments: Requires assist for cognitive deficits as well.     Vision Baseline Vision/History: 1 Wears glasses Ability to See in Adequate Light: 1 Impaired Patient Visual Report: No change from baseline Vision Assessment?: No apparent visual deficits     Perception Perception: Not tested       Praxis Praxis: Not tested       Pertinent Vitals/Pain Pain Assessment Pain Assessment: Faces Faces Pain Scale: No hurt     Extremity/Trunk Assessment Upper Extremity Assessment Upper Extremity Assessment: Difficult to assess due to impaired cognition (<50% A/ROM for shoulder flexion bilaterally. Difficult to assess fully given pt's cognition.)   Lower Extremity Assessment Lower Extremity Assessment: Defer to PT evaluation   Cervical / Trunk Assessment Cervical / Trunk Assessment: Normal   Communication Communication Communication: Other (comment) (soft spoken)   Cognition Arousal: Lethargic Behavior During Therapy: Flat affect Cognition: History of cognitive impairments             OT - Cognition Comments: Eyes closed at times during the session. Wife cuing pt to open his eyes multiple times. History of baseline dementia.                 Following commands: Intact       Cueing  General Comments   Cueing Techniques: Verbal cues;Tactile cues;Gestural cues                 Home Living Family/patient expects to be discharged to:: Private residence Living Arrangements: Spouse/significant other Available Help at Discharge: Family;Available PRN/intermittently Type of Home: House Home Access: Ramped entrance     Home  Layout: One level     Bathroom Shower/Tub: Producer, television/film/video: Standard (BSC over toilet) Bathroom Accessibility: Yes How Accessible: Accessible via walker Home Equipment: Rolling Walker (2 wheels);Cane - single point;BSC/3in1;Shower seat          Prior Functioning/Environment Prior Level of Function : Needs assist;History of Falls (last six months)  Cognitive Assist : ADLs (cognitive)   ADLs (Cognitive): Step by step cues Physical Assist : Mobility (physical);ADLs (physical) Mobility (physical): Transfers;Gait ADLs (physical): Feeding;Grooming;Bathing;Dressing;Toileting;IADLs Mobility Comments: Pt ambulates with PRN use of cane outside. Typically not ambulating with AD, but wife does assist at times with standing/mobility in general. ADLs Comments: Wife reports she assist with all ADL's and IADL's at home.    OT Problem List: Decreased strength;Decreased range of motion;Decreased activity tolerance;Impaired balance (sitting and/or standing);Decreased safety awareness   OT Treatment/Interventions: Self-care/ADL training;Therapeutic exercise;Neuromuscular education;Therapeutic activities;Patient/family education;Balance training;Cognitive remediation/compensation;DME and/or AE instruction      OT Goals(Current goals can be found in the care plan section)   Acute Rehab OT Goals Patient Stated Goal: Get more support for the pt. OT Goal Formulation: With family Time For Goal Achievement: 11/14/23 Potential to Achieve Goals: Fair   OT Frequency:  Min 2X/week    Co-evaluation PT/OT/SLP Co-Evaluation/Treatment: Yes Reason for Co-Treatment: To address functional/ADL transfers   OT goals addressed during session: ADL's and self-care  AM-PAC OT 6 Clicks Daily Activity     Outcome Measure Help from another person eating meals?: A Lot Help from another person taking care of personal grooming?: A Lot Help from another person toileting, which includes using  toliet, bedpan, or urinal?: Total Help from another person bathing (including washing, rinsing, drying)?: A Lot Help from another person to put on and taking off regular upper body clothing?: A Lot Help from another person to put on and taking off regular lower body clothing?: A Lot 6 Click Score: 11   End of Session Equipment Utilized During Treatment: Rolling walker (2 wheels);Gait belt Nurse Communication: Other (comment) (notified pt was in the chair)  Activity Tolerance: Patient tolerated treatment well Patient left: in chair;with call bell/phone within reach;with chair alarm set;with family/visitor present  OT Visit Diagnosis: Unsteadiness on feet (R26.81);Other abnormalities of gait and mobility (R26.89);Muscle weakness (generalized) (M62.81);History of falling (Z91.81)                Time: 8968-8940 OT Time Calculation (min): 28 min Charges:  OT General Charges $OT Visit: 1 Visit OT Evaluation $OT Eval Low Complexity: 1 Low  Argyle Gustafson OT, MOT   Jayson Person 10/31/2023, 1:34 PM

## 2023-10-31 NOTE — Plan of Care (Signed)
   Problem: Education: Goal: Knowledge of General Education information will improve Description Including pain rating scale, medication(s)/side effects and non-pharmacologic comfort measures Outcome: Progressing   Problem: Activity: Goal: Risk for activity intolerance will decrease Outcome: Progressing   Problem: Safety: Goal: Ability to remain free from injury will improve Outcome: Progressing

## 2023-10-31 NOTE — Plan of Care (Signed)
  Problem: Acute Rehab OT Goals (only OT should resolve) Goal: Pt. Will Perform Grooming Flowsheets (Taken 10/31/2023 1337) Pt Will Perform Grooming:  with supervision  sitting Goal: Pt. Will Perform Upper Body Dressing Flowsheets (Taken 10/31/2023 1337) Pt Will Perform Upper Body Dressing:  with contact guard assist  sitting Goal: Pt. Will Transfer To Toilet Flowsheets (Taken 10/31/2023 1337) Pt Will Transfer to Toilet:  with contact guard assist  ambulating Goal: Pt/Caregiver Will Perform Home Exercise Program Flowsheets (Taken 10/31/2023 1337) Pt/caregiver will Perform Home Exercise Program:  Increased strength  Increased ROM  Both right and left upper extremity  With minimal assist  Jennifer Holland OT, MOT

## 2023-10-31 NOTE — Evaluation (Signed)
 Clinical/Bedside Swallow Evaluation Patient Details  Name: Terry Stevens MRN: [id number] Date of Birth: [birth year]  Today's Date: 10/31/2023 Time: SLP Start Time (ACUTE ONLY): 1417 SLP Stop Time (ACUTE ONLY): 1438 SLP Time Calculation (min) (ACUTE ONLY): 21 min  Past Medical History:  Past Medical History:  Diagnosis Date   Arthritis    Constipation    Dementia (HCC)    Diabetes mellitus without complication (HCC)    GERD (gastroesophageal reflux disease)    tums prn   Gout    Hypertension    Osteoarthritis of left knee 02/27/2013   Past Surgical History:  Past Surgical History:  Procedure Laterality Date   CATARACT EXTRACTION W/PHACO Right 06/13/2020   Procedure: CATARACT EXTRACTION PHACO AND INTRAOCULAR LENS PLACEMENT RIGHT EYE;  Surgeon: Harrie Agent, MD;  Location: [facility];  Service: Ophthalmology;  Laterality: Right;  CDE  8.92   CATARACT EXTRACTION W/PHACO Left 06/27/2020   Procedure: CATARACT EXTRACTION PHACO AND INTRAOCULAR LENS PLACEMENT LEFT EYE;  Surgeon: Harrie Agent, MD;  Location: [facility];  Service: Ophthalmology;  Laterality: Left;  left,  CDE=8.21   ESOPHAGOGASTRODUODENOSCOPY (EGD) WITH PROPOFOL  N/A 02/21/2023   Procedure: ESOPHAGOGASTRODUODENOSCOPY (EGD) WITH PROPOFOL ;  Surgeon: Cindie Carlin POUR, DO;  Location: [facility];  Service: Endoscopy;  Laterality: N/A;   HOT HEMOSTASIS  02/21/2023   Procedure: HOT HEMOSTASIS (ARGON PLASMA COAGULATION/BICAP);  Surgeon: Cindie Carlin POUR, DO;  Location: [facility];  Service: Endoscopy;;   NO PAST SURGERIES     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 male with medical history significant of dementia, chronic diastolic heart failure, DM, and HTN and recent admission from 09/15/2023 -09/18/2023 for pneumonia and CHF exacerbation treated with antibiotics and diuretics presented with  altered mental status and cough.  On presentation, CT of the chest showed bibasilar/multifocal pneumonia.  He was started on Rocephin  and Zithromax . BSE ordered. Pt reportedly vomited earlier today and was given something for nausea per wife.    Assessment / Plan / Recommendation  Clinical Impression  Clinical swallow evaluation completed with wife present. Pt has dementia, but able to follow along with visual and verbal prompts as needed. His wife indicated that he threw up earlier today, but sounds like it was mostly mucous. Pt self presented straw sips water  with hand over hand cues for feeding and exhibited no overt sign or symptoms of aspiration and no reports of globus. This is also true for other textures and consistencies with the exception of mildly prolonged oral prep with crackers. Pt's wife indicates that he consumes regular textures at home and with cut up foods as needed. His intake in the hospital has been poor per wife. Recommend D3/mech soft for ease of mastication and bolus manipulation and thin liquids when Pt is alert, upright, and attending to task with caregiver assist. Continue medications crushed in puree and follow with liquid wash. No further SLP needs identified at this time. SLP will sign off. SLP Visit Diagnosis: Dysphagia, unspecified (R13.10)    Aspiration Risk  Mild aspiration risk    Diet Recommendation Dysphagia 3 (Mech soft);Thin liquid    Liquid Administration via: Cup;Straw Medication Administration: Crushed with puree Supervision: Patient able to self feed;Staff to assist with self feeding Compensations: Slow rate;Small sips/bites Postural Changes: Seated upright at 90 degrees;Remain upright for at least 30 minutes after po intake  Other  Recommendations Oral Care Recommendations: Oral care BID;Staff/trained caregiver to provide oral care     Assistance Recommended at Discharge    Functional Status Assessment Patient has not had a recent decline in  their functional status  Frequency and Duration            Prognosis Prognosis for improved oropharyngeal function: Good Barriers to Reach Goals: Cognitive deficits      Swallow Study   General Date of Onset: 10/30/23 HPI: 78 y.o. male with medical history significant of dementia, chronic diastolic heart failure, DM, and HTN and recent admission from 09/15/2023 -09/18/2023 for pneumonia and CHF exacerbation treated with antibiotics and diuretics presented with altered mental status and cough.  On presentation, CT of the chest showed bibasilar/multifocal pneumonia.  He was started on Rocephin  and Zithromax . BSE ordered. Pt reportedly vomited earlier today and was given something for nausea per wife. Type of Study: Bedside Swallow Evaluation Previous Swallow Assessment: July and August 2025 D2/thin Diet Prior to this Study: Regular Temperature Spikes Noted: No Respiratory Status: Room air History of Recent Intubation: No Behavior/Cognition: Alert;Cooperative;Requires cueing Oral Cavity Assessment: Within Functional Limits Oral Care Completed by SLP: Yes Oral Cavity - Dentition: Dentures, top;Dentures, bottom Vision: Functional for self-feeding Self-Feeding Abilities: Needs assist Patient Positioning: Upright in bed Baseline Vocal Quality: Normal Volitional Cough: Strong Volitional Swallow: Able to elicit    Oral/Motor/Sensory Function Overall Oral Motor/Sensory Function: Within functional limits   Ice Chips Ice chips: Within functional limits Presentation: Spoon   Thin Liquid Thin Liquid: Within functional limits Presentation: Cup;Self Fed;Straw    Nectar Thick Nectar Thick Liquid: Not tested   Honey Thick Honey Thick Liquid: Not tested   Puree Puree: Within functional limits Presentation: Spoon   Solid     Solid: Within functional limits Presentation: Self Fed Other Comments: min prolonged oral transit     Thank you,  Lamar Candy,  CCC-SLP [phone]  Zymere Patlan 10/31/2023,5:02 PM

## 2023-10-31 NOTE — Progress Notes (Signed)
 PROGRESS NOTE    Terry Stevens  FMW:[id number] DOB: 30-Sep-1945 DOA: 10/30/2023 PCP: Orpha Yancey LABOR, MD   Brief Narrative:  78 y.o. male with medical history significant of dementia, chronic diastolic heart failure, DM, and HTN and recent admission from 09/15/2023 -09/18/2023 for pneumonia and CHF exacerbation treated with antibiotics and diuretics presented with altered mental status and cough.  On presentation, CT of the chest showed bibasilar/multifocal pneumonia.  He was started on Rocephin  and Zithromax .  Assessment & Plan:   Multifocal pneumonia - CT of the chest as above.  Currently on room air.  RSV/flu/COVID PCR negative.  Blood cultures negative so far.  Procalcitonin elevated at 20 -Continue Rocephin  and Zithromax . - SLP evaluation. - Oxygen  supplementation if needed. - Continue as needed albuterol  and Mucinex   Acute metabolic encephalopathy History of dementia -Has advanced dementia at baseline.  Patient is having more behavioral issues currently due to present illness.  Wife thinks he will need placement. -Monitor mental status.  Follow questions.  PT/OT eval.  TOC consult. - Continue memantine , donepezil  and trazodone   Chronic diastolic heart failure - Echo on 09/16/2023 had shown EF of 65 to 70%.  Currently compensated.  Strict input and output.  Daily weights.  Fluid restriction.  Diuretics on hold.  Hypertension Hyperlipidemia -Continue amlodipine , Coreg , terazosin .  Lasix  and spironolactone  on hold. - Continue statin.  Fish oil  on hold for now.  Diabetes mellitus type 2 -continue CBGs with SSI and long-acting insulin .  Carb modified diet  Leukocytosis -Resolved  Anemia of chronic disease -From chronic illnesses.  Hemoglobin stable.  Monitor intermittently  Chronic kidney disease stage IV - Baseline creatinine of 2.3-2.6.  Creatinine currently at at baseline.  Monitor.  Physical deconditioning - PT eval  DVT prophylaxis: Lovenox  Code Status: DNR Family  Communication: Wife at bedside Disposition Plan: Status is: Observation The patient will require care spanning > 2 midnights and should be moved to inpatient because: Of severity of illness    Consultants: None  Procedures: None  Antimicrobials: Rocephin  and Zithromax  from 10/30/2023 onwards   Subjective: Patient seen and examined at bedside.  No fever, seizures, agitation reported.  Oral intake is very poor as per the wife at bedside.  Objective: Vitals:   10/30/23 1845 10/30/23 2209 10/31/23 0333 10/31/23 0603  BP: (!) 160/76 123/66 120/60 130/71  Pulse: 64 (!) 53 64 62  Resp: 18 16 16 18   Temp: 97.6 F (36.4 C) (!) 97.5 F (36.4 C) 98.5 F (36.9 C) 98.2 F (36.8 C)  TempSrc: Oral Oral Oral   SpO2: 90% 95% 93% 90%  Weight:      Height:        Intake/Output Summary (Last 24 hours) at 10/31/2023 0736 Last data filed at 10/31/2023 9394 Gross per 24 hour  Intake 966.63 ml  Output --  Net 966.63 ml   Filed Weights   10/30/23 1149  Weight: 106.5 kg    Examination:  General exam: Appears calm and comfortable.  Chronically ill and deconditioned appearing. Respiratory system: Bilateral decreased breath sounds at bases with scattered crackles Cardiovascular system: S1 & S2 heard, Rate controlled Gastrointestinal system: Abdomen is nondistended, soft and nontender. Normal bowel sounds heard. Extremities: No cyanosis, clubbing; bilateral lower extremity edema present Central nervous system: Awake but confused.  No focal neurological deficits. Moving extremities Skin: No rashes, lesions or ulcers Psychiatry: Flat affect.  Not agitated.  Data Reviewed: I have personally reviewed following labs and imaging studies  CBC: Recent Labs  Lab 10/30/23  1203 10/31/23 0426  WBC 12.2* 8.3  NEUTROABS 9.5*  --   HGB 11.0* 9.7*  HCT 34.0* 31.4*  MCV 87.6 88.0  PLT 202 164   Basic Metabolic Panel: Recent Labs  Lab 10/30/23 1203 10/31/23 0426  NA 140 140  K 4.3 4.2  CL  106 106  CO2 24 24  GLUCOSE 107* 119*  BUN 34* 33*  CREATININE 2.85* 2.54*  CALCIUM  8.6* 8.5*   GFR: Estimated Creatinine Clearance: 28.8 mL/min (A) (by C-G formula based on SCr of 2.54 mg/dL (H)). Liver Function Tests: No results for input(s): AST, ALT, ALKPHOS, BILITOT, PROT, ALBUMIN in the last 168 hours. No results for input(s): LIPASE, AMYLASE in the last 168 hours. No results for input(s): AMMONIA in the last 168 hours. Coagulation Profile: No results for input(s): INR, PROTIME in the last 168 hours. Cardiac Enzymes: No results for input(s): CKTOTAL, CKMB, CKMBINDEX, TROPONINI in the last 168 hours. BNP (last 3 results) No results for input(s): PROBNP in the last 8760 hours. HbA1C: No results for input(s): HGBA1C in the last 72 hours. CBG: Recent Labs  Lab 10/30/23 1725 10/30/23 2210 10/31/23 0729  GLUCAP 73 116* 143*   Lipid Profile: No results for input(s): CHOL, HDL, LDLCALC, TRIG, CHOLHDL, LDLDIRECT in the last 72 hours. Thyroid Function Tests: No results for input(s): TSH, T4TOTAL, FREET4, T3FREE, THYROIDAB in the last 72 hours. Anemia Panel: No results for input(s): VITAMINB12, FOLATE, FERRITIN, TIBC, IRON, RETICCTPCT in the last 72 hours. Sepsis Labs: Recent Labs  Lab 10/30/23 1203 10/30/23 1736  PROCALCITON 20.00  --   LATICACIDVEN  --  0.7    Recent Results (from the past 240 hours)  Resp panel by RT-PCR (RSV, Flu A&B, Covid) Anterior Nasal Swab     Status: None   Collection Time: 10/30/23  2:52 PM   Specimen: Anterior Nasal Swab  Result Value Ref Range Status   SARS Coronavirus 2 by RT PCR NEGATIVE NEGATIVE Final    Comment: (NOTE) SARS-CoV-2 target nucleic acids are NOT DETECTED.  The SARS-CoV-2 RNA is generally detectable in upper respiratory specimens during the acute phase of infection. The lowest concentration of SARS-CoV-2 viral copies this assay can detect is 138  copies/mL. A negative result does not preclude SARS-Cov-2 infection and should not be used as the sole basis for treatment or other patient management decisions. A negative result may occur with  improper specimen collection/handling, submission of specimen other than nasopharyngeal swab, presence of viral mutation(s) within the areas targeted by this assay, and inadequate number of viral copies(<138 copies/mL). A negative result must be combined with clinical observations, patient history, and epidemiological information. The expected result is Negative.  Fact Sheet for Patients:  BloggerCourse.com  Fact Sheet for Healthcare Providers:  SeriousBroker.it  This test is no t yet approved or cleared by the [place]  FDA and  has been authorized for detection and/or diagnosis of SARS-CoV-2 by FDA under an Emergency Use Authorization (EUA). This EUA will remain  in effect (meaning this test can be used) for the duration of the COVID-19 declaration under Section 564(b)(1) of the Act, 21 U.S.C.section 360bbb-3(b)(1), unless the authorization is terminated  or revoked sooner.       Influenza A by PCR NEGATIVE NEGATIVE Final   Influenza B by PCR NEGATIVE NEGATIVE Final    Comment: (NOTE) The Xpert Xpress SARS-CoV-2/FLU/RSV plus assay is intended as an aid in the diagnosis of influenza from Nasopharyngeal swab specimens and should not be used as a sole  basis for treatment. Nasal washings and aspirates are unacceptable for Xpert Xpress SARS-CoV-2/FLU/RSV testing.  Fact Sheet for Patients: BloggerCourse.com  Fact Sheet for Healthcare Providers: SeriousBroker.it  This test is not yet approved or cleared by the [place]  FDA and has been authorized for detection and/or diagnosis of SARS-CoV-2 by FDA under an Emergency Use Authorization (EUA). This EUA will remain in effect (meaning  this test can be used) for the duration of the COVID-19 declaration under Section 564(b)(1) of the Act, 21 U.S.C. section 360bbb-3(b)(1), unless the authorization is terminated or revoked.     Resp Syncytial Virus by PCR NEGATIVE NEGATIVE Final    Comment: (NOTE) Fact Sheet for Patients: BloggerCourse.com  Fact Sheet for Healthcare Providers: SeriousBroker.it  This test is not yet approved or cleared by the [place]  FDA and has been authorized for detection and/or diagnosis of SARS-CoV-2 by FDA under an Emergency Use Authorization (EUA). This EUA will remain in effect (meaning this test can be used) for the duration of the COVID-19 declaration under Section 564(b)(1) of the Act, 21 U.S.C. section 360bbb-3(b)(1), unless the authorization is terminated or revoked.  Performed at [facility], [address], [place], [place] [address]   Blood culture (routine x 2)     Status: None (Preliminary result)   Collection Time: 10/30/23  5:36 PM   Specimen: Right Antecubital; Blood  Result Value Ref Range Status   Specimen Description RIGHT ANTECUBITAL  Final   Special Requests   Final    BOTTLES DRAWN AEROBIC AND ANAEROBIC Blood Culture adequate volume Performed at [facility], [address] [place], [place] [address]    Culture PENDING  Incomplete   Report Status PENDING  Incomplete  Blood culture (routine x 2)     Status: None (Preliminary result)   Collection Time: 10/30/23  5:36 PM   Specimen: Left Antecubital; Blood  Result Value Ref Range Status   Specimen Description LEFT ANTECUBITAL  Final   Special Requests   Final    BOTTLES DRAWN AEROBIC AND ANAEROBIC Blood Culture adequate volume Performed at [facility], [address], [place], [place] [address]    Culture PENDING  Incomplete   Report Status PENDING  Incomplete         Radiology Studies: CT Chest Wo Contrast Result Date: 10/30/2023 CLINICAL DATA:   Respiratory illness EXAM: CT CHEST WITHOUT CONTRAST TECHNIQUE: Multidetector CT imaging of the chest was performed following the standard protocol without IV contrast. RADIATION DOSE REDUCTION: This exam was performed according to the departmental dose-optimization program which includes automated exposure control, adjustment of the mA and/or kV according to patient size and/or use of iterative reconstruction technique. COMPARISON:  CT chest abdomen and pelvis 08/14/2023. FINDINGS: Cardiovascular: Heart is mildly enlarged. There is no pericardial effusion. The aorta is normal in size. Mediastinum/Nodes: There is a small hiatal hernia. Esophagus is within normal limits. There is a hypodense right thyroid nodule measuring 10 mm. Enlarged subcarinal lymph node measures 14 mm, unchanged. No other enlarged lymph nodes are seen. There are calcified right hilar and subcarinal lymph nodes similar to prior. Lungs/Pleura: Patchy ground-glass and airspace opacities are seen throughout the bilateral lower lobes, right greater than left. There also mild ground-glass opacities in the bilateral upper lobes and right middle lobe. There is no pleural effusion or pneumothorax. Small slightly nodular 6 mm density seen along the posterior aspect of the distal trachea just above the bifurcation. Upper Abdomen: There is a 2.5 cm cyst in the left kidney. No  acute abnormalities. Musculoskeletal: Degenerative changes affect the spine. IMPRESSION: 1. Patchy ground-glass and airspace opacities throughout the bilateral lower lobes, right greater than left. Findings are concerning for multifocal pneumonia. 2. Small slightly nodular density along the posterior aspect of the distal trachea just above the bifurcation. Findings may be related to small focal lesions or secretions. Consider direct visualization. 3. Stable enlarged subcarinal lymph node. 4. Small hiatal hernia. 5. 1 cm incidental right thyroid nodule. No follow-up imaging recommended.  Electronically Signed   By: Greig Pique M.D.   On: 10/30/2023 16:41   DG Chest Portable 1 View Result Date: 10/30/2023 CLINICAL DATA:  Cough. EXAM: PORTABLE CHEST 1 VIEW COMPARISON:  09/15/2023. FINDINGS: Cardiopericardial silhouette is at upper limits of normal for size. Linear densities in both lung bases suggest atelectasis or scarring. No overt edema or focal consolidative opacity. No substantial pleural effusion. No acute bony abnormality. IMPRESSION: Bibasilar atelectasis or scarring. Electronically Signed   By: Camellia Candle M.D.   On: 10/30/2023 12:51        Scheduled Meds:  allopurinol   100 mg Oral Daily   amLODipine   5 mg Oral Daily   aspirin  EC  81 mg Oral Daily   atorvastatin   20 mg Oral QHS   carvedilol   25 mg Oral BID   docusate sodium   100 mg Oral BID   donepezil   10 mg Oral QPM   enoxaparin  (LOVENOX ) injection  30 mg Subcutaneous Q24H   insulin  aspart  0-9 Units Subcutaneous TID WC   insulin  degludec  30 Units Subcutaneous BID   memantine   10 mg Oral BID   metoCLOPramide   5 mg Oral TID   pantoprazole   40 mg Oral BID   sodium bicarbonate   650 mg Oral BID   terazosin   5 mg Oral QHS   traZODone   100 mg Oral QHS   Continuous Infusions:  sodium chloride  75 mL/hr at 10/31/23 0323   azithromycin  Stopped (10/30/23 2104)   cefTRIAXone  (ROCEPHIN )  IV            Sophie Mao, MD [facility] 10/31/2023, 7:36 AM
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
## 2023-10-31 NOTE — Evaluation (Signed)
 Physical Therapy Evaluation Patient Details Name: Terry Stevens MRN: [id number] DOB: [birth year] Today's Date: 10/31/2023  History of Present Illness  Terry Stevens is a 78 y.o. male with medical history significant of dementia, DM, and HTN who presented on 9/21 with cough and worsening confusion.  She reports that he started coughing earlier in the week and he has been more agitated than usual.  He usually takes his medications but he has been refusing, pocketing it.  He isn't eating/drinking.  He is sleeping more than usual.  He usually can get up and walk around but he needs more help with toileting, bathing, feeding himself.  She knows that it is time to place him; he recently obtained his Medicaid card and she thinks he will need LTC.  Tmax this AM, 98.8.   Clinical Impression  Pt was agreeable to PT and OT co-evaluation.  Wife was present during the evaluation to proved his history and prior level of function. He does not utilize a RW at baseline. However, he exhibited significant unsteadiness when attempting to stand without the RW. He required modA with ambulation and transfers to safely utilize the RW. Pt was left in the chair with the call bell within reach, chair alarm set, and wife present. Patient will benefit from continued skilled physical therapy in hospital and recommended venue below to increase strength, balance, endurance for safe ADLs and gait.       If plan is discharge home, recommend the following: A lot of help with walking and/or transfers;A lot of help with bathing/dressing/bathroom;Assistance with cooking/housework;Assist for transportation;Supervision due to cognitive status;Help with stairs or ramp for entrance;Direct supervision/assist for medications management   Can travel by private vehicle   Yes    Equipment Recommendations None recommended by PT  Recommendations for Other Services       Functional Status Assessment Patient has had a recent decline in their  functional status and demonstrates the ability to make significant improvements in function in a reasonable and predictable amount of time.     Precautions / Restrictions Precautions Precautions: Fall Recall of Precautions/Restrictions: Intact Restrictions Weight Bearing Restrictions Per Provider Order: No      Mobility  Bed Mobility Overal bed mobility: Needs Assistance Bed Mobility: Supine to Sit     Supine to sit: Mod assist     General bed mobility comments: labored movement    Transfers Overall transfer level: Needs assistance Equipment used: Rolling walker (2 wheels) Transfers: Sit to/from Stand, Bed to chair/wheelchair/BSC Sit to Stand: Mod assist   Step pivot transfers: Mod assist       General transfer comment: Very slow and labored with much time and cuing. Pt does not use a RW at baseline but was unsteady without it today. EOB to BSC; BSC to bed; bed to chair all with RW. Unsteady with initial sit to stand without RW.    Ambulation/Gait Ambulation/Gait assistance: Mod assist Gait Distance (Feet): 4 Feet Assistive device: Rolling walker (2 wheels) Gait Pattern/deviations: Step-to pattern, Decreased stride length, Shuffle Gait velocity: slow        Stairs            Wheelchair Mobility     Tilt Bed    Modified Rankin (Stroke Patients Only)       Balance Overall balance assessment: Needs assistance Sitting-balance support: No upper extremity supported, Feet supported Sitting balance-Leahy Scale: Fair Sitting balance - Comments: seated at EOB   Standing balance support: Bilateral upper extremity supported, During functional activity,  Reliant on assistive device for balance Standing balance-Leahy Scale: Poor Standing balance comment: using RW; poor without RW                             Pertinent Vitals/Pain Pain Assessment Pain Assessment: Faces Faces Pain Scale: No hurt    Home Living Family/patient expects to be  discharged to:: Private residence Living Arrangements: Spouse/significant other Available Help at Discharge: Family;Available PRN/intermittently Type of Home: House Home Access: Ramped entrance       Home Layout: One level Home Equipment: Agricultural consultant (2 wheels);Cane - single point;BSC/3in1;Shower seat      Prior Function Prior Level of Function : Needs assist;History of Falls (last six months)  Cognitive Assist : ADLs (cognitive)   ADLs (Cognitive): Step by step cues Physical Assist : Mobility (physical);ADLs (physical) Mobility (physical): Transfers;Gait ADLs (physical): Feeding;Grooming;Bathing;Dressing;Toileting;IADLs Mobility Comments: Pt ambulates with PRN use of cane outside. Typically not ambulating with AD, but wife does assist at times with standing/mobility in general. ADLs Comments: Wife reports she assist with all ADL's and IADL's at home.     Extremity/Trunk Assessment   Upper Extremity Assessment Upper Extremity Assessment: Defer to OT evaluation    Lower Extremity Assessment Lower Extremity Assessment: Generalized weakness    Cervical / Trunk Assessment Cervical / Trunk Assessment: Normal  Communication   Communication Communication: Other (comment) (soft spoken)    Cognition Arousal: Lethargic Behavior During Therapy: Flat affect   PT - Cognitive impairments: Difficult to assess Difficult to assess due to: Level of arousal                       Following commands: Intact       Cueing Cueing Techniques: Verbal cues, Tactile cues, Gestural cues     General Comments      Exercises     Assessment/Plan    PT Assessment Patient needs continued PT services  PT Problem List Decreased strength;Decreased activity tolerance;Decreased balance;Decreased mobility       PT Treatment Interventions DME instruction;Gait training;Stair training;Functional mobility training;Therapeutic activities;Therapeutic exercise;Balance  training;Patient/family education    PT Goals (Current goals can be found in the Care Plan section)  Acute Rehab PT Goals Patient Stated Goal: pt would like to go to rehab to get stronger. PT Goal Formulation: With patient/family Time For Goal Achievement: 11/14/23 Potential to Achieve Goals: Good    Frequency Min 3X/week     Co-evaluation PT/OT/SLP Co-Evaluation/Treatment: Yes Reason for Co-Treatment: To address functional/ADL transfers PT goals addressed during session: Mobility/safety with mobility;Proper use of DME;Balance OT goals addressed during session: ADL's and self-care       AM-PAC PT 6 Clicks Mobility  Outcome Measure Help needed turning from your back to your side while in a flat bed without using bedrails?: A Little Help needed moving from lying on your back to sitting on the side of a flat bed without using bedrails?: A Little Help needed moving to and from a bed to a chair (including a wheelchair)?: A Lot Help needed standing up from a chair using your arms (e.g., wheelchair or bedside chair)?: A Lot Help needed to walk in hospital room?: A Lot Help needed climbing 3-5 steps with a railing? : Total 6 Click Score: 13    End of Session Equipment Utilized During Treatment: Gait belt Activity Tolerance: Patient limited by lethargy Patient left: in chair;with call bell/phone within reach;with chair alarm set;with family/visitor present  PT Visit Diagnosis: Unsteadiness on feet (R26.81);Other abnormalities of gait and mobility (R26.89);Repeated falls (R29.6)    Time: 8968-8940 PT Time Calculation (min) (ACUTE ONLY): 28 min   Charges:   PT Evaluation $PT Eval Moderate Complexity: 1 Mod PT Treatments $Therapeutic Activity: 8-22 mins PT General Charges $$ ACUTE PT VISIT: 1 Visit         Lacinda Fass, PT, DPT  10/31/2023, 2:31 PM

## 2023-10-31 NOTE — Progress Notes (Signed)
 Mobility Specialist Progress Note:    10/31/23 1345  Mobility  Activity Pivoted/transferred from chair to bed  Level of Assistance Moderate assist, patient does 50-74%  Assistive Device None  Distance Ambulated (ft) 3 ft  Range of Motion/Exercises Active;All extremities  Activity Response Tolerated well  Mobility Referral Yes  Mobility visit 1 Mobility  Mobility Specialist Start Time (ACUTE ONLY) 1345  Mobility Specialist Stop Time (ACUTE ONLY) 1402  Mobility Specialist Time Calculation (min) (ACUTE ONLY) 17 min   Pt received in chair, NT requesting assistance transferring to bed. Required ModA with no AD to stand and transfer. Tolerated well, required verbal cues to lift and move legs. NT and wife in room, all needs met.  Terry Stevens Mobility Specialist Please contact via Special educational needs teacher or  Rehab office at [phone]

## 2023-10-31 NOTE — Plan of Care (Signed)
  Problem: Acute Rehab PT Goals(only PT should resolve) Goal: Pt Will Go Supine/Side To Sit Outcome: Progressing Flowsheets (Taken 10/31/2023 1437) Pt will go Supine/Side to Sit: with minimal assist Goal: Patient Will Transfer Sit To/From Stand Outcome: Progressing Flowsheets (Taken 10/31/2023 1437) Patient will transfer sit to/from stand: with minimal assist Goal: Pt Will Ambulate Outcome: Progressing Flowsheets (Taken 10/31/2023 1437) Pt will Ambulate:  10 feet  with minimal assist  with rolling walker   Lacinda Fass, PT, DPT
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
## 2023-11-01 DIAGNOSIS — J189 Pneumonia, unspecified organism: Secondary | ICD-10-CM | POA: Diagnosis not present

## 2023-11-01 LAB — CBC WITH DIFFERENTIAL/PLATELET
Abs Immature Granulocytes: 0.04 K/uL (ref 0.00–0.07)
Basophils Absolute: 0 K/uL (ref 0.0–0.1)
Basophils Relative: 0 %
Eosinophils Absolute: 0.1 K/uL (ref 0.0–0.5)
Eosinophils Relative: 1 %
HCT: 31.3 % — ABNORMAL LOW (ref 39.0–52.0)
Hemoglobin: 9.5 g/dL — ABNORMAL LOW (ref 13.0–17.0)
Immature Granulocytes: 1 %
Lymphocytes Relative: 23 %
Lymphs Abs: 1.8 K/uL (ref 0.7–4.0)
MCH: 27.2 pg (ref 26.0–34.0)
MCHC: 30.4 g/dL (ref 30.0–36.0)
MCV: 89.7 fL (ref 80.0–100.0)
Monocytes Absolute: 0.5 K/uL (ref 0.1–1.0)
Monocytes Relative: 7 %
Neutro Abs: 5.4 K/uL (ref 1.7–7.7)
Neutrophils Relative %: 68 %
Platelets: 189 K/uL (ref 150–400)
RBC: 3.49 MIL/uL — ABNORMAL LOW (ref 4.22–5.81)
RDW: 15.5 % (ref 11.5–15.5)
WBC: 7.9 K/uL (ref 4.0–10.5)
nRBC: 0 % (ref 0.0–0.2)

## 2023-11-01 LAB — BASIC METABOLIC PANEL WITH GFR
Anion gap: 12 (ref 5–15)
BUN: 28 mg/dL — ABNORMAL HIGH (ref 8–23)
CO2: 24 mmol/L (ref 22–32)
Calcium: 8.7 mg/dL — ABNORMAL LOW (ref 8.9–10.3)
Chloride: 104 mmol/L (ref 98–111)
Creatinine, Ser: 2.36 mg/dL — ABNORMAL HIGH (ref 0.61–1.24)
GFR, Estimated: 28 mL/min — ABNORMAL LOW (ref >60)
Glucose, Bld: 98 mg/dL (ref 70–99)
Potassium: 4.7 mmol/L (ref 3.5–5.1)
Sodium: 140 mmol/L (ref 135–145)

## 2023-11-01 LAB — GLUCOSE, CAPILLARY
Glucose-Capillary: 106 mg/dL — ABNORMAL HIGH (ref 70–99)
Glucose-Capillary: 120 mg/dL — ABNORMAL HIGH (ref 70–99)
Glucose-Capillary: 132 mg/dL — ABNORMAL HIGH (ref 70–99)
Glucose-Capillary: 144 mg/dL — ABNORMAL HIGH (ref 70–99)
Glucose-Capillary: 152 mg/dL — ABNORMAL HIGH (ref 70–99)
Glucose-Capillary: 213 mg/dL — ABNORMAL HIGH (ref 70–99)
Glucose-Capillary: 62 mg/dL — ABNORMAL LOW (ref 70–99)

## 2023-11-01 LAB — MAGNESIUM: Magnesium: 1.7 mg/dL (ref 1.7–2.4)

## 2023-11-01 MED ORDER — DEXTROSE 50 % IV SOLN
12.5000 g | INTRAVENOUS | Status: AC
Start: 2023-11-01 — End: 2023-11-01
  Administered 2023-11-01: 12.5 g via INTRAVENOUS
  Filled 2023-11-01: qty 50

## 2023-11-01 MED ORDER — INSULIN GLARGINE 100 UNIT/ML ~~LOC~~ SOLN
10.0000 [IU] | Freq: Two times a day (BID) | SUBCUTANEOUS | Status: DC
  Administered 2023-11-01 – 2023-11-03 (×4): 10 [IU] via SUBCUTANEOUS
  Filled 2023-11-01 (×7): qty 0.1

## 2023-11-01 NOTE — Progress Notes (Signed)
 Physical Therapy Treatment Patient Details Name: Terry Stevens MRN: [id number] DOB: 1945-09-03 Today's Date: 11/01/2023   History of Present Illness Terry Stevens is a 78 y.o. male with medical history significant of dementia, DM, and HTN who presented on 9/21 with cough and worsening confusion.  She reports that he started coughing earlier in the week and he has been more agitated than usual.  He usually takes his medications but he has been refusing, pocketing it.  He isn't eating/drinking.  He is sleeping more than usual.  He usually can get up and walk around but he needs more help with toileting, bathing, feeding himself.  She knows that it is time to place him; he recently obtained his Medicaid card and she thinks he will need LTC.  Tmax this AM, 98.8.    PT Comments  Pt was received seated EOB after being assisted from chair to EOB by mobility technician and nursing staff. Today's treatment was significantly limited by his cognitive status as he required constant multimodal cueing to maintain focus on his current task. He was able to complete limited repetitions due to his cognitive status. He was left in bed with the call bell within reach and the bed alarm set. Patient will benefit from continued skilled physical therapy in hospital and recommended venue below to increase strength, balance, endurance for safe ADLs and gait.     If plan is discharge home, recommend the following: A lot of help with walking and/or transfers;A lot of help with bathing/dressing/bathroom;Assistance with cooking/housework;Assist for transportation;Supervision due to cognitive status;Help with stairs or ramp for entrance;Direct supervision/assist for medications management   Can travel by private vehicle     Yes  Equipment Recommendations  None recommended by PT    Recommendations for Other Services       Precautions / Restrictions Precautions Precautions: Fall Recall of Precautions/Restrictions:  Impaired Restrictions Weight Bearing Restrictions Per Provider Order: No     Mobility  Bed Mobility Overal bed mobility: Needs Assistance Bed Mobility: Sit to Supine       Sit to supine: Max assist, HOB elevated   General bed mobility comments: labored movement; pt lethargic    Transfers                        Ambulation/Gait                   Stairs             Wheelchair Mobility     Tilt Bed    Modified Rankin (Stroke Patients Only)       Balance Overall balance assessment: Needs assistance Sitting-balance support: Bilateral upper extremity supported, Feet supported Sitting balance-Leahy Scale: Fair Sitting balance - Comments: seated at EOB                                    Communication Communication Communication: Other (comment) (soft spoken)  Cognition Arousal: Lethargic, Obtunded Behavior During Therapy: Flat affect   PT - Cognitive impairments: History of cognitive impairments Difficult to assess due to: Level of arousal                     PT - Cognition Comments: Pt was lethargic at EOB, but progressed to obtunded once sit to supine transfer was compled Following commands: Impaired Following commands impaired: Follows one step commands inconsistently, Follows one step  commands with increased time    Cueing Cueing Techniques: Verbal cues, Tactile cues, Gestural cues, Visual cues  Exercises General Exercises - Lower Extremity Ankle Circles/Pumps: Both, 5 reps, Supine Quad Sets: Other (comment) (attempted) Long Arc Quad: Both, 5 reps, Seated Straight Leg Raises: Other (comment) (attempted)    General Comments        Pertinent Vitals/Pain Pain Assessment Pain Assessment: Faces Faces Pain Scale: No hurt    Home Living                          Prior Function            PT Goals (current goals can now be found in the care plan section) Acute Rehab PT Goals Patient Stated  Goal: pt would like to go to rehab to get stronger. PT Goal Formulation: With patient/family Time For Goal Achievement: 11/14/23 Potential to Achieve Goals: Good Progress towards PT goals: Progressing toward goals    Frequency    Min 3X/week      PT Plan      Co-evaluation              AM-PAC PT 6 Clicks Mobility   Outcome Measure  Help needed turning from your back to your side while in a flat bed without using bedrails?: A Lot Help needed moving from lying on your back to sitting on the side of a flat bed without using bedrails?: A Lot Help needed moving to and from a bed to a chair (including a wheelchair)?: A Lot Help needed standing up from a chair using your arms (e.g., wheelchair or bedside chair)?: A Lot Help needed to walk in hospital room?: A Lot Help needed climbing 3-5 steps with a railing? : Total 6 Click Score: 11    End of Session   Activity Tolerance: Patient limited by lethargy Patient left: in bed;with call bell/phone within reach;with bed alarm set   PT Visit Diagnosis: Unsteadiness on feet (R26.81);Other abnormalities of gait and mobility (R26.89);Repeated falls (R29.6)     Time: 1420-1430 PT Time Calculation (min) (ACUTE ONLY): 10 min  Charges:    $Therapeutic Exercise: 8-22 mins PT General Charges $$ ACUTE PT VISIT: 1 Visit                     Lacinda Fass, PT, DPT  11/01/2023, 3:05 PM

## 2023-11-01 NOTE — Progress Notes (Signed)
 Mobility Specialist Progress Note:    11/01/23 1405  Mobility  Activity Pivoted/transferred from chair to bed  Level of Assistance Maximum assist, patient does 25-49% (+2)  Assistive Device Front wheel walker  Distance Ambulated (ft) 3 ft  Range of Motion/Exercises Active;All extremities  Activity Response Tolerated well  Mobility Referral Yes  Mobility visit 1 Mobility  Mobility Specialist Start Time (ACUTE ONLY) 1405  Mobility Specialist Stop Time (ACUTE ONLY) 1420  Mobility Specialist Time Calculation (min) (ACUTE ONLY) 15 min   Pt received in chair, NT requesting assistance transferring to chair. Required MaxA+2 to stand and transfer with no AD. Tolerated well,required verbal and hands-on cues during transfer. Physical therapy in room, all needs met.  Colbe Viviano Mobility Specialist Please contact via Special educational needs teacher or  Rehab office at [phone]

## 2023-11-01 NOTE — Plan of Care (Signed)
   Problem: Education: Goal: Knowledge of General Education information will improve Description Including pain rating scale, medication(s)/side effects and non-pharmacologic comfort measures Outcome: Progressing   Problem: Health Behavior/Discharge Planning: Goal: Ability to manage health-related needs will improve Outcome: Progressing

## 2023-11-01 NOTE — Progress Notes (Signed)
 Occupational Therapy Treatment Patient Details Name: Terry Stevens MRN: [id number] DOB: 12/30/1945 Today's Date: 11/01/2023   History of present illness Terry Stevens is a 78 y.o. male with medical history significant of dementia, DM, and HTN who presented on 9/21 with cough and worsening confusion.  She reports that he started coughing earlier in the week and he has been more agitated than usual.  He usually takes his medications but he has been refusing, pocketing it.  He isn't eating/drinking.  He is sleeping more than usual.  He usually can get up and walk around but he needs more help with toileting, bathing, feeding himself.  She knows that it is time to place him; he recently obtained his Medicaid card and she thinks he will need LTC.  Tmax this AM, 98.8.   OT comments  Pt lethargic but able to open his eyes at times with stimulation to chest and calling out of his name. Pt required max A for bed mobility followed by mod A for sit to stand with RW. Mod to max A for steps to chair due to labored movement and difficulty managing RW without assist. Once in the chair pt was able to partially wash his face using L UE. Pt left in the chair with chair alarm set and NT in the room. Pt will benefit from continued OT in the hospital to increase strength, balance, and endurance for safe ADL's.         If plan is discharge home, recommend the following:  A lot of help with walking and/or transfers;A lot of help with bathing/dressing/bathroom;Assistance with cooking/housework;Direct supervision/assist for medications management;Assistance with feeding;Assist for transportation;Help with stairs or ramp for entrance;Supervision due to cognitive status   Equipment Recommendations  None recommended by OT          Precautions / Restrictions Precautions Precautions: Fall Recall of Precautions/Restrictions: Impaired Restrictions Weight Bearing Restrictions Per Provider Order: No       Mobility Bed  Mobility Overal bed mobility: Needs Assistance Bed Mobility: Supine to Sit     Supine to sit: HOB elevated, Used rails, Max assist     General bed mobility comments: labored movement; pt lethargic    Transfers Overall transfer level: Needs assistance Equipment used: Rolling walker (2 wheels) Transfers: Sit to/from Stand, Bed to chair/wheelchair/BSC Sit to Stand: Mod assist     Step pivot transfers: Mod assist, Max assist     General transfer comment: labored; much assist to step to chair and manage RW.     Balance Overall balance assessment: Needs assistance Sitting-balance support: Bilateral upper extremity supported, Feet supported Sitting balance-Leahy Scale: Fair Sitting balance - Comments: seated at EOB   Standing balance support: Bilateral upper extremity supported, During functional activity, Reliant on assistive device for balance Standing balance-Leahy Scale: Poor Standing balance comment: poor with RW                           ADL either performed or assessed with clinical judgement   ADL Overall ADL's : Needs assistance/impaired     Grooming: Set up;Minimal assistance;Sitting Grooming Details (indicate cue type and reason): Pt able to partially wash his face with a warm washcoth using L UE seated in the recliner.                                     Communication Communication Communication:  Other (comment) (soft spoken)   Cognition Arousal: Lethargic Behavior During Therapy: Flat affect Cognition: History of cognitive impairments             OT - Cognition Comments: Pt lethargic but able to open his eyes at times today. No family present today.                 Following commands: Impaired Following commands impaired: Follows one step commands inconsistently, Follows one step commands with increased time      Cueing   Cueing Techniques: Verbal cues, Tactile cues  Exercises                   Pertinent  Vitals/ Pain       Pain Assessment Pain Assessment: Faces Faces Pain Scale: Hurts a little bit Pain Location: back Pain Descriptors / Indicators: Discomfort Pain Intervention(s): Monitored during session, Repositioned                                                          Frequency  Min 2X/week        Progress Toward Goals  OT Goals(current goals can now be found in the care plan section)  Progress towards OT goals: Progressing toward goals  Acute Rehab OT Goals Patient Stated Goal: Get more support for the pt. OT Goal Formulation: With family Time For Goal Achievement: 11/14/23 Potential to Achieve Goals: Fair ADL Goals Pt Will Perform Grooming: with supervision;sitting Pt Will Perform Upper Body Dressing: with contact guard assist;sitting Pt Will Transfer to Toilet: with contact guard assist;ambulating Pt/caregiver will Perform Home Exercise Program: Increased strength;Increased ROM;Both right and left upper extremity;With minimal assist  Plan                                      End of Session Equipment Utilized During Treatment: Rolling walker (2 wheels);Gait belt;Oxygen   OT Visit Diagnosis: Unsteadiness on feet (R26.81);Other abnormalities of gait and mobility (R26.89);Muscle weakness (generalized) (M62.81);History of falling (Z91.81)   Activity Tolerance Patient limited by lethargy   Patient Left in chair;with call bell/phone within reach;with chair alarm set   Nurse Communication Other (comment) (NT entered room to see the pt in the chair.)        Time: 8891-8878 OT Time Calculation (min): 13 min  Charges: OT General Charges $OT Visit: 1 Visit OT Treatments $Therapeutic Activity: 8-22 mins  Terry Stevens OT, MOT   Terry Stevens 11/01/2023, 12:00 PM

## 2023-11-01 NOTE — TOC Progression Note (Signed)
 Transition of Care [facility]) - Progression Note    Patient Details  Name: Terry Stevens MRN: [id number] Date of Birth: 09-28-1945  [facility]) CM/SW Contact  Hoy DELENA Bigness, LCSW Phone Number: 11/01/2023, 10:23 AM  Clinical Narrative:    CSW reviewed bed offers for SNF with pt's spouse. Ms Adduci has accepted offer for STR at [facility]. CV to initiate insurance auth. CSW will continue to follow for approval.   [facility] [address] [place], [place] [address] [phone] Overall rating ? [facility] [address] [place], [place] [address] [phone] Overall rating ??  Expected Discharge Plan: Skilled Nursing Facility Barriers to Discharge: Continued Medical Work up, SNF Pending bed offer               Expected Discharge Plan and Services In-house Referral: Clinical Social Work Discharge Planning Services: NA Post Acute Care Choice: Skilled Nursing Facility, Nursing Home Living arrangements for the past 2 months: Single Family Home                 DME Arranged: N/A DME Agency: NA                   Social Drivers of Health (SDOH) Interventions SDOH Screenings   Food Insecurity: No Food Insecurity (10/30/2023)  Housing: Low Risk  (10/30/2023)  Transportation Needs: No Transportation Needs (10/30/2023)  Utilities: Not At Risk (10/30/2023)  Alcohol Screen: Low Risk  (03/13/2023)  Depression (PHQ2-9): Low Risk  (03/13/2023)  Financial Resource Strain: Low Risk  (03/13/2023)  Physical Activity: Inactive (03/13/2023)  Social Connections: Socially Integrated (10/30/2023)  Stress: No Stress Concern Present (03/13/2023)  Tobacco Use: Medium Risk (10/30/2023)  Health Literacy: Adequate Health Literacy (03/13/2023)    Readmission Risk Interventions     No data to display

## 2023-11-01 NOTE — Plan of Care (Signed)
  Problem: Health Behavior/Discharge Planning: Goal: Ability to manage health-related needs will improve Outcome: Progressing   Problem: Clinical Measurements: Goal: Will remain free from infection Outcome: Progressing Goal: Diagnostic test results will improve Outcome: Progressing Goal: Respiratory complications will improve Outcome: Progressing Goal: Cardiovascular complication will be avoided Outcome: Progressing   Problem: Activity: Goal: Risk for activity intolerance will decrease Outcome: Progressing   Problem: Coping: Goal: Level of anxiety will decrease Outcome: Progressing   Problem: Elimination: Goal: Will not experience complications related to bowel motility Outcome: Progressing Goal: Will not experience complications related to urinary retention Outcome: Progressing   Problem: Pain Managment: Goal: General experience of comfort will improve and/or be controlled Outcome: Progressing   Problem: Safety: Goal: Ability to remain free from injury will improve Outcome: Progressing   Problem: Skin Integrity: Goal: Risk for impaired skin integrity will decrease Outcome: Progressing   Problem: Education: Goal: Ability to describe self-care measures that may prevent or decrease complications (Diabetes Survival Skills Education) will improve Outcome: Progressing Goal: Individualized Educational Video(s) Outcome: Progressing   Problem: Coping: Goal: Ability to adjust to condition or change in health will improve Outcome: Progressing   Problem: Fluid Volume: Goal: Ability to maintain a balanced intake and output will improve Outcome: Progressing   Problem: Health Behavior/Discharge Planning: Goal: Ability to identify and utilize available resources and services will improve Outcome: Progressing Goal: Ability to manage health-related needs will improve Outcome: Progressing   Problem: Metabolic: Goal: Ability to maintain appropriate glucose levels will  improve Outcome: Progressing   Problem: Nutritional: Goal: Progress toward achieving an optimal weight will improve Outcome: Progressing   Problem: Skin Integrity: Goal: Risk for impaired skin integrity will decrease Outcome: Progressing   Problem: Tissue Perfusion: Goal: Adequacy of tissue perfusion will improve Outcome: Progressing   Problem: Activity: Goal: Ability to tolerate increased activity will improve Outcome: Progressing   Problem: Clinical Measurements: Goal: Ability to maintain a body temperature in the normal range will improve Outcome: Progressing   Problem: Respiratory: Goal: Ability to maintain adequate ventilation will improve Outcome: Progressing Goal: Ability to maintain a clear airway will improve Outcome: Progressing

## 2023-11-01 NOTE — Progress Notes (Signed)
 PROGRESS NOTE    Terry Stevens  FMW:[id number] DOB: 04-14-1945 DOA: 10/30/2023 PCP: Orpha Yancey LABOR, MD   Brief Narrative:  78 y.o. male with medical history significant of dementia, chronic diastolic heart failure, DM, and HTN and recent admission from 09/15/2023 -09/18/2023 for pneumonia and CHF exacerbation treated with antibiotics and diuretics presented with altered mental status and cough.  On presentation, CT of the chest showed bibasilar/multifocal pneumonia.  He was started on Rocephin  and Zithromax .  PT recommended SNF placement.  TOC consulted.  Assessment & Plan:   Multifocal pneumonia Acute respiratory failure with hypoxia - CT of the chest as above.  Currently on room air.  RSV/flu/COVID PCR negative.  Blood cultures negative so far.  Procalcitonin elevated at 20 -Continue Rocephin  and Zithromax . - Diet as per SLP recommendations -Currently on 2 L oxygen  via nasal cannula.  Wean off as able. - Continue as needed albuterol  and Mucinex   Acute metabolic encephalopathy History of dementia Dysphagia -Has advanced dementia at baseline.  Patient is having more behavioral issues currently due to present illness.  Wife thinks he will need placement. -Monitor mental status.  Fall precautions - PT recommended SNF placement.  TOC consulted. - Continue memantine , donepezil  and trazodone  - Dysphagia 3 diet as per SLP recommendations  Chronic diastolic heart failure - Echo on 09/16/2023 had shown EF of 65 to 70%.  Currently compensated.  Strict input and output.  Daily weights.  Fluid restriction.  Diuretics on hold.  Hypertension Hyperlipidemia -Continue amlodipine , Coreg , terazosin .  Lasix  and spironolactone  on hold.  Monitor to resume diuretics on discharge. - Continue statin.  Fish oil  on hold for now.  Diabetes mellitus type 2 with hypoglycemia -continue CBGs with SSI.  Decrease dose of long-acting insulin  to 10 units twice daily.  Carb modified  diet  Leukocytosis -Resolved  Anemia of chronic disease -From chronic illnesses.  Hemoglobin stable.  Monitor intermittently  Chronic kidney disease stage IV - Baseline creatinine of 2.3-2.6.  Creatinine currently at at baseline.  Monitor.  Physical deconditioning - PT recommendations as above  Obesity class II - Outpatient follow-up  DVT prophylaxis: Lovenox  Code Status: DNR Family Communication: Daughter at bedside Disposition Plan: Status is: Observation The patient will require care spanning > 2 midnights and should be moved to inpatient because: Of severity of illness    Consultants: None  Procedures: None  Antimicrobials: Rocephin  and Zithromax  from 10/30/2023 onwards   Subjective: Patient seen and examined at bedside.  Oral intake remains poor.  No agitation, seizures, fever or vomiting reported. Objective: Vitals:   10/31/23 0603 10/31/23 1338 10/31/23 2057 11/01/23 0427  BP: 130/71 136/73 137/67 (!) 164/72  Pulse: 62 63 62 68  Resp: 18  17 16   Temp: 98.2 F (36.8 C) 98.3 F (36.8 C) 98.1 F (36.7 C) 98.2 F (36.8 C)  TempSrc:  Oral Oral Oral  SpO2: 90% 90% 92% 98%  Weight:    106 kg  Height:        Intake/Output Summary (Last 24 hours) at 11/01/2023 0736 Last data filed at 11/01/2023 9362 Gross per 24 hour  Intake 470.34 ml  Output 700 ml  Net -229.66 ml   Filed Weights   10/30/23 1149 11/01/23 0427  Weight: 106.5 kg 106 kg    Examination:  General: On 2 L oxygen  by nasal cannula.  No distress.  Looks chronically ill and deconditioned. ENT/neck: No thyromegaly.  JVD is not elevated  respiratory: Decreased breath sounds at bases bilaterally with some crackles; no  wheezing  CVS: S1-S2 heard, rate controlled currently Abdominal: Soft, obese, nontender, slightly distended; no organomegaly, bowel sounds are heard Extremities: Trace lower extremity edema; no cyanosis  CNS: Wakes up slightly but remains confused.  No focal neurologic deficit.   Moves extremities Lymph: No obvious lymphadenopathy Skin: No obvious ecchymosis/lesions  psych: Affect is mostly flat.  Currently not agitated.   Musculoskeletal: No obvious joint swelling/deformity   Data Reviewed: I have personally reviewed following labs and imaging studies  CBC: Recent Labs  Lab 10/30/23 1203 10/31/23 0426 11/01/23 0422  WBC 12.2* 8.3 7.9  NEUTROABS 9.5*  --  5.4  HGB 11.0* 9.7* 9.5*  HCT 34.0* 31.4* 31.3*  MCV 87.6 88.0 89.7  PLT 202 164 189   Basic Metabolic Panel: Recent Labs  Lab 10/30/23 1203 10/31/23 0426 11/01/23 0422  NA 140 140 140  K 4.3 4.2 4.7  CL 106 106 104  CO2 24 24 24   GLUCOSE 107* 119* 98  BUN 34* 33* 28*  CREATININE 2.85* 2.54* 2.36*  CALCIUM  8.6* 8.5* 8.7*  MG  --   --  1.7   GFR: Estimated Creatinine Clearance: 30.9 mL/min (A) (by C-G formula based on SCr of 2.36 mg/dL (H)). Liver Function Tests: No results for input(s): AST, ALT, ALKPHOS, BILITOT, PROT, ALBUMIN in the last 168 hours. No results for input(s): LIPASE, AMYLASE in the last 168 hours. No results for input(s): AMMONIA in the last 168 hours. Coagulation Profile: No results for input(s): INR, PROTIME in the last 168 hours. Cardiac Enzymes: No results for input(s): CKTOTAL, CKMB, CKMBINDEX, TROPONINI in the last 168 hours. BNP (last 3 results) No results for input(s): PROBNP in the last 8760 hours. HbA1C: No results for input(s): HGBA1C in the last 72 hours. CBG: Recent Labs  Lab 10/31/23 1618 10/31/23 2143 11/01/23 0001 11/01/23 0100 11/01/23 0433  GLUCAP 123* 55* 62* 132* 106*   Lipid Profile: No results for input(s): CHOL, HDL, LDLCALC, TRIG, CHOLHDL, LDLDIRECT in the last 72 hours. Thyroid Function Tests: No results for input(s): TSH, T4TOTAL, FREET4, T3FREE, THYROIDAB in the last 72 hours. Anemia Panel: No results for input(s): VITAMINB12, FOLATE, FERRITIN, TIBC, IRON,  RETICCTPCT in the last 72 hours. Sepsis Labs: Recent Labs  Lab 10/30/23 1203 10/30/23 1736  PROCALCITON 20.00  --   LATICACIDVEN  --  0.7    Recent Results (from the past 240 hours)  Resp panel by RT-PCR (RSV, Flu A&B, Covid) Anterior Nasal Swab     Status: None   Collection Time: 10/30/23  2:52 PM   Specimen: Anterior Nasal Swab  Result Value Ref Range Status   SARS Coronavirus 2 by RT PCR NEGATIVE NEGATIVE Final    Comment: (NOTE) SARS-CoV-2 target nucleic acids are NOT DETECTED.  The SARS-CoV-2 RNA is generally detectable in upper respiratory specimens during the acute phase of infection. The lowest concentration of SARS-CoV-2 viral copies this assay can detect is 138 copies/mL. A negative result does not preclude SARS-Cov-2 infection and should not be used as the sole basis for treatment or other patient management decisions. A negative result may occur with  improper specimen collection/handling, submission of specimen other than nasopharyngeal swab, presence of viral mutation(s) within the areas targeted by this assay, and inadequate number of viral copies(<138 copies/mL). A negative result must be combined with clinical observations, patient history, and epidemiological information. The expected result is Negative.  Fact Sheet for Patients:  BloggerCourse.com  Fact Sheet for Healthcare Providers:  SeriousBroker.it  This test is no t  yet approved or cleared by the [place]  FDA and  has been authorized for detection and/or diagnosis of SARS-CoV-2 by FDA under an Emergency Use Authorization (EUA). This EUA will remain  in effect (meaning this test can be used) for the duration of the COVID-19 declaration under Section 564(b)(1) of the Act, 21 U.S.C.section 360bbb-3(b)(1), unless the authorization is terminated  or revoked sooner.       Influenza A by PCR NEGATIVE NEGATIVE Final   Influenza B by PCR NEGATIVE  NEGATIVE Final    Comment: (NOTE) The Xpert Xpress SARS-CoV-2/FLU/RSV plus assay is intended as an aid in the diagnosis of influenza from Nasopharyngeal swab specimens and should not be used as a sole basis for treatment. Nasal washings and aspirates are unacceptable for Xpert Xpress SARS-CoV-2/FLU/RSV testing.  Fact Sheet for Patients: BloggerCourse.com  Fact Sheet for Healthcare Providers: SeriousBroker.it  This test is not yet approved or cleared by the [place]  FDA and has been authorized for detection and/or diagnosis of SARS-CoV-2 by FDA under an Emergency Use Authorization (EUA). This EUA will remain in effect (meaning this test can be used) for the duration of the COVID-19 declaration under Section 564(b)(1) of the Act, 21 U.S.C. section 360bbb-3(b)(1), unless the authorization is terminated or revoked.     Resp Syncytial Virus by PCR NEGATIVE NEGATIVE Final    Comment: (NOTE) Fact Sheet for Patients: BloggerCourse.com  Fact Sheet for Healthcare Providers: SeriousBroker.it  This test is not yet approved or cleared by the [place]  FDA and has been authorized for detection and/or diagnosis of SARS-CoV-2 by FDA under an Emergency Use Authorization (EUA). This EUA will remain in effect (meaning this test can be used) for the duration of the COVID-19 declaration under Section 564(b)(1) of the Act, 21 U.S.C. section 360bbb-3(b)(1), unless the authorization is terminated or revoked.  Performed at [facility], [address], [place], [place] [address]   Blood culture (routine x 2)     Status: None (Preliminary result)   Collection Time: 10/30/23  5:36 PM   Specimen: Right Antecubital; Blood  Result Value Ref Range Status   Specimen Description RIGHT ANTECUBITAL  Final   Special Requests   Final    BOTTLES DRAWN AEROBIC AND ANAEROBIC Blood Culture adequate volume    Culture   Final    NO GROWTH 2 DAYS Performed at [facility], [address] [place], [place] [address]    Report Status PENDING  Incomplete  Blood culture (routine x 2)     Status: None (Preliminary result)   Collection Time: 10/30/23  5:36 PM   Specimen: Left Antecubital; Blood  Result Value Ref Range Status   Specimen Description LEFT ANTECUBITAL  Final   Special Requests   Final    BOTTLES DRAWN AEROBIC AND ANAEROBIC Blood Culture adequate volume   Culture   Final    NO GROWTH 2 DAYS Performed at [facility], [address], [place], [place] [address]    Report Status PENDING  Incomplete         Radiology Studies: CT Chest Wo Contrast Result Date: 10/30/2023 CLINICAL DATA:  Respiratory illness EXAM: CT CHEST WITHOUT CONTRAST TECHNIQUE: Multidetector CT imaging of the chest was performed following the standard protocol without IV contrast. RADIATION DOSE REDUCTION: This exam was performed according to the departmental dose-optimization program which includes automated exposure control, adjustment of the mA and/or kV according to patient size and/or use of iterative reconstruction technique. COMPARISON:  CT chest abdomen and pelvis 08/14/2023. FINDINGS:  Cardiovascular: Heart is mildly enlarged. There is no pericardial effusion. The aorta is normal in size. Mediastinum/Nodes: There is a small hiatal hernia. Esophagus is within normal limits. There is a hypodense right thyroid nodule measuring 10 mm. Enlarged subcarinal lymph node measures 14 mm, unchanged. No other enlarged lymph nodes are seen. There are calcified right hilar and subcarinal lymph nodes similar to prior. Lungs/Pleura: Patchy ground-glass and airspace opacities are seen throughout the bilateral lower lobes, right greater than left. There also mild ground-glass opacities in the bilateral upper lobes and right middle lobe. There is no pleural effusion or pneumothorax. Small slightly nodular 6 mm density seen along the  posterior aspect of the distal trachea just above the bifurcation. Upper Abdomen: There is a 2.5 cm cyst in the left kidney. No acute abnormalities. Musculoskeletal: Degenerative changes affect the spine. IMPRESSION: 1. Patchy ground-glass and airspace opacities throughout the bilateral lower lobes, right greater than left. Findings are concerning for multifocal pneumonia. 2. Small slightly nodular density along the posterior aspect of the distal trachea just above the bifurcation. Findings may be related to small focal lesions or secretions. Consider direct visualization. 3. Stable enlarged subcarinal lymph node. 4. Small hiatal hernia. 5. 1 cm incidental right thyroid nodule. No follow-up imaging recommended. Electronically Signed   By: Greig Pique M.D.   On: 10/30/2023 16:41   DG Chest Portable 1 View Result Date: 10/30/2023 CLINICAL DATA:  Cough. EXAM: PORTABLE CHEST 1 VIEW COMPARISON:  09/15/2023. FINDINGS: Cardiopericardial silhouette is at upper limits of normal for size. Linear densities in both lung bases suggest atelectasis or scarring. No overt edema or focal consolidative opacity. No substantial pleural effusion. No acute bony abnormality. IMPRESSION: Bibasilar atelectasis or scarring. Electronically Signed   By: Camellia Candle M.D.   On: 10/30/2023 12:51        Scheduled Meds:  allopurinol   100 mg Oral Daily   amLODipine   5 mg Oral Daily   aspirin  EC  81 mg Oral Daily   atorvastatin   20 mg Oral QHS   carvedilol   25 mg Oral BID   docusate sodium   100 mg Oral BID   donepezil   10 mg Oral QPM   enoxaparin  (LOVENOX ) injection  30 mg Subcutaneous Q24H   insulin  aspart  0-9 Units Subcutaneous TID WC   insulin  glargine  30 Units Subcutaneous BID   memantine   10 mg Oral BID   metoCLOPramide   5 mg Oral TID   pantoprazole   40 mg Oral BID   sodium bicarbonate   650 mg Oral BID   terazosin   5 mg Oral QHS   traZODone   100 mg Oral QHS   Continuous Infusions:  azithromycin  500 mg (10/31/23  1553)   cefTRIAXone  (ROCEPHIN )  IV 2 g (10/31/23 1656)          Sophie Mao, MD [facility] 11/01/2023, 7:36 AM

## 2023-11-02 ENCOUNTER — Observation Stay (HOSPITAL_COMMUNITY)

## 2023-11-02 DIAGNOSIS — J1569 Pneumonia due to other gram-negative bacteria: Secondary | ICD-10-CM | POA: Diagnosis not present

## 2023-11-02 DIAGNOSIS — Z880 Allergy status to penicillin: Secondary | ICD-10-CM | POA: Diagnosis not present

## 2023-11-02 DIAGNOSIS — K219 Gastro-esophageal reflux disease without esophagitis: Secondary | ICD-10-CM | POA: Diagnosis not present

## 2023-11-02 DIAGNOSIS — N184 Chronic kidney disease, stage 4 (severe): Secondary | ICD-10-CM | POA: Diagnosis not present

## 2023-11-02 DIAGNOSIS — K2100 Gastro-esophageal reflux disease with esophagitis, without bleeding: Secondary | ICD-10-CM

## 2023-11-02 DIAGNOSIS — E119 Type 2 diabetes mellitus without complications: Secondary | ICD-10-CM

## 2023-11-02 DIAGNOSIS — Z87891 Personal history of nicotine dependence: Secondary | ICD-10-CM | POA: Diagnosis not present

## 2023-11-02 DIAGNOSIS — J189 Pneumonia, unspecified organism: Secondary | ICD-10-CM | POA: Diagnosis not present

## 2023-11-02 DIAGNOSIS — G309 Alzheimer's disease, unspecified: Secondary | ICD-10-CM | POA: Diagnosis not present

## 2023-11-02 DIAGNOSIS — R1310 Dysphagia, unspecified: Secondary | ICD-10-CM | POA: Diagnosis not present

## 2023-11-02 DIAGNOSIS — Z79899 Other long term (current) drug therapy: Secondary | ICD-10-CM | POA: Diagnosis not present

## 2023-11-02 DIAGNOSIS — E66812 Obesity, class 2: Secondary | ICD-10-CM | POA: Diagnosis not present

## 2023-11-02 DIAGNOSIS — Z7401 Bed confinement status: Secondary | ICD-10-CM | POA: Diagnosis not present

## 2023-11-02 DIAGNOSIS — Z789 Other specified health status: Secondary | ICD-10-CM | POA: Diagnosis not present

## 2023-11-02 DIAGNOSIS — Z96652 Presence of left artificial knee joint: Secondary | ICD-10-CM | POA: Diagnosis not present

## 2023-11-02 DIAGNOSIS — E785 Hyperlipidemia, unspecified: Secondary | ICD-10-CM | POA: Diagnosis not present

## 2023-11-02 DIAGNOSIS — Z6836 Body mass index (BMI) 36.0-36.9, adult: Secondary | ICD-10-CM | POA: Diagnosis not present

## 2023-11-02 DIAGNOSIS — Z66 Do not resuscitate: Secondary | ICD-10-CM

## 2023-11-02 DIAGNOSIS — G9341 Metabolic encephalopathy: Secondary | ICD-10-CM | POA: Diagnosis not present

## 2023-11-02 DIAGNOSIS — E1122 Type 2 diabetes mellitus with diabetic chronic kidney disease: Secondary | ICD-10-CM | POA: Diagnosis not present

## 2023-11-02 DIAGNOSIS — I130 Hypertensive heart and chronic kidney disease with heart failure and stage 1 through stage 4 chronic kidney disease, or unspecified chronic kidney disease: Secondary | ICD-10-CM | POA: Diagnosis not present

## 2023-11-02 DIAGNOSIS — R410 Disorientation, unspecified: Secondary | ICD-10-CM | POA: Diagnosis not present

## 2023-11-02 DIAGNOSIS — D631 Anemia in chronic kidney disease: Secondary | ICD-10-CM | POA: Diagnosis not present

## 2023-11-02 DIAGNOSIS — Z7189 Other specified counseling: Secondary | ICD-10-CM

## 2023-11-02 DIAGNOSIS — R509 Fever, unspecified: Secondary | ICD-10-CM | POA: Diagnosis not present

## 2023-11-02 DIAGNOSIS — Z7982 Long term (current) use of aspirin: Secondary | ICD-10-CM | POA: Diagnosis not present

## 2023-11-02 DIAGNOSIS — I10 Essential (primary) hypertension: Secondary | ICD-10-CM | POA: Diagnosis not present

## 2023-11-02 DIAGNOSIS — J9601 Acute respiratory failure with hypoxia: Secondary | ICD-10-CM | POA: Diagnosis not present

## 2023-11-02 DIAGNOSIS — Z515 Encounter for palliative care: Secondary | ICD-10-CM | POA: Diagnosis not present

## 2023-11-02 DIAGNOSIS — M109 Gout, unspecified: Secondary | ICD-10-CM | POA: Diagnosis not present

## 2023-11-02 LAB — GLUCOSE, CAPILLARY
Glucose-Capillary: 121 mg/dL — ABNORMAL HIGH (ref 70–99)
Glucose-Capillary: 121 mg/dL — ABNORMAL HIGH (ref 70–99)
Glucose-Capillary: 114 mg/dL — ABNORMAL HIGH (ref 70–99)
Glucose-Capillary: 145 mg/dL — ABNORMAL HIGH (ref 70–99)
Glucose-Capillary: 95 mg/dL (ref 70–99)

## 2023-11-02 MED ORDER — SENNOSIDES-DOCUSATE SODIUM 8.6-50 MG PO TABS
2.0000 | ORAL_TABLET | Freq: Two times a day (BID) | ORAL | Status: DC
  Administered 2023-11-02 – 2023-11-03 (×2): 2 via ORAL
  Filled 2023-11-02 (×2): qty 2

## 2023-11-02 NOTE — Consult Note (Signed)
 Consultation Note Date: 11/02/2023   Patient Name: Terry Stevens  DOB: 07/07/1945  MRN: [id number]  Age / Sex: 78 y.o., male  PCP: Orpha Yancey LABOR, MD Referring Physician: Vicci Afton CROME, MD  Reason for Consultation: Establishing goals of care  HPI/Patient Profile: 78 y.o. male  with past medical history of dementia, chronic diastolic heart failure, DM, and HTN admitted on 10/30/2023 with altered mental status and cough.   Diagnosed with acute respiratory failure with hypoxia secondary to multifocal pneumonia.  Initiated on IV antibiotics.  Also noted to have some acute metabolic encephalopathy with worsening behavior issues likely due to delirium from acute illness.  PMT has been consulted to assist with goals of care conversation.  Patient has had recurrent hospitalizations (2 during the past 6 months). He was most recently admitted from 09/15/2023-09/18/2023 for pneumonia. Admitted 08/14/2023-08/17/2023 for dysphagia and intractable nausea and vomiting.   Today, Labs independently reviewed.  Renal function elevated but progressively improving since admission with current creatinine 2.36 (2.54<<2.5<<2.90).  Continue IV fluids and monitoring.  CBC reviewed.  White blood cell count stable/improved at 7.9 today (8.3<<12.2).  Continue full antibiotic course which appears effective.  Continue to monitor.  CT chest independently reviewed evidence of multiple airspace opacities visible throughout bilateral lower lobes of lungs concerning for infection.  Cardiomegaly noted.  CT head independently reviewed.  Atrophy noted consistent with age and his dementia diagnosis.  Otherwise no visible acute findings.  Vital signs reviewed.  Largely appears stable with some intermittent hypertension.  He is requiring O2 via nasal cannula at 2 L to maintain O2 sats.  Otherwise vital stable.  Medication administration record reviewed.  No as needed symptom meds administered on 24-hour  look back.  Independent history obtained due to patient's baseline cognitive status. No nursing concerns. They deny any behavioral concerns at present.  Note that he is largely cooperative.  No significant nursing concerns at this time.  Independent history also obtained from wife.  See below.  Clinical Assessment and Goals of Care:  I have reviewed medical records including EPIC notes, labs and imaging (independently reviewed), vital signs, MAR, assessed the patient and then spoke with wife via phone to discuss diagnosis prognosis, GOC, EOL wishes, disposition and options. Collaborated directly with attending physician, TOC, and bedside nursing staff.   I introduced Palliative Medicine as specialized medical care for people living with serious illness. It focuses on providing relief from the symptoms and stress of a serious illness. The goal is to improve quality of life for both the patient and the family.  We discussed a brief life review of the patient and then focused on their current illness.   I attempted to elicit values and goals of care important to the patient.    Medical History Review and Family/Patient Understanding:   Patient's wife has a good understanding of our current health concerns and understands that the patient's dementia is progressing. I discussed with her our concerns regarding the acute illness he is currently experiencing and the evidence of progression of his chronic condition in the context of his existing comorbidities, and explained how this may impact his prognosis.  Also extensively discussed and provided education about dementia disease trajectory and where patient currently is on that trajectory.  Discussed given recurrent hospitalizations, current functional status, and trajectory of decline that his time could be limited and potential prognosis is of less than 1 year.  She verbalizes understanding of this.  Social History:  Prior to this hospitalization,  patient lives at home with his wife however, she has been having great difficulty caring for him at home.  She reports they have been married since she was 29.  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  She shares he could completely take a sharp car and put it back together.  She also talks of their love for riding motorcycles etc and how the person he is now is completed from from who he used to be.  Functional and Nutritional State:  Patient reports that over the past several months he has had significant declines in his cognitive and physical status.  He has been bedbound Since being in the hospital level for this was ambulatory however, having more trouble getting around.  Incontinent of bowel and bladder.  She was having to assist with bathing, dressing, and feeding.  He was having issues with wandering outside the home.  Also having some behaviors.  She had to cover all the mirrors in her house because if he saw himself in the mirror he would become angry thinking there was another man in the home.  No recent elopements but he did have 1 quite sometime ago.  Wife feels that she can no longer provide the level of care that he needs safely in the home.  He is also dependent for all IADLs.  Appetite has decreased lately.  Palliative Symptoms:  None currently  Advance Directives/Goals of Care/Anticipatory care planning Discussion:  A detailed discussion regarding GOC, advanced directives, and anticipatory care planning was had. Engaged wife in a detailed conversation about the seriousness of his current illness, in light of his advanced age and complex medical history, and explained the potential implications this may have for his recovery and long-term well-being.  She verbalizes understanding of this. Goals of care elicited. Wife states she feels at this point he would want to focus on having good quality  of life for the time he has left. She knows she can no longer care for him and so she wants him to be somewhere he can get 24/7 care. Discussed this level of care would be at [facility]. She is in agreement with SNF and is aware that he has been seen by therapy here and SNF recommended.  We discussed that for long-term care there may be different financial obligations.  She agrees with SNF for therapy and will work on necessary paperwork to obtain coverage for long-term care once therapy is no longer deemed beneficial.  Engaged in discussion of advance directives including the limitations and potential burdens of CPR and intubation, particularly in the context of advanced age and serious underlying health conditions. She confirms DNR/DNI status.  The difference between aggressive medical intervention and comfort care was considered in light of the patient's goals of care.  She does wish to continue to treat the treatable and discharged to SNF for rehab to optimize his status. Hospice and Palliative Care services outpatient were explained and offered.  She is agreeable to outpatient palliative services.    Discussed the importance of continued conversation with family and the medical providers regarding overall plan of care and treatment options, ensuring decisions are within the context of the patient's values and GOCs.   Questions and concerns were addressed. The family was encouraged to call with questions or concerns.  PMT will continue to support holistically.  Primary Decision maker and  health care surrogate:  NEXT OF KIN (wife)  Code Status:  DNR/DNI    SUMMARY OF RECOMMENDATIONS    CODE STATUS: DNR/DNI Treat the treatable Discharge to SNF for skilled therapy with ultimate plan to pursue long-term placement Outpatient palliative referral Palliative medicine team will continue to follow as needed for ongoing goals of care discussion, symptom management, and coordination of care.  Code  Status/Advance Care Planning: DNR   Symptom Management:  Symptoms stable at present, therefore continue symptom regimen per admitting team with PMT available as needed for support   Prognosis:  Unable to determine likely less than 1 year  Discharge Planning: Skilled Nursing Facility for rehab with Palliative care service follow-up      Primary Diagnoses: Present on Admission:  Multifocal pneumonia  Metabolic encephalopathy  Hypertension  GERD (gastroesophageal reflux disease)  Class 2 obesity  CKD (chronic kidney disease) stage 4, GFR 15-29 ml/min (HCC)    Physical Exam Constitutional:      General: He is not in acute distress.    Appearance: He is not toxic-appearing.  Pulmonary:     Effort: Pulmonary effort is normal. No respiratory distress.  Skin:    General: Skin is warm and dry.  Neurological:     Mental Status: He is alert.     Comments: Pleasantly confused and cooperative     Vital Signs: BP (!) 140/68 (BP Location: Right Wrist)   Pulse 69   Temp 98.9 F (37.2 C) (Axillary)   Resp 18   Ht 5' 7 (1.702 m)   Wt 106.4 kg   SpO2 99%   BMI 36.74 kg/m  Pain Scale: 0-10   Pain Score: 0-No pain   SpO2: SpO2: 99 % O2 Device:SpO2: 99 % O2 Flow Rate: .O2 Flow Rate (L/min): 2 L/min   Palliative Assessment/Data: Currently: 30 to 40%    Billing based on MDM: High  Problems Addressed: One acute or chronic illness or injury that poses a threat to life or bodily function  Amount and/or Complexity of Data: Category 1:Assessment requiring an independent historian(s), Category 2:Independent interpretation of a test performed by another physician/other qualified health care professional (not separately reported), and Category 3:Discussion of management or test interpretation with external physician/other qualified health care professional/appropriate source (not separately reported)  Risks: n/a   Laymon CHRISTELLA Pinal, NP  Palliative Medicine Team Team phone  # [phone]  Thank you for allowing the Palliative Medicine Team to assist in the care of this patient. Please utilize secure chat with additional questions, if there is no response within 30 minutes please call the above phone number.  Palliative Medicine Team providers are available by phone from 7am to 7pm daily and can be reached through the team cell phone.  Should this patient require assistance outside of these hours, please call the patient's attending physician.

## 2023-11-02 NOTE — TOC Progression Note (Addendum)
 Transition of Care [facility]) - Progression Note    Patient Details  Name: Terry Stevens MRN: [id number] Date of Birth: 1945/02/16  [facility] CM/SW Contact  Hoy DELENA Bigness, LCSW Phone Number: 11/02/2023, 12:05 PM  Clinical Narrative:    Pt's insurance authorization is under an OON review for facility choice.   ADDENDUM: CSW received call from [facility] to inform that pt will need to select a different STR facility as CV is OON with his insurance. CSW spoke with pt's wife to inform of this and to review alternate placement options. Pt's spouse has accepted bed offer at [facility]. CSW left VM w/ Kasie [phone] ext [phone]) at [facility] to update insurance authorization.   Expected Discharge Plan: Skilled Nursing Facility Barriers to Discharge: Continued Medical Work up, SNF Pending bed offer               Expected Discharge Plan and Services In-house Referral: Clinical Social Work Discharge Planning Services: NA Post Acute Care Choice: Skilled Nursing Facility, Nursing Home Living arrangements for the past 2 months: Single Family Home                 DME Arranged: N/A DME Agency: NA                   Social Drivers of Health (SDOH) Interventions SDOH Screenings   Food Insecurity: No Food Insecurity (10/30/2023)  Housing: Low Risk  (10/30/2023)  Transportation Needs: No Transportation Needs (10/30/2023)  Utilities: Not At Risk (10/30/2023)  Alcohol Screen: Low Risk  (03/13/2023)  Depression (PHQ2-9): Low Risk  (03/13/2023)  Financial Resource Strain: Low Risk  (03/13/2023)  Physical Activity: Inactive (03/13/2023)  Social Connections: Socially Integrated (10/30/2023)  Stress: No Stress Concern Present (03/13/2023)  Tobacco Use: Medium Risk (10/30/2023)  Health Literacy: Adequate Health Literacy (03/13/2023)    Readmission Risk Interventions     No data to display

## 2023-11-02 NOTE — Progress Notes (Signed)
 PROGRESS NOTE   Terry Stevens  FMW:[id number] DOB: [birth year] DOA: 10/30/2023 PCP: Orpha Yancey LABOR, MD   Chief Complaint  Patient presents with   Cough   Level of care: Med-Surg  Brief Admission History:  78 y.o. male with medical history significant of dementia, chronic diastolic heart failure, DM, and HTN and recent admission from 09/15/2023 -09/18/2023 for pneumonia and CHF exacerbation treated with antibiotics and diuretics presented with altered mental status and cough.  On presentation, CT of the chest showed bibasilar/multifocal pneumonia.  He was started on Rocephin  and Zithromax .  PT recommended SNF placement.  TOC consulted.    Assessment and Plan:  Multifocal pneumonia Acute respiratory failure with hypoxia - CT of the chest as above.  RSV/flu/COVID PCR negative.  Blood cultures negative so far.  Procalcitonin elevated at 20 - Continue Rocephin  and Zithromax  to complete full course  - Diet as per SLP recommendations - Currently on 2 L oxygen  via nasal cannula.  Wean off as able. - Continue as needed albuterol  and Mucinex    Acute metabolic encephalopathy History of dementia Dysphagia -Has advanced dementia at baseline.  Patient is having more behavioral issues currently due to present illness.  Wife thinks he will need placement. -Monitor mental status.  Fall precautions - PT recommended SNF placement.  TOC consulted. - Continue memantine , donepezil  and trazodone  - Dysphagia 3 diet as per SLP recommendations   Chronic diastolic heart failure - Echo on 09/16/2023 had shown EF of 65 to 70%.  Currently compensated.  Strict input and output.  Daily weights.  Fluid restriction.  Diuretics on hold.   Hypertension Hyperlipidemia -Continue amlodipine , Coreg , terazosin .  Lasix  and spironolactone  on hold.  Monitor to resume diuretics on discharge. - Continue statin.  Fish oil  on hold for now.   Diabetes mellitus type 2 with hypoglycemia -continue CBGs with SSI.  Decrease dose of  long-acting insulin  to 10 units twice daily.  Carb modified diet  CBG (last 3)  Recent Labs    11/02/23 0656 11/02/23 0727 11/02/23 1125  GLUCAP 121* 121* 114*    Leukocytosis -Resolved   Anemia of chronic disease -From chronic illnesses.  Hemoglobin stable.  Monitor intermittently   Chronic kidney disease stage IV - Baseline creatinine of 2.3-2.6.  Creatinine currently at at baseline.  Monitor.   Physical deconditioning - PT recommendations as above, SNF recommended, TOC involved   Obesity class II - Outpatient follow-up  DVT prophylaxis: enoxaparin  Code Status: DNR  DNI  Family Communication:  Disposition:   Consultants:   Procedures:   Antimicrobials:    Subjective: Pt reports cough and chest congestion, no CP, no SOB.    Objective: Vitals:   11/01/23 1209 11/01/23 2013 11/02/23 0320 11/02/23 0650  BP: (!) 158/94 (!) 150/86 (!) 141/69 (!) 179/81  Pulse: 68 90 74 71  Resp:  16 16 16   Temp: 98.3 F (36.8 C) 99.6 F (37.6 C) 98.8 F (37.1 C) 99.1 F (37.3 C)  TempSrc: Oral Axillary Axillary Axillary  SpO2: 97% 100% 100% 98%  Weight:   106.4 kg   Height:   5' 7 (1.702 m)     Intake/Output Summary (Last 24 hours) at 11/02/2023 1142 Last data filed at 11/02/2023 0319 Gross per 24 hour  Intake 170 ml  Output 1250 ml  Net -1080 ml   Filed Weights   10/30/23 1149 11/01/23 0427 11/02/23 0320  Weight: 106.5 kg 106 kg 106.4 kg   Examination:  General exam: Appears calm and comfortable  Respiratory system:  diffuse rales heard. Respiratory effort normal. Cardiovascular system: normal S1 & S2 heard. No JVD, murmurs, rubs, gallops or clicks. No pedal edema. Gastrointestinal system: Abdomen is nondistended, soft and nontender. No organomegaly or masses felt. Normal bowel sounds heard. Central nervous system: Alert and oriented. No focal neurological deficits. Extremities: Symmetric 5 x 5 power. Skin: No rashes, lesions or ulcers. Psychiatry: Judgement and  insight appear normal. Mood & affect appropriate.   Data Reviewed: I have personally reviewed following labs and imaging studies  CBC: Recent Labs  Lab 10/30/23 1203 10/31/23 0426 11/01/23 0422  WBC 12.2* 8.3 7.9  NEUTROABS 9.5*  --  5.4  HGB 11.0* 9.7* 9.5*  HCT 34.0* 31.4* 31.3*  MCV 87.6 88.0 89.7  PLT 202 164 189    Basic Metabolic Panel: Recent Labs  Lab 10/30/23 1203 10/31/23 0426 11/01/23 0422  NA 140 140 140  K 4.3 4.2 4.7  CL 106 106 104  CO2 24 24 24   GLUCOSE 107* 119* 98  BUN 34* 33* 28*  CREATININE 2.85* 2.54* 2.36*  CALCIUM  8.6* 8.5* 8.7*  MG  --   --  1.7    CBG: Recent Labs  Lab 11/01/23 1558 11/01/23 2015 11/02/23 0656 11/02/23 0727 11/02/23 1125  GLUCAP 144* 213* 121* 121* 114*    Recent Results (from the past 240 hours)  Urine Culture     Status: Abnormal (Preliminary result)   Collection Time: 10/30/23  2:10 PM   Specimen: Urine, Random  Result Value Ref Range Status   Specimen Description   Final    URINE, RANDOM Performed at [facility], [address], [place], [place] [address]    Special Requests   Final    NONE Reflexed from [id number] Performed at [facility], [address], [place], [place] [address]    Culture (A)  Final    10,000 COLONIES/mL ENTEROCOCCUS FAECALIS SUSCEPTIBILITIES TO FOLLOW Performed at [facility], [address], [place], [place] [address]    Report Status PENDING  Incomplete  Resp panel by RT-PCR (RSV, Flu A&B, Covid) Anterior Nasal Swab     Status: None   Collection Time: 10/30/23  2:52 PM   Specimen: Anterior Nasal Swab  Result Value Ref Range Status   SARS Coronavirus 2 by RT PCR NEGATIVE NEGATIVE Final    Comment: (NOTE) SARS-CoV-2 target nucleic acids are NOT DETECTED.  The SARS-CoV-2 RNA is generally detectable in upper respiratory specimens during the acute phase of infection. The lowest concentration of SARS-CoV-2 viral copies this assay can detect is 138 copies/mL. A negative  result does not preclude SARS-Cov-2 infection and should not be used as the sole basis for treatment or other patient management decisions. A negative result may occur with  improper specimen collection/handling, submission of specimen other than nasopharyngeal swab, presence of viral mutation(s) within the areas targeted by this assay, and inadequate number of viral copies(<138 copies/mL). A negative result must be combined with clinical observations, patient history, and epidemiological information. The expected result is Negative.  Fact Sheet for Patients:  BloggerCourse.com  Fact Sheet for Healthcare Providers:  SeriousBroker.it  This test is no t yet approved or cleared by the [place]  FDA and  has been authorized for detection and/or diagnosis of SARS-CoV-2 by FDA under an Emergency Use Authorization (EUA). This EUA will remain  in effect (meaning this test can be used) for the duration of the COVID-19 declaration under Section 564(b)(1) of the Act, 21 U.S.C.section 360bbb-3(b)(1), unless the authorization is  terminated  or revoked sooner.       Influenza A by PCR NEGATIVE NEGATIVE Final   Influenza B by PCR NEGATIVE NEGATIVE Final    Comment: (NOTE) The Xpert Xpress SARS-CoV-2/FLU/RSV plus assay is intended as an aid in the diagnosis of influenza from Nasopharyngeal swab specimens and should not be used as a sole basis for treatment. Nasal washings and aspirates are unacceptable for Xpert Xpress SARS-CoV-2/FLU/RSV testing.  Fact Sheet for Patients: BloggerCourse.com  Fact Sheet for Healthcare Providers: SeriousBroker.it  This test is not yet approved or cleared by the [place]  FDA and has been authorized for detection and/or diagnosis of SARS-CoV-2 by FDA under an Emergency Use Authorization (EUA). This EUA will remain in effect (meaning this test can be used)  for the duration of the COVID-19 declaration under Section 564(b)(1) of the Act, 21 U.S.C. section 360bbb-3(b)(1), unless the authorization is terminated or revoked.     Resp Syncytial Virus by PCR NEGATIVE NEGATIVE Final    Comment: (NOTE) Fact Sheet for Patients: BloggerCourse.com  Fact Sheet for Healthcare Providers: SeriousBroker.it  This test is not yet approved or cleared by the [place]  FDA and has been authorized for detection and/or diagnosis of SARS-CoV-2 by FDA under an Emergency Use Authorization (EUA). This EUA will remain in effect (meaning this test can be used) for the duration of the COVID-19 declaration under Section 564(b)(1) of the Act, 21 U.S.C. section 360bbb-3(b)(1), unless the authorization is terminated or revoked.  Performed at [facility], [address], [place], [place] [address]   Blood culture (routine x 2)     Status: None (Preliminary result)   Collection Time: 10/30/23  5:36 PM   Specimen: Right Antecubital; Blood  Result Value Ref Range Status   Specimen Description RIGHT ANTECUBITAL  Final   Special Requests   Final    BOTTLES DRAWN AEROBIC AND ANAEROBIC Blood Culture adequate volume   Culture   Final    NO GROWTH 3 DAYS Performed at [facility], [address] [place], [place] [address]    Report Status PENDING  Incomplete  Blood culture (routine x 2)     Status: None (Preliminary result)   Collection Time: 10/30/23  5:36 PM   Specimen: Left Antecubital; Blood  Result Value Ref Range Status   Specimen Description LEFT ANTECUBITAL  Final   Special Requests   Final    BOTTLES DRAWN AEROBIC AND ANAEROBIC Blood Culture adequate volume   Culture   Final    NO GROWTH 3 DAYS Performed at [facility], [address], [place], [place] [address]    Report Status PENDING  Incomplete     Radiology Studies: CT HEAD WO CONTRAST ( ) Result Date: 11/02/2023 CLINICAL DATA:  Left facial  droop. EXAM: CT HEAD WITHOUT CONTRAST TECHNIQUE: Contiguous axial images were obtained from the base of the skull through the vertex without intravenous contrast. RADIATION DOSE REDUCTION: This exam was performed according to the departmental dose-optimization program which includes automated exposure control, adjustment of the mA and/or kV according to patient size and/or use of iterative reconstruction technique. COMPARISON:  09/17/2023 FINDINGS: Brain: Cavum septum variant is present. Ventricles and cisterns are otherwise unremarkable. Mild prominence of the CSF spaces which is unchanged and likely due to age related atrophy. 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 There is no mass, mass effect, shift of midline structures or acute hemorrhage. No evidence of acute infarction. Vascular: No hyperdense vessel or unexpected calcification. Skull: Normal. Negative for fracture or  focal lesion. Sinuses/Orbits: No acute finding. Other: None. IMPRESSION: 1. No acute findings. 2. 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 Electronically Signed   By: Toribio Agreste M.D.   On: 11/02/2023 08:15    Scheduled Meds:  allopurinol   100 mg Oral Daily   amLODipine   5 mg Oral Daily   aspirin  EC  81 mg Oral Daily   atorvastatin   20 mg Oral QHS   carvedilol   25 mg Oral BID   docusate sodium   100 mg Oral BID   donepezil   10 mg Oral QPM   enoxaparin  (LOVENOX ) injection  30 mg Subcutaneous Q24H   insulin  aspart  0-9 Units Subcutaneous TID WC   insulin  glargine  10 Units Subcutaneous BID   memantine   10 mg Oral BID   metoCLOPramide   5 mg Oral TID   pantoprazole   40 mg Oral BID   sodium bicarbonate   650 mg Oral BID   terazosin   5 mg Oral QHS   traZODone   100 mg Oral QHS   Continuous Infusions:  azithromycin  500 mg (11/01/23 1715)   cefTRIAXone  (ROCEPHIN )  IV 2 g (11/01/23 1757)     LOS: 0 days   Time spent: 53 mins  Yardley Beltran Vicci, MD How to  contact the [facility] Attending or Consulting provider 7A - 7P or covering provider during after hours 7P -7A, for this patient?  Check the care team in [facility] and look for a) attending/consulting [facility] provider listed and b) the [facility] team listed Log into [email or web address] to find provider on call.  Locate the [facility] provider you are looking for under [facility] and page to a number that you can be directly reached. If you still have difficulty reaching the provider, please page the [facility] (Director on Call) for the Hospitalists listed on amion for assistance.  11/02/2023, 11:42 AM

## 2023-11-02 NOTE — Hospital Course (Signed)
 78 y.o. male with medical history significant of dementia, chronic diastolic heart failure, DM, and HTN and recent admission from 09/15/2023 -09/18/2023 for pneumonia and CHF exacerbation treated with antibiotics and diuretics presented with altered mental status and cough.  On presentation, CT of the chest showed bibasilar/multifocal pneumonia.  He was started on Rocephin  and Zithromax .  PT recommended SNF placement.  TOC consulted.

## 2023-11-03 DIAGNOSIS — K2100 Gastro-esophageal reflux disease with esophagitis, without bleeding: Secondary | ICD-10-CM | POA: Diagnosis not present

## 2023-11-03 DIAGNOSIS — I10 Essential (primary) hypertension: Secondary | ICD-10-CM

## 2023-11-03 DIAGNOSIS — J189 Pneumonia, unspecified organism: Secondary | ICD-10-CM | POA: Diagnosis not present

## 2023-11-03 DIAGNOSIS — N184 Chronic kidney disease, stage 4 (severe): Secondary | ICD-10-CM | POA: Diagnosis not present

## 2023-11-03 DIAGNOSIS — E119 Type 2 diabetes mellitus without complications: Secondary | ICD-10-CM | POA: Diagnosis not present

## 2023-11-03 LAB — URINE CULTURE: Culture: 10000 — AB

## 2023-11-03 LAB — GLUCOSE, CAPILLARY
Glucose-Capillary: 115 mg/dL — ABNORMAL HIGH (ref 70–99)
Glucose-Capillary: 120 mg/dL — ABNORMAL HIGH (ref 70–99)
Glucose-Capillary: 121 mg/dL — ABNORMAL HIGH (ref 70–99)

## 2023-11-03 LAB — RENAL FUNCTION PANEL
Albumin: 2.5 g/dL — ABNORMAL LOW (ref 3.5–5.0)
Anion gap: 8 (ref 5–15)
BUN: 27 mg/dL — ABNORMAL HIGH (ref 8–23)
CO2: 27 mmol/L (ref 22–32)
Calcium: 8.7 mg/dL — ABNORMAL LOW (ref 8.9–10.3)
Chloride: 104 mmol/L (ref 98–111)
Creatinine, Ser: 2.11 mg/dL — ABNORMAL HIGH (ref 0.61–1.24)
GFR, Estimated: 32 mL/min — ABNORMAL LOW (ref >60)
Glucose, Bld: 92 mg/dL (ref 70–99)
Phosphorus: 2.8 mg/dL (ref 2.5–4.6)
Potassium: 4.4 mmol/L (ref 3.5–5.1)
Sodium: 139 mmol/L (ref 135–145)

## 2023-11-03 MED ORDER — FUROSEMIDE 20 MG PO TABS: 20.0000 mg | ORAL_TABLET | ORAL | Status: DC

## 2023-11-03 MED ORDER — DOXYCYCLINE HYCLATE 100 MG PO CAPS: 100.0000 mg | ORAL_CAPSULE | Freq: Two times a day (BID) | ORAL | Status: AC

## 2023-11-03 MED ORDER — TRESIBA FLEXTOUCH 100 UNIT/ML ~~LOC~~ SOPN: 10.0000 [IU] | PEN_INJECTOR | Freq: Every day | SUBCUTANEOUS | Status: AC

## 2023-11-03 MED ORDER — SPIRONOLACTONE 25 MG PO TABS: 12.5000 mg | ORAL_TABLET | Freq: Every day | ORAL | Status: DC

## 2023-11-03 MED ORDER — SENNOSIDES-DOCUSATE SODIUM 8.6-50 MG PO TABS: 1.0000 | ORAL_TABLET | Freq: Every day | ORAL | Status: DC

## 2023-11-03 MED ORDER — NYSTATIN 100000 UNIT/ML MT SUSP: 15.0000 mL | Freq: Two times a day (BID) | OROMUCOSAL | Status: AC

## 2023-11-03 MED ORDER — ALBUTEROL SULFATE (2.5 MG/3ML) 0.083% IN NEBU: 2.5000 mg | INHALATION_SOLUTION | RESPIRATORY_TRACT | Status: DC | PRN

## 2023-11-03 MED ORDER — POLYETHYLENE GLYCOL 3350 17 G PO PACK: 17.0000 g | PACK | Freq: Every day | ORAL | Status: DC | PRN

## 2023-11-03 NOTE — Discharge Summary (Signed)
 Physician Discharge Summary  Cache Bills FMW:[id number] DOB: [birth year] DOA: 10/30/2023  PCP: Orpha Yancey LABOR, MD  Admit date: 10/30/2023 Discharge date: 11/03/2023  Disposition: SNF  Recommendations for Outpatient Follow-up:  Follow up with PCP in 2 weeks Please obtain BMP/CBC in 1-2 weeks Please monitor blood sugar at least 3 times per day per facility protocol  Home Health: SNF  Discharge Condition: STABLE   CODE STATUS: DNR DIET: heart healthy, carb modified    Brief Hospitalization Summary: Please see all hospital notes, images, labs for full details of the hospitalization. Admission provider HPI:  78 y.o. male with medical history significant of dementia, chronic diastolic heart failure, DM, and HTN and recent admission from 09/15/2023 -09/18/2023 for pneumonia and CHF exacerbation treated with antibiotics and diuretics presented with altered mental status and cough.  On presentation, CT of the chest showed bibasilar/multifocal pneumonia.  He was started on Rocephin  and Zithromax .  PT recommended SNF placement.  TOC consulted.   HOSPITAL COURSE BY LISTED PROBLEMS ADDRESSED  Multifocal pneumonia Acute respiratory failure with hypoxia - CT of the chest as above.  RSV/flu/COVID PCR negative.  Blood cultures negative so far.  Procalcitonin elevated at 20 - treated with IV Rocephin  and Zithromax  with plan to DC on oral doxycycline  to complete course  - Diet as per SLP recommendations - weaned to room air oxygen .  Acute metabolic encephalopathy--resolved  History of dementia Dysphagia -Has advanced dementia at baseline.  Patient is having more behavioral issues currently due to present illness.  Wife thinks he will need placement. -Monitor mental status.  Fall precautions - PT recommended SNF placement.  TOC consulted. - Continue memantine , donepezil  and trazodone  - Dysphagia 3 diet as per SLP recommendations   Chronic diastolic heart failure - Echo on 09/16/2023 had shown EF of  65 to 70%.  Currently compensated.  Strict input and output.  Daily weights.  Fluid restriction.  Diuretics on hold.   Hypertension Hyperlipidemia -Continue amlodipine , Coreg , terazosin .  Lasix  and spironolactone  on hold.  Monitor to resume diuretics on discharge. - Continue statin.  Fish oil  on hold for now.   Diabetes mellitus type 2 with hypoglycemia -continue CBGs with SSI.  Decrease dose of long-acting insulin  to 10 units twice daily.  Carb modified diet CBG (last 3)  Recent Labs    11/02/23 2227 11/03/23 0040 11/03/23 0714  GLUCAP 95 121* 115*    Leukocytosis -Resolved   Anemia of chronic disease -From chronic illnesses.  Hemoglobin stable.  Monitor intermittently   Chronic kidney disease stage IV - Baseline creatinine of 2.3-2.6.  Creatinine currently at at baseline.  Monitor.   Physical deconditioning - PT recommendations as above, SNF recommended, TOC involved - patient is discharging to SNF today   Obesity class II - Outpatient follow-up  Discharge Diagnoses:  Principal Problem:   Multifocal pneumonia Active Problems:   CKD (chronic kidney disease) stage 4, GFR 15-29 ml/min (HCC)   Diabetes mellitus without complication (HCC)   Hypertension   GERD (gastroesophageal reflux disease)   Class 2 obesity   Metabolic encephalopathy   Discharge Instructions:  Allergies as of 11/03/2023       Reactions   Penicillins Other (See Comments)   Unsure of reaction per patient. Per pt's wife, not allergic        Medication List     TAKE these medications    albuterol  (2.5 MG/3ML) 0.083% nebulizer solution Commonly known as: PROVENTIL  Take 3 mLs (2.5 mg total) by nebulization every 4 (four) hours  as needed for wheezing or shortness of breath.   allopurinol  100 MG tablet Commonly known as: ZYLOPRIM  Take 1 tablet by mouth daily.   amLODipine  5 MG tablet Commonly known as: NORVASC  Take 5 mg by mouth daily.   Aspirin  Low Dose 81 MG tablet Generic drug:  aspirin  EC Take 81 mg by mouth daily.   atorvastatin  20 MG tablet Commonly known as: LIPITOR Take 20 mg by mouth at bedtime.   carvedilol  25 MG tablet Commonly known as: COREG  Take 25 mg by mouth 2 (two) times daily.   cyanocobalamin  500 MCG tablet Commonly known as: VITAMIN B12 Take 1 tablet (500 mcg total) by mouth daily.   donepezil  10 MG tablet Commonly known as: ARICEPT  Take 10 mg by mouth every evening.   doxycycline  100 MG capsule Commonly known as: VIBRAMYCIN  Take 1 capsule (100 mg total) by mouth 2 (two) times daily for 3 days.   Fish Oil  1000 MG Caps Take 1,000 mg by mouth daily.   folic acid  1 MG tablet Commonly known as: FOLVITE  Take 1 tablet by mouth daily.   furosemide  20 MG tablet Commonly known as: LASIX  Take 1 tablet (20 mg total) by mouth every other day. Start taking on: November 04, 2023 What changed:  medication strength when to take this   memantine  10 MG tablet Commonly known as: NAMENDA  Take 10 mg by mouth 2 (two) times daily.   metoCLOPramide  5 MG tablet Commonly known as: REGLAN  Take 5 mg by mouth 3 (three) times daily.   nystatin  100000 UNIT/ML suspension Commonly known as: MYCOSTATIN  Take 15 mLs (1,500,000 Units total) by mouth 2 (two) times daily for 5 days.   pantoprazole  40 MG tablet Commonly known as: Protonix  Take 1 tablet (40 mg total) by mouth 2 (two) times daily.   polyethylene glycol 17 g packet Commonly known as: MIRALAX  / GLYCOLAX  Take 17 g by mouth daily as needed for mild constipation.   senna-docusate 8.6-50 MG tablet Commonly known as: Senokot-S Take 1 tablet by mouth at bedtime.   sodium bicarbonate  650 MG tablet Take 1 tablet (650 mg total) by mouth 2 (two) times daily.   spironolactone  25 MG tablet Commonly known as: ALDACTONE  Take 0.5 tablets (12.5 mg total) by mouth daily. Start taking on: November 04, 2023 What changed: how much to take   terazosin  5 MG capsule Commonly known as: HYTRIN  Take 5  mg by mouth at bedtime.   traZODone  100 MG tablet Commonly known as: DESYREL  Take 100 mg by mouth at bedtime.   Tresiba  FlexTouch 100 UNIT/ML FlexTouch Pen Generic drug: insulin  degludec Inject 10 Units into the skin daily. What changed:  how much to take when to take this        Contact information for follow-up providers     Orpha Yancey LABOR, MD. Schedule an appointment as soon as possible for a visit in 2 week(s).   Specialty: Internal Medicine Why: Hospital Follow Up Contact information: [address] [place] [address] [phone]              Contact information for after-discharge care     Destination     [facility]   Service: Skilled Nursing Contact information: [address] [place]  [address] [phone]                    Allergies  Allergen Reactions   Penicillins Other (See Comments)    Unsure of reaction per patient. Per  pt's wife, not allergic   Allergies as of 11/03/2023       Reactions   Penicillins Other (See Comments)   Unsure of reaction per patient. Per pt's wife, not allergic        Medication List     TAKE these medications    albuterol  (2.5 MG/3ML) 0.083% nebulizer solution Commonly known as: PROVENTIL  Take 3 mLs (2.5 mg total) by nebulization every 4 (four) hours as needed for wheezing or shortness of breath.   allopurinol  100 MG tablet Commonly known as: ZYLOPRIM  Take 1 tablet by mouth daily.   amLODipine  5 MG tablet Commonly known as: NORVASC  Take 5 mg by mouth daily.   Aspirin  Low Dose 81 MG tablet Generic drug: aspirin  EC Take 81 mg by mouth daily.   atorvastatin  20 MG tablet Commonly known as: LIPITOR Take 20 mg by mouth at bedtime.   carvedilol  25 MG tablet Commonly known as: COREG  Take 25 mg by mouth 2 (two) times daily.   cyanocobalamin  500 MCG tablet Commonly known as: VITAMIN B12 Take 1 tablet (500 mcg total) by mouth daily.   donepezil  10 MG  tablet Commonly known as: ARICEPT  Take 10 mg by mouth every evening.   doxycycline  100 MG capsule Commonly known as: VIBRAMYCIN  Take 1 capsule (100 mg total) by mouth 2 (two) times daily for 3 days.   Fish Oil  1000 MG Caps Take 1,000 mg by mouth daily.   folic acid  1 MG tablet Commonly known as: FOLVITE  Take 1 tablet by mouth daily.   furosemide  20 MG tablet Commonly known as: LASIX  Take 1 tablet (20 mg total) by mouth every other day. Start taking on: November 04, 2023 What changed:  medication strength when to take this   memantine  10 MG tablet Commonly known as: NAMENDA  Take 10 mg by mouth 2 (two) times daily.   metoCLOPramide  5 MG tablet Commonly known as: REGLAN  Take 5 mg by mouth 3 (three) times daily.   nystatin  100000 UNIT/ML suspension Commonly known as: MYCOSTATIN  Take 15 mLs (1,500,000 Units total) by mouth 2 (two) times daily for 5 days.   pantoprazole  40 MG tablet Commonly known as: Protonix  Take 1 tablet (40 mg total) by mouth 2 (two) times daily.   polyethylene glycol 17 g packet Commonly known as: MIRALAX  / GLYCOLAX  Take 17 g by mouth daily as needed for mild constipation.   senna-docusate 8.6-50 MG tablet Commonly known as: Senokot-S Take 1 tablet by mouth at bedtime.   sodium bicarbonate  650 MG tablet Take 1 tablet (650 mg total) by mouth 2 (two) times daily.   spironolactone  25 MG tablet Commonly known as: ALDACTONE  Take 0.5 tablets (12.5 mg total) by mouth daily. Start taking on: November 04, 2023 What changed: how much to take   terazosin  5 MG capsule Commonly known as: HYTRIN  Take 5 mg by mouth at bedtime.   traZODone  100 MG tablet Commonly known as: DESYREL  Take 100 mg by mouth at bedtime.   Tresiba  FlexTouch 100 UNIT/ML FlexTouch Pen Generic drug: insulin  degludec Inject 10 Units into the skin daily. What changed:  how much to take when to take this        Procedures/Studies: CT HEAD WO CONTRAST ( ) Result Date:  11/02/2023 CLINICAL DATA:  Left facial droop. EXAM: CT HEAD WITHOUT CONTRAST TECHNIQUE: Contiguous axial images were obtained from the base of the skull through the vertex without intravenous contrast. RADIATION DOSE REDUCTION: This exam was performed according to the departmental dose-optimization program which includes  automated exposure control, adjustment of the mA and/or kV according to patient size and/or use of iterative reconstruction technique. COMPARISON:  09/17/2023 FINDINGS: Brain: Cavum septum variant is present. Ventricles and cisterns are otherwise unremarkable. Mild prominence of the CSF spaces which is unchanged and likely due to age related atrophy. 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 There is no mass, mass effect, shift of midline structures or acute hemorrhage. No evidence of acute infarction. Vascular: No hyperdense vessel or unexpected calcification. Skull: Normal. Negative for fracture or focal lesion. Sinuses/Orbits: No acute finding. Other: None. IMPRESSION: 1. No acute findings. 2. 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 Electronically Signed   By: Toribio Agreste M.D.   On: 11/02/2023 08:15   CT Chest Wo Contrast Result Date: 10/30/2023 CLINICAL DATA:  Respiratory illness EXAM: CT CHEST WITHOUT CONTRAST TECHNIQUE: Multidetector CT imaging of the chest was performed following the standard protocol without IV contrast. RADIATION DOSE REDUCTION: This exam was performed according to the departmental dose-optimization program which includes automated exposure control, adjustment of the mA and/or kV according to patient size and/or use of iterative reconstruction technique. COMPARISON:  CT chest abdomen and pelvis 08/14/2023. FINDINGS: Cardiovascular: Heart is mildly enlarged. There is no pericardial effusion. The aorta is normal in size. Mediastinum/Nodes: There is a small hiatal hernia. Esophagus is within normal  limits. There is a hypodense right thyroid nodule measuring 10 mm. Enlarged subcarinal lymph node measures 14 mm, unchanged. No other enlarged lymph nodes are seen. There are calcified right hilar and subcarinal lymph nodes similar to prior. Lungs/Pleura: Patchy ground-glass and airspace opacities are seen throughout the bilateral lower lobes, right greater than left. There also mild ground-glass opacities in the bilateral upper lobes and right middle lobe. There is no pleural effusion or pneumothorax. Small slightly nodular 6 mm density seen along the posterior aspect of the distal trachea just above the bifurcation. Upper Abdomen: There is a 2.5 cm cyst in the left kidney. No acute abnormalities. Musculoskeletal: Degenerative changes affect the spine. IMPRESSION: 1. Patchy ground-glass and airspace opacities throughout the bilateral lower lobes, right greater than left. Findings are concerning for multifocal pneumonia. 2. Small slightly nodular density along the posterior aspect of the distal trachea just above the bifurcation. Findings may be related to small focal lesions or secretions. Consider direct visualization. 3. Stable enlarged subcarinal lymph node. 4. Small hiatal hernia. 5. 1 cm incidental right thyroid nodule. No follow-up imaging recommended. Electronically Signed   By: Greig Pique M.D.   On: 10/30/2023 16:41   DG Chest Portable 1 View Result Date: 10/30/2023 CLINICAL DATA:  Cough. EXAM: PORTABLE CHEST 1 VIEW COMPARISON:  09/15/2023. FINDINGS: Cardiopericardial silhouette is at upper limits of normal for size. Linear densities in both lung bases suggest atelectasis or scarring. No overt edema or focal consolidative opacity. No substantial pleural effusion. No acute bony abnormality. IMPRESSION: Bibasilar atelectasis or scarring. Electronically Signed   By: Camellia Candle M.D.   On: 10/30/2023 12:51     Subjective: No specific complaints, agreeable to going to STR  Discharge Exam: Vitals:    11/02/23 2231 11/03/23 0658  BP: 132/73 (!) 143/65  Pulse: 72 62  Resp: 18 20  Temp: 99.9 F (37.7 C) 98.6 F (37 C)  SpO2: 93% 96%   Vitals:   11/02/23 1335 11/02/23 2231 11/03/23 0452 11/03/23 0658  BP: (!) 140/68 132/73  (!) 143/65  Pulse: 69 72  62  Resp: 18 18  20  Temp: 98.9 F (37.2 C) 99.9 F (37.7 C)  98.6 F (37 C)  TempSrc: Axillary Oral  Oral  SpO2: 99% 93%  96%  Weight:   102.7 kg   Height:       General exam: Appears calm and comfortable  Respiratory system: BBS shallow but no increased work of breathing, no rales heard, Respiratory effort normal. Cardiovascular system: normal S1 & S2 heard. No JVD, murmurs, rubs, gallops or clicks. No pedal edema. Gastrointestinal system: Abdomen is nondistended, soft and nontender. No organomegaly or masses felt. Normal bowel sounds heard. Central nervous system: Alert and oriented. No focal neurological deficits. Extremities: Symmetric 5 x 5 power. Skin: No rashes, lesions or ulcers. Psychiatry: Judgement and insight appear normal. Mood & affect appropriate.    The results of significant diagnostics from this hospitalization (including imaging, microbiology, ancillary and laboratory) are listed below for reference.     Microbiology: Recent Results (from the past 240 hours)  Urine Culture     Status: Abnormal   Collection Time: 10/30/23  2:10 PM   Specimen: Urine, Random  Result Value Ref Range Status   Specimen Description   Final    URINE, RANDOM Performed at [facility], [address], [place], [place] [address]    Special Requests   Final    NONE Reflexed from [id number] Performed at [facility], [address], [place], [place] [address]    Culture 10,000 COLONIES/mL ENTEROCOCCUS FAECALIS (A)  Final   Report Status 11/03/2023 FINAL  Final   Organism ID, Bacteria ENTEROCOCCUS FAECALIS (A)  Final      Susceptibility   Enterococcus faecalis - MIC*    AMPICILLIN <=2 SENSITIVE Sensitive     NITROFURANTOIN <=16  SENSITIVE Sensitive     VANCOMYCIN 1 SENSITIVE Sensitive     * 10,000 COLONIES/mL ENTEROCOCCUS FAECALIS  Resp panel by RT-PCR (RSV, Flu A&B, Covid) Anterior Nasal Swab     Status: None   Collection Time: 10/30/23  2:52 PM   Specimen: Anterior Nasal Swab  Result Value Ref Range Status   SARS Coronavirus 2 by RT PCR NEGATIVE NEGATIVE Final    Comment: (NOTE) SARS-CoV-2 target nucleic acids are NOT DETECTED.  The SARS-CoV-2 RNA is generally detectable in upper respiratory specimens during the acute phase of infection. The lowest concentration of SARS-CoV-2 viral copies this assay can detect is 138 copies/mL. A negative result does not preclude SARS-Cov-2 infection and should not be used as the sole basis for treatment or other patient management decisions. A negative result may occur with  improper specimen collection/handling, submission of specimen other than nasopharyngeal swab, presence of viral mutation(s) within the areas targeted by this assay, and inadequate number of viral copies(<138 copies/mL). A negative result must be combined with clinical observations, patient history, and epidemiological information. The expected result is Negative.  Fact Sheet for Patients:  BloggerCourse.com  Fact Sheet for Healthcare Providers:  SeriousBroker.it  This test is no t yet approved or cleared by the [place]  FDA and  has been authorized for detection and/or diagnosis of SARS-CoV-2 by FDA under an Emergency Use Authorization (EUA). This EUA will remain  in effect (meaning this test can be used) for the duration of the COVID-19 declaration under Section 564(b)(1) of the Act, 21 U.S.C.section 360bbb-3(b)(1), unless the authorization is terminated  or revoked sooner.       Influenza A by PCR NEGATIVE NEGATIVE Final   Influenza B by PCR NEGATIVE NEGATIVE Final    Comment: (NOTE) The  Xpert Xpress SARS-CoV-2/FLU/RSV plus assay is  intended as an aid in the diagnosis of influenza from Nasopharyngeal swab specimens and should not be used as a sole basis for treatment. Nasal washings and aspirates are unacceptable for Xpert Xpress SARS-CoV-2/FLU/RSV testing.  Fact Sheet for Patients: BloggerCourse.com  Fact Sheet for Healthcare Providers: SeriousBroker.it  This test is not yet approved or cleared by the [place]  FDA and has been authorized for detection and/or diagnosis of SARS-CoV-2 by FDA under an Emergency Use Authorization (EUA). This EUA will remain in effect (meaning this test can be used) for the duration of the COVID-19 declaration under Section 564(b)(1) of the Act, 21 U.S.C. section 360bbb-3(b)(1), unless the authorization is terminated or revoked.     Resp Syncytial Virus by PCR NEGATIVE NEGATIVE Final    Comment: (NOTE) Fact Sheet for Patients: BloggerCourse.com  Fact Sheet for Healthcare Providers: SeriousBroker.it  This test is not yet approved or cleared by the [place]  FDA and has been authorized for detection and/or diagnosis of SARS-CoV-2 by FDA under an Emergency Use Authorization (EUA). This EUA will remain in effect (meaning this test can be used) for the duration of the COVID-19 declaration under Section 564(b)(1) of the Act, 21 U.S.C. section 360bbb-3(b)(1), unless the authorization is terminated or revoked.  Performed at [facility], [address], [place], [place] [address]   Blood culture (routine x 2)     Status: None (Preliminary result)   Collection Time: 10/30/23  5:36 PM   Specimen: Right Antecubital; Blood  Result Value Ref Range Status   Specimen Description RIGHT ANTECUBITAL  Final   Special Requests   Final    BOTTLES DRAWN AEROBIC AND ANAEROBIC Blood Culture adequate volume   Culture   Final    NO GROWTH 4 DAYS Performed at [facility], [address] [place], [place] [address]    Report Status PENDING  Incomplete  Blood culture (routine x 2)     Status: None (Preliminary result)   Collection Time: 10/30/23  5:36 PM   Specimen: Left Antecubital; Blood  Result Value Ref Range Status   Specimen Description LEFT ANTECUBITAL  Final   Special Requests   Final    BOTTLES DRAWN AEROBIC AND ANAEROBIC Blood Culture adequate volume   Culture   Final    NO GROWTH 4 DAYS Performed at [facility], [address] [place], [place] [address]    Report Status PENDING  Incomplete     Labs: BNP (last 3 results) Recent Labs    09/15/23 1045 10/30/23 1203  BNP 59.0 64.0   Basic Metabolic Panel: Recent Labs  Lab 10/30/23 1203 10/31/23 0426 11/01/23 0422 11/03/23 0404  NA 140 140 140 139  K 4.3 4.2 4.7 4.4  CL 106 106 104 104  CO2 24 24 24 27   GLUCOSE 107* 119* 98 92  BUN 34* 33* 28* 27*  CREATININE 2.85* 2.54* 2.36* 2.11*  CALCIUM  8.6* 8.5* 8.7* 8.7*  MG  --   --  1.7  --   PHOS  --   --   --  2.8   Liver Function Tests: Recent Labs  Lab 11/03/23 0404  ALBUMIN 2.5*   No results for input(s): LIPASE, AMYLASE in the last 168 hours. No results for input(s): AMMONIA in the last 168 hours. CBC: Recent Labs  Lab 10/30/23 1203 10/31/23 0426 11/01/23 0422  WBC 12.2* 8.3 7.9  NEUTROABS 9.5*  --  5.4  HGB 11.0* 9.7* 9.5*  HCT 34.0* 31.4*  31.3*  MCV 87.6 88.0 89.7  PLT 202 164 189   Cardiac Enzymes: No results for input(s): CKTOTAL, CKMB, CKMBINDEX, TROPONINI in the last 168 hours. BNP: Invalid input(s): POCBNP CBG: Recent Labs  Lab 11/02/23 1125 11/02/23 1630 11/02/23 2227 11/03/23 0040 11/03/23 0714  GLUCAP 114* 145* 95 121* 115*   D-Dimer No results for input(s): DDIMER in the last 72 hours. Hgb A1c No results for input(s): HGBA1C in the last 72 hours. Lipid Profile No results for input(s): CHOL, HDL, LDLCALC, TRIG, CHOLHDL, LDLDIRECT in the last 72 hours. Thyroid function  studies No results for input(s): TSH, T4TOTAL, T3FREE, THYROIDAB in the last 72 hours.  Invalid input(s): FREET3 Anemia work up No results for input(s): VITAMINB12, FOLATE, FERRITIN, TIBC, IRON, RETICCTPCT in the last 72 hours. Urinalysis    Component Value Date/Time   COLORURINE YELLOW 10/30/2023 1410   APPEARANCEUR CLEAR 10/30/2023 1410   LABSPEC 1.020 10/30/2023 1410   PHURINE 5.0 10/30/2023 1410   GLUCOSEU NEGATIVE 10/30/2023 1410   HGBUR NEGATIVE 10/30/2023 1410   BILIRUBINUR NEGATIVE 10/30/2023 1410   KETONESUR NEGATIVE 10/30/2023 1410   PROTEINUR NEGATIVE 10/30/2023 1410   NITRITE NEGATIVE 10/30/2023 1410   LEUKOCYTESUR SMALL (A) 10/30/2023 1410   Sepsis Labs Recent Labs  Lab 10/30/23 1203 10/31/23 0426 11/01/23 0422  WBC 12.2* 8.3 7.9   Microbiology Recent Results (from the past 240 hours)  Urine Culture     Status: Abnormal   Collection Time: 10/30/23  2:10 PM   Specimen: Urine, Random  Result Value Ref Range Status   Specimen Description   Final    URINE, RANDOM Performed at [facility], [address] [place], [place] [address]    Special Requests   Final    NONE Reflexed from [phone] Performed at [facility], [address], [place], [place] [address]    Culture 10,000 COLONIES/mL ENTEROCOCCUS FAECALIS (A)  Final   Report Status 11/03/2023 FINAL  Final   Organism ID, Bacteria ENTEROCOCCUS FAECALIS (A)  Final      Susceptibility   Enterococcus faecalis - MIC*    AMPICILLIN <=2 SENSITIVE Sensitive     NITROFURANTOIN <=16 SENSITIVE Sensitive     VANCOMYCIN 1 SENSITIVE Sensitive     * 10,000 COLONIES/mL ENTEROCOCCUS FAECALIS  Resp panel by RT-PCR (RSV, Flu A&B, Covid) Anterior Nasal Swab     Status: None   Collection Time: 10/30/23  2:52 PM   Specimen: Anterior Nasal Swab  Result Value Ref Range Status   SARS Coronavirus 2 by RT PCR NEGATIVE NEGATIVE Final    Comment: (NOTE) SARS-CoV-2 target nucleic acids are NOT  DETECTED.  The SARS-CoV-2 RNA is generally detectable in upper respiratory specimens during the acute phase of infection. The lowest concentration of SARS-CoV-2 viral copies this assay can detect is 138 copies/mL. A negative result does not preclude SARS-Cov-2 infection and should not be used as the sole basis for treatment or other patient management decisions. A negative result may occur with  improper specimen collection/handling, submission of specimen other than nasopharyngeal swab, presence of viral mutation(s) within the areas targeted by this assay, and inadequate number of viral copies(<138 copies/mL). A negative result must be combined with clinical observations, patient history, and epidemiological information. The expected result is Negative.  Fact Sheet for Patients:  BloggerCourse.com  Fact Sheet for Healthcare Providers:  SeriousBroker.it  This test is no t yet approved or cleared by the [place]  FDA and  has been authorized for detection and/or diagnosis of SARS-CoV-2  by FDA under an Emergency Use Authorization (EUA). This EUA will remain  in effect (meaning this test can be used) for the duration of the COVID-19 declaration under Section 564(b)(1) of the Act, 21 U.S.C.section 360bbb-3(b)(1), unless the authorization is terminated  or revoked sooner.       Influenza A by PCR NEGATIVE NEGATIVE Final   Influenza B by PCR NEGATIVE NEGATIVE Final    Comment: (NOTE) The Xpert Xpress SARS-CoV-2/FLU/RSV plus assay is intended as an aid in the diagnosis of influenza from Nasopharyngeal swab specimens and should not be used as a sole basis for treatment. Nasal washings and aspirates are unacceptable for Xpert Xpress SARS-CoV-2/FLU/RSV testing.  Fact Sheet for Patients: BloggerCourse.com  Fact Sheet for Healthcare Providers: SeriousBroker.it  This test is not yet  approved or cleared by the [place]  FDA and has been authorized for detection and/or diagnosis of SARS-CoV-2 by FDA under an Emergency Use Authorization (EUA). This EUA will remain in effect (meaning this test can be used) for the duration of the COVID-19 declaration under Section 564(b)(1) of the Act, 21 U.S.C. section 360bbb-3(b)(1), unless the authorization is terminated or revoked.     Resp Syncytial Virus by PCR NEGATIVE NEGATIVE Final    Comment: (NOTE) Fact Sheet for Patients: BloggerCourse.com  Fact Sheet for Healthcare Providers: SeriousBroker.it  This test is not yet approved or cleared by the [place]  FDA and has been authorized for detection and/or diagnosis of SARS-CoV-2 by FDA under an Emergency Use Authorization (EUA). This EUA will remain in effect (meaning this test can be used) for the duration of the COVID-19 declaration under Section 564(b)(1) of the Act, 21 U.S.C. section 360bbb-3(b)(1), unless the authorization is terminated or revoked.  Performed at [facility], [address] [place], [place] [address]   Blood culture (routine x 2)     Status: None (Preliminary result)   Collection Time: 10/30/23  5:36 PM   Specimen: Right Antecubital; Blood  Result Value Ref Range Status   Specimen Description RIGHT ANTECUBITAL  Final   Special Requests   Final    BOTTLES DRAWN AEROBIC AND ANAEROBIC Blood Culture adequate volume   Culture   Final    NO GROWTH 4 DAYS Performed at [facility], [address] [place], [place] [address]    Report Status PENDING  Incomplete  Blood culture (routine x 2)     Status: None (Preliminary result)   Collection Time: 10/30/23  5:36 PM   Specimen: Left Antecubital; Blood  Result Value Ref Range Status   Specimen Description LEFT ANTECUBITAL  Final   Special Requests   Final    BOTTLES DRAWN AEROBIC AND ANAEROBIC Blood Culture adequate volume   Culture   Final    NO  GROWTH 4 DAYS Performed at [facility], [address], [place], [place] [address]    Report Status PENDING  Incomplete   Time coordinating discharge:  40 mins  SIGNED:  Afton Louder, MD  [facility] 11/03/2023, 10:46 AM How to contact the [facility] Attending or Consulting provider 7A - 7P or covering provider during after hours 7P -7A, for this patient?  Check the care team in [facility] and look for a) attending/consulting TRH provider listed and b) the TRH team listed Log into [email or web address] and use [place]'s universal password to access. If you do not have the password, please contact the hospital operator. Locate the [facility] provider you are looking for under [facility] and page to a number that you can  be directly reached. If you still have difficulty reaching the provider, please page the [facility] (Director on Call) for the Hospitalists listed on amion for assistance.

## 2023-11-03 NOTE — Plan of Care (Signed)
  Problem: Health Behavior/Discharge Planning: Goal: Ability to manage health-related needs will improve Outcome: Progressing   Problem: Clinical Measurements: Goal: Ability to maintain clinical measurements within normal limits will improve Outcome: Progressing Goal: Will remain free from infection Outcome: Progressing Goal: Diagnostic test results will improve Outcome: Progressing Goal: Respiratory complications will improve Outcome: Progressing Goal: Cardiovascular complication will be avoided Outcome: Progressing   Problem: Activity: Goal: Risk for activity intolerance will decrease Outcome: Progressing   Problem: Nutrition: Goal: Adequate nutrition will be maintained Outcome: Progressing   Problem: Coping: Goal: Level of anxiety will decrease Outcome: Progressing   Problem: Elimination: Goal: Will not experience complications related to bowel motility Outcome: Progressing Goal: Will not experience complications related to urinary retention Outcome: Progressing   Problem: Pain Managment: Goal: General experience of comfort will improve and/or be controlled Outcome: Progressing   Problem: Safety: Goal: Ability to remain free from injury will improve Outcome: Progressing   Problem: Skin Integrity: Goal: Risk for impaired skin integrity will decrease Outcome: Progressing   Problem: Coping: Goal: Ability to adjust to condition or change in health will improve Outcome: Progressing   Problem: Metabolic: Goal: Ability to maintain appropriate glucose levels will improve Outcome: Progressing   Problem: Nutritional: Goal: Maintenance of adequate nutrition will improve Outcome: Progressing Goal: Progress toward achieving an optimal weight will improve Outcome: Progressing   Problem: Skin Integrity: Goal: Risk for impaired skin integrity will decrease Outcome: Progressing   Problem: Tissue Perfusion: Goal: Adequacy of tissue perfusion will improve Outcome:  Progressing   Problem: Activity: Goal: Ability to tolerate increased activity will improve Outcome: Progressing   Problem: Clinical Measurements: Goal: Ability to maintain a body temperature in the normal range will improve Outcome: Progressing

## 2023-11-03 NOTE — Discharge Instructions (Signed)
 IMPORTANT INFORMATION: PAY CLOSE ATTENTION  ? ?PHYSICIAN DISCHARGE INSTRUCTIONS ? ?Follow with Primary care provider  Toma Deiters, MD  and other consultants as instructed by your Hospitalist Physician ? ?SEEK MEDICAL CARE OR RETURN TO EMERGENCY ROOM IF SYMPTOMS COME BACK, WORSEN OR NEW PROBLEM DEVELOPS  ? ?Please note: ?You were cared for by a hospitalist during your hospital stay. Every effort will be made to forward records to your primary care provider.  You can request that your primary care provider send for your hospital records if they have not received them.  Once you are discharged, your primary care physician will handle any further medical issues. Please note that NO REFILLS for any discharge medications will be authorized once you are discharged, as it is imperative that you return to your primary care physician (or establish a relationship with a primary care physician if you do not have one) for your post hospital discharge needs so that they can reassess your need for medications and monitor your lab values. ? ?Please get a complete blood count and chemistry panel checked by your Primary MD at your next visit, and again as instructed by your Primary MD. ? ?Get Medicines reviewed and adjusted: ?Please take all your medications with you for your next visit with your Primary MD ? ?Laboratory/radiological data: ?Please request your Primary MD to go over all hospital tests and procedure/radiological results at the follow up, please ask your primary care provider to get all Hospital records sent to his/her office. ? ?In some cases, they will be blood work, cultures and biopsy results pending at the time of your discharge. Please request that your primary care provider follow up on these results. ? ?If you are diabetic, please bring your blood sugar readings with you to your follow up appointment with primary care.   ? ?Please call and make your follow up appointments as soon as possible.   ? ?Also Note  the following: ?If you experience worsening of your admission symptoms, develop shortness of breath, life threatening emergency, suicidal or homicidal thoughts you must seek medical attention immediately by calling 911 or calling your MD immediately  if symptoms less severe. ? ?You must read complete instructions/literature along with all the possible adverse reactions/side effects for all the Medicines you take and that have been prescribed to you. Take any new Medicines after you have completely understood and accpet all the possible adverse reactions/side effects.  ? ?Do not drive when taking Pain medications or sleeping medications (Benzodiazepines) ? ?Do not take more than prescribed Pain, Sleep and Anxiety Medications. It is not advisable to combine anxiety,sleep and pain medications without talking with your primary care practitioner ? ?Special Instructions: If you have smoked or chewed Tobacco  in the last 2 yrs please stop smoking, stop any regular Alcohol  and or any Recreational drug use. ? ?Wear Seat belts while driving.  Do not drive if taking any narcotic, mind altering or controlled substances or recreational drugs or alcohol.  ? ? ? ? ? ?

## 2023-11-03 NOTE — TOC Transition Note (Signed)
 Transition of Care [facility]) - Discharge Note   Patient Details  Name: Terry Stevens MRN: [id number] Date of Birth: 10-04-1945  Transition of Care [facility]) CM/SW Contact:  Hoy DELENA Bigness, LCSW Phone Number: 11/03/2023, 10:57 AM   Clinical Narrative:    Insurance authorization approved for SNF. Pt able to transfer to [facility] today. Pt also recommended for outpatient palliative care follow up at discharge. Spoke with pt's spouse and confirmed plan. Pt's spouse would like palliative care services through [facility]. Referral made to [facility] at [facility] for outpatient palliative care services.  Pt will be going to room 505-B at [facility]. RN to call report to [phone]. [facility] called for transport at 11:12am.    Final next level of care: Skilled Nursing Facility Barriers to Discharge: Barriers Resolved   Patient Goals and CMS Choice Patient states their goals for this hospitalization and ongoing recovery are:: For pt to be placed in facility CMS Medicare.gov Compare Post Acute Care list provided to:: Patient Represenative (must comment) Choice offered to / list presented to : Spouse Rives ownership interest in [facility].provided to:: Spouse    Discharge Placement PASRR number recieved: 10/31/23            Patient [facility] bed at: [facility] Patient to be transferred to facility by: [facility] Name of family member notified: Spouse, [name] Patient and family notified of of transfer: 11/03/23  Discharge Plan and Services Additional resources added to the After Visit Summary for   In-house Referral: Clinical Social Work Discharge Planning Services: NA Post Acute Care Choice: Skilled Nursing Facility, Nursing Home          DME Arranged: N/A DME Agency: NA                  Social Drivers of Health (SDOH) Interventions SDOH Screenings   Food Insecurity: No Food Insecurity (10/30/2023)  Housing: Low Risk  (10/30/2023)  Transportation Needs:  No Transportation Needs (10/30/2023)  Utilities: Not At Risk (10/30/2023)  Alcohol Screen: Low Risk  (03/13/2023)  Depression (PHQ2-9): Low Risk  (03/13/2023)  Financial Resource Strain: Low Risk  (03/13/2023)  Physical Activity: Inactive (03/13/2023)  Social Connections: Socially Integrated (10/30/2023)  Stress: No Stress Concern Present (03/13/2023)  Tobacco Use: Medium Risk (10/30/2023)  Health Literacy: Adequate Health Literacy (03/13/2023)     Readmission Risk Interventions     No data to display

## 2023-11-03 NOTE — Progress Notes (Signed)
 Called report to [facility] . Patient to transfer via ems to room 505b . Patient spouse has taken all patient belongings home with her. All required documentation given to ems crew at time of discharge

## 2023-11-04 LAB — CULTURE, BLOOD (ROUTINE X 2)
Culture: NO GROWTH
Culture: NO GROWTH
Special Requests: ADEQUATE
Special Requests: ADEQUATE

## 2023-11-18 ENCOUNTER — Encounter: Payer: Self-pay | Admitting: Gastroenterology

## 2023-11-22 DIAGNOSIS — E119 Type 2 diabetes mellitus without complications: Secondary | ICD-10-CM | POA: Diagnosis not present

## 2023-12-01 DIAGNOSIS — E1122 Type 2 diabetes mellitus with diabetic chronic kidney disease: Secondary | ICD-10-CM | POA: Diagnosis not present

## 2023-12-01 DIAGNOSIS — K219 Gastro-esophageal reflux disease without esophagitis: Secondary | ICD-10-CM | POA: Diagnosis not present

## 2023-12-01 DIAGNOSIS — I10 Essential (primary) hypertension: Secondary | ICD-10-CM | POA: Diagnosis not present

## 2023-12-01 DIAGNOSIS — I5042 Chronic combined systolic (congestive) and diastolic (congestive) heart failure: Secondary | ICD-10-CM | POA: Diagnosis not present

## 2023-12-01 DIAGNOSIS — N521 Erectile dysfunction due to diseases classified elsewhere: Secondary | ICD-10-CM | POA: Diagnosis not present

## 2023-12-01 DIAGNOSIS — Z6834 Body mass index (BMI) 34.0-34.9, adult: Secondary | ICD-10-CM | POA: Diagnosis not present

## 2023-12-01 DIAGNOSIS — N184 Chronic kidney disease, stage 4 (severe): Secondary | ICD-10-CM | POA: Diagnosis not present

## 2023-12-01 DIAGNOSIS — M109 Gout, unspecified: Secondary | ICD-10-CM | POA: Diagnosis not present

## 2023-12-01 DIAGNOSIS — F0150 Vascular dementia without behavioral disturbance: Secondary | ICD-10-CM | POA: Diagnosis not present

## 2023-12-06 ENCOUNTER — Inpatient Hospital Stay (HOSPITAL_COMMUNITY)
Admission: EM | Admit: 2023-12-06 | Discharge: 2023-12-08 | DRG: 377 | Disposition: A | Discharge status: Hospice | Attending: Hospitalist | Admitting: Hospitalist

## 2023-12-06 ENCOUNTER — Emergency Department (HOSPITAL_COMMUNITY)

## 2023-12-06 ENCOUNTER — Encounter (HOSPITAL_COMMUNITY): Payer: Self-pay

## 2023-12-06 ENCOUNTER — Other Ambulatory Visit: Payer: Self-pay

## 2023-12-06 DIAGNOSIS — K920 Hematemesis: Secondary | ICD-10-CM | POA: Diagnosis present

## 2023-12-06 DIAGNOSIS — N184 Chronic kidney disease, stage 4 (severe): Secondary | ICD-10-CM

## 2023-12-06 DIAGNOSIS — M109 Gout, unspecified: Secondary | ICD-10-CM | POA: Diagnosis present

## 2023-12-06 DIAGNOSIS — E1122 Type 2 diabetes mellitus with diabetic chronic kidney disease: Secondary | ICD-10-CM | POA: Diagnosis present

## 2023-12-06 DIAGNOSIS — Z96652 Presence of left artificial knee joint: Secondary | ICD-10-CM | POA: Diagnosis present

## 2023-12-06 DIAGNOSIS — K5900 Constipation, unspecified: Secondary | ICD-10-CM | POA: Diagnosis present

## 2023-12-06 DIAGNOSIS — I10 Essential (primary) hypertension: Secondary | ICD-10-CM | POA: Diagnosis not present

## 2023-12-06 DIAGNOSIS — R509 Fever, unspecified: Secondary | ICD-10-CM | POA: Diagnosis present

## 2023-12-06 DIAGNOSIS — Z87891 Personal history of nicotine dependence: Secondary | ICD-10-CM | POA: Diagnosis not present

## 2023-12-06 DIAGNOSIS — E119 Type 2 diabetes mellitus without complications: Secondary | ICD-10-CM | POA: Diagnosis not present

## 2023-12-06 DIAGNOSIS — I5032 Chronic diastolic (congestive) heart failure: Secondary | ICD-10-CM

## 2023-12-06 DIAGNOSIS — Z794 Long term (current) use of insulin: Secondary | ICD-10-CM

## 2023-12-06 DIAGNOSIS — N289 Disorder of kidney and ureter, unspecified: Secondary | ICD-10-CM

## 2023-12-06 DIAGNOSIS — K219 Gastro-esophageal reflux disease without esophagitis: Secondary | ICD-10-CM | POA: Diagnosis present

## 2023-12-06 DIAGNOSIS — Z880 Allergy status to penicillin: Secondary | ICD-10-CM | POA: Diagnosis not present

## 2023-12-06 DIAGNOSIS — J9601 Acute respiratory failure with hypoxia: Secondary | ICD-10-CM

## 2023-12-06 DIAGNOSIS — Z515 Encounter for palliative care: Secondary | ICD-10-CM

## 2023-12-06 DIAGNOSIS — Z66 Do not resuscitate: Secondary | ICD-10-CM | POA: Diagnosis present

## 2023-12-06 DIAGNOSIS — F0390 Unspecified dementia without behavioral disturbance: Secondary | ICD-10-CM | POA: Diagnosis present

## 2023-12-06 DIAGNOSIS — Z79899 Other long term (current) drug therapy: Secondary | ICD-10-CM

## 2023-12-06 DIAGNOSIS — K922 Gastrointestinal hemorrhage, unspecified: Secondary | ICD-10-CM | POA: Diagnosis not present

## 2023-12-06 DIAGNOSIS — I130 Hypertensive heart and chronic kidney disease with heart failure and stage 1 through stage 4 chronic kidney disease, or unspecified chronic kidney disease: Secondary | ICD-10-CM | POA: Diagnosis present

## 2023-12-06 DIAGNOSIS — E860 Dehydration: Secondary | ICD-10-CM | POA: Diagnosis present

## 2023-12-06 DIAGNOSIS — D696 Thrombocytopenia, unspecified: Secondary | ICD-10-CM | POA: Diagnosis present

## 2023-12-06 DIAGNOSIS — Z7982 Long term (current) use of aspirin: Secondary | ICD-10-CM

## 2023-12-06 DIAGNOSIS — R0902 Hypoxemia: Secondary | ICD-10-CM

## 2023-12-06 LAB — CBC WITH DIFFERENTIAL/PLATELET
Abs Immature Granulocytes: 0.01 K/uL (ref 0.00–0.07)
Basophils Absolute: 0 K/uL (ref 0.0–0.1)
Basophils Relative: 1 %
Eosinophils Absolute: 0.1 K/uL (ref 0.0–0.5)
Eosinophils Relative: 1 %
HCT: 38.1 % — ABNORMAL LOW (ref 39.0–52.0)
Hemoglobin: 11.7 g/dL — ABNORMAL LOW (ref 13.0–17.0)
Immature Granulocytes: 0 %
Lymphocytes Relative: 24 %
Lymphs Abs: 1.5 K/uL (ref 0.7–4.0)
MCH: 27.1 pg (ref 26.0–34.0)
MCHC: 30.7 g/dL (ref 30.0–36.0)
MCV: 88.4 fL (ref 80.0–100.0)
Monocytes Absolute: 0.5 K/uL (ref 0.1–1.0)
Monocytes Relative: 8 %
Neutro Abs: 4.3 K/uL (ref 1.7–7.7)
Neutrophils Relative %: 66 %
Platelets: 134 K/uL — ABNORMAL LOW (ref 150–400)
RBC: 4.31 MIL/uL (ref 4.22–5.81)
RDW: 15.6 % — ABNORMAL HIGH (ref 11.5–15.5)
WBC: 6.5 K/uL (ref 4.0–10.5)
nRBC: 0 % (ref 0.0–0.2)

## 2023-12-06 LAB — GLUCOSE, CAPILLARY
Glucose-Capillary: 126 mg/dL — ABNORMAL HIGH (ref 70–99)
Glucose-Capillary: 142 mg/dL — ABNORMAL HIGH (ref 70–99)
Glucose-Capillary: 151 mg/dL — ABNORMAL HIGH (ref 70–99)

## 2023-12-06 LAB — COMPREHENSIVE METABOLIC PANEL WITH GFR
ALT: 10 U/L (ref 0–44)
AST: 26 U/L (ref 15–41)
Albumin: 3.7 g/dL (ref 3.5–5.0)
Alkaline Phosphatase: 103 U/L (ref 38–126)
Anion gap: 10 (ref 5–15)
BUN: 17 mg/dL (ref 8–23)
CO2: 27 mmol/L (ref 22–32)
Calcium: 9.3 mg/dL (ref 8.9–10.3)
Chloride: 104 mmol/L (ref 98–111)
Creatinine, Ser: 2 mg/dL — ABNORMAL HIGH (ref 0.61–1.24)
GFR, Estimated: 34 mL/min — ABNORMAL LOW (ref >60)
Glucose, Bld: 163 mg/dL — ABNORMAL HIGH (ref 70–99)
Potassium: 4.4 mmol/L (ref 3.5–5.1)
Sodium: 141 mmol/L (ref 135–145)
Total Bilirubin: 0.7 mg/dL (ref 0.0–1.2)
Total Protein: 7.1 g/dL (ref 6.5–8.1)

## 2023-12-06 LAB — HEMOGLOBIN AND HEMATOCRIT, BLOOD
HCT: 37.7 % — ABNORMAL LOW (ref 39.0–52.0)
Hemoglobin: 11.3 g/dL — ABNORMAL LOW (ref 13.0–17.0)

## 2023-12-06 LAB — PROTIME-INR
INR: 1.1 (ref 0.8–1.2)
Prothrombin Time: 15.2 s (ref 11.4–15.2)

## 2023-12-06 LAB — TYPE AND SCREEN
ABO/RH(D): A POS
Antibody Screen: NEGATIVE

## 2023-12-06 LAB — CBG MONITORING, ED
Glucose-Capillary: 155 mg/dL — ABNORMAL HIGH (ref 70–99)
Glucose-Capillary: 138 mg/dL — ABNORMAL HIGH (ref 70–99)

## 2023-12-06 LAB — LACTIC ACID, PLASMA
Lactic Acid, Venous: 1.2 mmol/L (ref 0.5–1.9)
Lactic Acid, Venous: 1.1 mmol/L (ref 0.5–1.9)

## 2023-12-06 MED ORDER — SODIUM CHLORIDE 0.9% FLUSH
3.0000 mL | Freq: Two times a day (BID) | INTRAVENOUS | Status: DC
  Administered 2023-12-06 – 2023-12-08 (×5): 3 mL via INTRAVENOUS

## 2023-12-06 MED ORDER — TERAZOSIN HCL 5 MG PO CAPS
5.0000 mg | ORAL_CAPSULE | Freq: Every day | ORAL | Status: DC
  Filled 2023-12-06: qty 1

## 2023-12-06 MED ORDER — ONDANSETRON HCL 4 MG/2ML IJ SOLN: 4.0000 mg | Freq: Four times a day (QID) | INTRAMUSCULAR | Status: DC | PRN

## 2023-12-06 MED ORDER — PANTOPRAZOLE SODIUM 40 MG IV SOLR
80.0000 mg | Freq: Once | INTRAVENOUS | Status: AC
  Administered 2023-12-06: 80 mg via INTRAVENOUS
  Filled 2023-12-06: qty 20

## 2023-12-06 MED ORDER — ONDANSETRON HCL 4 MG PO TABS: 4.0000 mg | ORAL_TABLET | Freq: Four times a day (QID) | ORAL | Status: DC | PRN

## 2023-12-06 MED ORDER — ACETAMINOPHEN 325 MG PO TABS: 650.0000 mg | ORAL_TABLET | Freq: Four times a day (QID) | ORAL | Status: DC | PRN

## 2023-12-06 MED ORDER — DONEPEZIL HCL 5 MG PO TABS: 10.0000 mg | ORAL_TABLET | Freq: Every evening | ORAL | Status: DC

## 2023-12-06 MED ORDER — TRAZODONE HCL 50 MG PO TABS: 100.0000 mg | ORAL_TABLET | Freq: Every day | ORAL | Status: DC

## 2023-12-06 MED ORDER — ALLOPURINOL 100 MG PO TABS
100.0000 mg | ORAL_TABLET | Freq: Every day | ORAL | Status: DC
Start: 2023-12-06 — End: 2023-12-06
  Filled 2023-12-06: qty 1

## 2023-12-06 MED ORDER — SODIUM CHLORIDE 0.9 % IV SOLN: INTRAVENOUS | Status: DC

## 2023-12-06 MED ORDER — ONDANSETRON HCL 4 MG/2ML IJ SOLN
4.0000 mg | Freq: Once | INTRAMUSCULAR | Status: AC
  Administered 2023-12-06: 4 mg via INTRAVENOUS
  Filled 2023-12-06: qty 2

## 2023-12-06 MED ORDER — MEMANTINE HCL 10 MG PO TABS
10.0000 mg | ORAL_TABLET | Freq: Two times a day (BID) | ORAL | Status: DC
  Filled 2023-12-06: qty 1

## 2023-12-06 MED ORDER — INSULIN ASPART 100 UNIT/ML IJ SOLN
0.0000 [IU] | INTRAMUSCULAR | Status: DC
  Administered 2023-12-06: 1 [IU] via SUBCUTANEOUS
  Filled 2023-12-06: qty 1

## 2023-12-06 MED ORDER — BISACODYL 10 MG RE SUPP
10.0000 mg | Freq: Once | RECTAL | Status: AC
  Administered 2023-12-06: 10 mg via RECTAL
  Filled 2023-12-06: qty 1

## 2023-12-06 MED ORDER — ACETAMINOPHEN 650 MG RE SUPP: 650.0000 mg | Freq: Four times a day (QID) | RECTAL | Status: DC | PRN

## 2023-12-06 MED ORDER — ATORVASTATIN CALCIUM 10 MG PO TABS: 20.0000 mg | ORAL_TABLET | Freq: Every day | ORAL | Status: DC

## 2023-12-06 MED ORDER — PANTOPRAZOLE SODIUM 40 MG IV SOLR
40.0000 mg | Freq: Two times a day (BID) | INTRAVENOUS | Status: DC
  Administered 2023-12-06 – 2023-12-08 (×5): 40 mg via INTRAVENOUS
  Filled 2023-12-06 (×5): qty 10
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
## 2023-12-06 NOTE — ED Provider Notes (Signed)
 [place] EMERGENCY DEPARTMENT AT [facility] Provider Note   CSN: [id number] Arrival date & time: 12/06/23  9677     Patient presents with: Hematemesis   Terry Stevens is a 78 y.o. male.   The history is provided by the EMS personnel [facility] nurse). The history is limited by the condition of the patient (Dementia).   He has history of hypertension, diabetes, dementia, GERD and is currently in residential hospice and was sent here because of fever to 101, rapid heart rate of 120, elevated blood pressure, coffee-ground emesis x 4.  There is report that they were trying to do paperwork for DNR but it has not been completed.  Patient has no complaints.    Prior to Admission medications   Medication Sig Start Date End Date Taking? Authorizing Provider  albuterol  (PROVENTIL ) (2.5 MG/3ML) 0.083% nebulizer solution Take 3 mLs (2.5 mg total) by nebulization every 4 (four) hours as needed for wheezing or shortness of breath. 11/03/23   Johnson, Clanford L, MD  allopurinol  (ZYLOPRIM ) 100 MG tablet Take 1 tablet by mouth daily.    [provider]  amLODipine  (NORVASC ) 5 MG tablet Take 5 mg by mouth daily.    [provider]  ASPIRIN  LOW DOSE 81 MG tablet Take 81 mg by mouth daily. 08/26/23   [provider]  atorvastatin  (LIPITOR) 20 MG tablet Take 20 mg by mouth at bedtime.    [provider]  carvedilol  (COREG ) 25 MG tablet Take 25 mg by mouth 2 (two) times daily. 10/24/23   [provider]  donepezil  (ARICEPT ) 10 MG tablet Take 10 mg by mouth every evening.    [provider]  folic acid  (FOLVITE ) 1 MG tablet Take 1 tablet by mouth daily. 01/25/23   [provider]  furosemide  (LASIX ) 20 MG tablet Take 1 tablet (20 mg total) by mouth every other day. 11/04/23   Johnson, Clanford L, MD  memantine  (NAMENDA ) 10 MG tablet Take 10 mg by mouth 2 (two) times daily.    [provider]  metoCLOPramide  (REGLAN ) 5 MG tablet  Take 5 mg by mouth 3 (three) times daily. 10/24/23   [provider]  Omega-3 Fatty Acids (FISH OIL ) 1000 MG CAPS Take 1,000 mg by mouth daily.    [provider]  pantoprazole  (PROTONIX ) 40 MG tablet Take 1 tablet (40 mg total) by mouth 2 (two) times daily. 08/17/23   Pearlean Manus, MD  polyethylene glycol (MIRALAX  / GLYCOLAX ) 17 g packet Take 17 g by mouth daily as needed for mild constipation. 11/03/23   Johnson, Clanford L, MD  senna-docusate (SENOKOT-S) 8.6-50 MG tablet Take 1 tablet by mouth at bedtime. 11/03/23   Johnson, Clanford L, MD  sodium bicarbonate  650 MG tablet Take 1 tablet (650 mg total) by mouth 2 (two) times daily. 08/17/23   Pearlean Manus, MD  spironolactone  (ALDACTONE ) 25 MG tablet Take 0.5 tablets (12.5 mg total) by mouth daily. 11/04/23   Johnson, Clanford L, MD  terazosin  (HYTRIN ) 5 MG capsule Take 5 mg by mouth at bedtime.    [provider]  traZODone  (DESYREL ) 100 MG tablet Take 100 mg by mouth at bedtime. 10/26/23   [provider]  TRESIBA  FLEXTOUCH 100 UNIT/ML FlexTouch Pen Inject 10 Units into the skin daily. 11/03/23   Johnson, Clanford L, MD  vitamin B-12 (VITAMIN B12) 500 MCG tablet Take 1 tablet (500 mcg total) by mouth daily. 09/19/23   Evonnie Lenis, MD    Allergies: Penicillins  Review of Systems  Unable to perform ROS: Dementia    Updated Vital Signs BP (!) 147/83   Pulse 89   Temp 98.7 F (37.1 C) (Rectal)   Resp 17   SpO2 95%   Physical Exam Vitals and nursing note reviewed.   78 year old male, resting comfortably and in no acute distress. Vital signs are significant for elevated blood pressure. Oxygen  saturation is 95%, which is normal. Head is normocephalic and atraumatic. PERRLA, EOMI. Oropharynx is clear.  Conjunctivae are pink. Neck is nontender and supple. Lungs are clear without rales, wheezes, or rhonchi. Chest is nontender. Heart has regular rate and rhythm without murmur. Abdomen is soft, flat,  nontender. Extremities have trace edema. Skin is warm and dry without rash. Neurologic: Awake, oriented to person but not place or time.  Moves all extremities equally.  (all labs ordered are listed, but only abnormal results are displayed) Labs Reviewed - No data to display  EKG: EKG Interpretation Date/Time:  Tuesday December 06 2023 03:53:37 EDT Ventricular Rate:  87 PR Interval:  149 QRS Duration:  88 QT Interval:  365 QTC Calculation: 440 R Axis:   16  Text Interpretation: Sinus rhythm Abnormal R-wave progression, early transition When compared with ECG of 10/30/2023, No significant change was found Confirmed by Raford Lenis ([id number]) on 12/06/2023 4:13:40 AM  Radiology: No results found.  Cardiac monitor shows normal sinus rhythm, per my interpretation. Procedures   Medications Ordered in the ED - No data to display                                  Medical Decision Making Amount and/or Complexity of Data Reviewed Labs: ordered. Radiology: ordered.  Risk Prescription drug management.   Reported fever, tachycardia, hypertension, coffee-ground emesis.  On evaluation here, he has a normal heart rate and normal blood pressure and is afebrile.  However, based on history, I have initiated the evolving sepsis pathway and I have ordered blood for type and screen and I have ordered IV pantoprazole .  I reviewed his past records, and note hospitalization 10/30/2023-11/03/2023 for multifocal pneumonia with metabolic encephalopathy.  It is noted that his CODE STATUS was DNR at that time.  Patient's wife has arrived, confirms DNR status.  Apparently, he started vomiting in the afternoon, and emesis was clear.  However, started turning brown and then became overtly like coffee ground this evening.  He apparently has been refusing to take his medication since being released from skilled nursing facility about a week ago.  I have reviewed his electrocardiogram, and my interpretation is  early transition but no acute ST or T changes.  Patient vomited a small amount of material which was tested and found to be Gastroccult positive.  Differential diagnosis includes, but is not limited to, gastritis, Mallory-Weiss tear, esophageal varices, peptic ulcer disease.  The pattern of clear emesis which became coffee-ground is suggestive of Mallory-Weiss tear. I have reviewed his laboratory tests, and my interpretation is elevated random glucose level, stable renal insufficiency, mild anemia but hemoglobin 2 g higher than last value on 9/25, mild thrombocytopenia which is not felt to be clinically significant, normal lactic acid. He has remained hemodynamically stable, but did become hypoxic and required supplemental oxygen .  I feel he should be admitted for hemoglobin monitoring and monitoring respiratory status.  I discussed case with Dr. Charlton of [facility], who agrees to admit the patient.  CRITICAL  CARE Performed by: Alm Lias Total critical care time: 45 minutes Critical care time was exclusive of separately billable procedures and treating other patients. Critical care was necessary to treat or prevent imminent or life-threatening deterioration. Critical care was time spent personally by me on the following activities: development of treatment plan with patient and/or surrogate as well as nursing, discussions with consultants, evaluation of patient's response to treatment, examination of patient, obtaining history from patient or surrogate, ordering and performing treatments and interventions, ordering and review of laboratory studies, ordering and review of radiographic studies, pulse oximetry and re-evaluation of patient's condition.     Final diagnoses:  Upper gastrointestinal bleeding  Renal insufficiency  Thrombocytopenia  Hypoxia    ED Discharge Orders     None          Lias Alm, MD 12/06/23 [phone]

## 2023-12-06 NOTE — ED Triage Notes (Addendum)
 Pt via [facility], hospice pt c/o x4 coffee ground emesis tonight. Fever of 101 at home. Not c/o any pain.

## 2023-12-06 NOTE — Progress Notes (Signed)
 PROGRESS NOTE  Terry Stevens  DOB: 12-Jun-1945  PCP: Orpha Yancey LABOR, MD FMW:[id number]  DOA: 12/06/2023  LOS: 0 days  Hospital Day: 1  Subjective: Patient was seen and examined this morning. Elderly African-American male.  Propped up in bed.  Somnolent.  Tries to open eyes on command.  Wife at bedside.  Daughter on the phone. Remains hemodynamically stable. Per RN, patient is not cooperating to take his pills.  Brief narrative: Terry Stevens is a 78 y.o. male with PMH significant for dementia, DM 2, HTN, CHF, CKD 4, currently under hospice care. 10/28, brought to the ED for evaluation of coffee-ground emesis and fever. Most recently hospitalized 9/21-9/25 for multifocal pneumonia and discharged to SNF, subsequently discharged to home on 10/19.   Per report, she has been declining at home, has been refusing to take her medications, oral appetite and hydration has been declining as well.  Patient apparently has not had a bowel movement since discharge from the SNF.   Family took him to be seen by his PCP on 10/23 and was arranged home hospice services.   Symptoms continue at home.  The day prior to presentation, patient had several episodes of clear liquid emesis which later turned to coffee-ground appearance.  Hospice nurse also felt that patient had fever and hence sent to the ED.  In the ED, patient was afebrile, hemodynamically stable Labs with creatinine elevated to 2, normal WC count, lactic acid 1.2 Blood culture was collected Started on IV Protonix , IV Zofran  Admitted to [facility]  Assessment and plan: Coffee-ground emesis Intractable nausea, vomiting Constipation Reportedly had several episodes of clear liquid emesis which later turned coffee-ground.  No BM in at least last 10 days Hemoglobin stable at 11.7 Per wife at bedside, patient threw up once in the ED. IV PPI, IV antiemetics to continue Start clear liquid diet before he wants to eat but since he is somnolent, I would  start NS at 75 mL/h Ordered for Dulcolax suppository 1 dose today Aspirin  on hold.  I do not see a need to continue it in the hospital patient. Continue to monitor hemoglobin Recent Labs    06/08/23 1104 08/14/23 0848 09/17/23 0432 10/30/23 1203 10/31/23 0426 11/01/23 0422 12/06/23 0509 12/06/23 1003  HGB  --    < >  --  11.0* 9.7* 9.5* 11.7* 11.3*  MCV  --    < >  --  87.6 88.0 89.7 88.4  --   VITAMINB12 275  --  237  --   --   --   --   --   FOLATE >40.0  --  34.8  --   --   --   --   --   FERRITIN 98  --   --   --   --   --   --   --   TIBC 315  --   --   --   --   --   --   --   IRON 53  --   --   --   --   --   --   --    < > = values in this interval not displayed.   Fever at home Not febrile here.  WBC count normal. Continue to monitor Recent Labs  Lab 12/06/23 0342 12/06/23 0509  WBC  --  6.5  LATICACIDVEN 1.2 1.1   Acute hypoxic respiratory failure  O2 sat was 83% on room air in the ED.  CXR  is clear, RR is normal, and exam unremarkable   Per family, while in the SNF, patient was requiring supplemental O2 intermittently.  But was not requiring it at home Continue to wean down O2.   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  He is not cooperating to take oral pills.  I have discontinued nonessential oral medicines to minimize pill burden.    Goals of care   Code Status: Limited: Do not attempt resuscitation (DNR) -DNR-LIMITED -Do Not Intubate/DNI      DVT prophylaxis:  SCDs Start: 12/06/23 0549   Antimicrobials: None Fluid: NS at 75 mL/h Consultants: None Family Communication: Wife at  bedside  Status: Inpatient Level of care:  Telemetry   Patient is from: Home Needs to continue in-hospital care: Monitor for nausea, vomiting, bowel movement Anticipated d/c to: Hopefully home with home health services after symptoms improvement    Diet:  Diet Order             Diet clear liquid Room service appropriate? Yes; Fluid consistency: Thin  Diet effective now                   Scheduled Meds:  bisacodyl   10 mg Rectal Once   pantoprazole  (PROTONIX ) IV  40 mg Intravenous Q12H   sodium chloride  flush  3 mL Intravenous Q12H    PRN meds: acetaminophen  **OR** acetaminophen , ondansetron  **OR** ondansetron  (ZOFRAN ) IV   Infusions:   sodium chloride       Antimicrobials: Anti-infectives (From admission, onward)    None       Objective: Vitals:   12/06/23 0930 12/06/23 0935  BP: 117/63   Pulse:    Resp: 18   Temp:  98.6 F (37 C)  SpO2:      Intake/Output Summary (Last 24 hours) at 12/06/2023 1053 Last data filed at 12/06/2023 0939 Gross per 24 hour  Intake 3 ml  Output --  Net 3 ml   There were no vitals filed for this visit. Weight change:  There is no height or weight on file to calculate BMI.   Physical Exam: General exam: Elderly African-American male, not in distress Skin: No rashes, lesions or ulcers. HEENT: Atraumatic, normocephalic, no obvious bleeding Lungs: Clear to auscultation bilaterally, on low-flow oxygen  CVS: S1, S2, no murmur,   GI/Abd: Soft, nontender, nondistended, bowel sound present,   CNS: Somnolent, tries to open eyes on command. Psychiatry: Sad affect Extremities: No pedal edema, no calf tenderness,   Data Review: I have personally reviewed the laboratory data and studies available.  F/u labs ordered Unresulted Labs (From admission, onward)     Start     Ordered   12/07/23 0500  Basic metabolic panel  Daily,   R      12/06/23 0548   12/07/23 0500  CBC with Differential/Platelet  Tomorrow morning,   R         12/06/23 1053   12/06/23 0416  Occult Blood, Gastric Fluid (cup to lab)  Once,   URGENT        12/06/23 0415  12/06/23 0340  CBC with Differential  (Undifferentiated presentation (screening labs and basic nursing orders))  ONCE - STAT,   STAT        12/06/23 0340   12/06/23 0340  Urinalysis, w/ Reflex to Culture (Infection Suspected) -Urine, Clean Catch  (Undifferentiated presentation (screening labs and basic nursing orders))  ONCE - URGENT,   URGENT       Question:  Specimen Source  Answer:  Urine, Clean Catch   12/06/23 0340            Signed, Chapman Rota, MD [facility] 12/06/2023

## 2023-12-06 NOTE — H&P (Signed)
 History and Physical    Terry Stevens FMW:[id number] DOB: [birth year] DOA: 12/06/2023  PCP: Orpha Yancey LABOR, MD   Patient coming from: Home   Chief Complaint: Coffee-ground emesis, fever   HPI: Terry Stevens is a 78 y.o. male with medical history significant for dementia, insulin -dependent diabetes mellitus, hypertension, CKD stage IV, and chronic HFpEF who presents for evaluation of coffee-ground emesis and fever.  Patient was admitted to the hospital last month with multifocal pneumonia, was discharged to an SNF on 11/03/2023, and has been home now for several days.  He has continued to cough ever since the recent hospitalization, has consistently refused to take any of his medications, has not been eating or drinking much despite encouragement, and has enrolled in home hospice.  The night before last, he was too weak to make it to the bedroom on his own.  He then stayed in bed all day yesterday.  He was noted to spit up or vomit clear liquids several times before the liquid became dark with a coffee-ground appearance.  He has had multiple episodes of coffee-ground emesis and the hospice nurse reports that he had a fever as well.  The patient has not expressed any specific complaints but seem to be hurting all over when his wife was attempting to change his clothes.  ED Course: Upon arrival to the ED, patient is found to be afebrile and saturating adequately on room air initially with normal RR, normal HR, and stable BP.  Labs are most notable for normal BUN, creatinine 2.00, normal WBC, hemoglobin 11.7, and lactic acid 1.2.  Blood cultures were collected and the patient was treated with IV Protonix  and Zofran  in the ED.  Supplemental oxygen  was administered via nasal cannula.  Review of Systems:  ROS limited by patient's clinical condition.  Past Medical History:  Diagnosis Date   Arthritis    Constipation    Dementia (HCC)    Diabetes mellitus without complication (HCC)    GERD  (gastroesophageal reflux disease)    tums prn   Gout    Hypertension    Osteoarthritis of left knee 02/27/2013    Past Surgical History:  Procedure Laterality Date   CATARACT EXTRACTION W/PHACO Right 06/13/2020   Procedure: CATARACT EXTRACTION PHACO AND INTRAOCULAR LENS PLACEMENT RIGHT EYE;  Surgeon: Harrie Agent, MD;  Location: [facility];  Service: Ophthalmology;  Laterality: Right;  CDE  8.92   CATARACT EXTRACTION W/PHACO Left 06/27/2020   Procedure: CATARACT EXTRACTION PHACO AND INTRAOCULAR LENS PLACEMENT LEFT EYE;  Surgeon: Harrie Agent, MD;  Location: [facility];  Service: Ophthalmology;  Laterality: Left;  left,  CDE=8.21   ESOPHAGOGASTRODUODENOSCOPY (EGD) WITH PROPOFOL  N/A 02/21/2023   Procedure: ESOPHAGOGASTRODUODENOSCOPY (EGD) WITH PROPOFOL ;  Surgeon: Cindie Carlin POUR, DO;  Location: [facility];  Service: Endoscopy;  Laterality: N/A;   HOT HEMOSTASIS  02/21/2023   Procedure: HOT HEMOSTASIS (ARGON PLASMA COAGULATION/BICAP);  Surgeon: Cindie Carlin POUR, DO;  Location: [facility];  Service: Endoscopy;;   NO PAST SURGERIES     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 His smoking use included cigarettes. He has been exposed to tobacco smoke. He has never used smokeless tobacco. He reports that he does not drink alcohol and does not use drugs.  Allergies  Allergen Reactions   Penicillins  Other (See Comments)    Unsure of reaction per patient. Per pt's wife, not allergic    History reviewed. No pertinent family history.   Prior to Admission medications   Medication Sig Start Date End Date Taking? Authorizing Provider  albuterol  (PROVENTIL ) (2.5 MG/3ML) 0.083% nebulizer solution Take 3 mLs (2.5 mg total) by nebulization every 4 (four) hours as needed for wheezing or shortness of  breath. 11/03/23   Johnson, Clanford L, MD  allopurinol  (ZYLOPRIM ) 100 MG tablet Take 1 tablet by mouth daily.    [provider]  amLODipine  (NORVASC ) 5 MG tablet Take 5 mg by mouth daily.    [provider]  ASPIRIN  LOW DOSE 81 MG tablet Take 81 mg by mouth daily. 08/26/23   [provider]  atorvastatin  (LIPITOR) 20 MG tablet Take 20 mg by mouth at bedtime.    [provider]  carvedilol  (COREG ) 25 MG tablet Take 25 mg by mouth 2 (two) times daily. 10/24/23   [provider]  donepezil  (ARICEPT ) 10 MG tablet Take 10 mg by mouth every evening.    [provider]  folic acid  (FOLVITE ) 1 MG tablet Take 1 tablet by mouth daily. 01/25/23   [provider]  furosemide  (LASIX ) 20 MG tablet Take 1 tablet (20 mg total) by mouth every other day. 11/04/23   Johnson, Clanford L, MD  memantine  (NAMENDA ) 10 MG tablet Take 10 mg by mouth 2 (two) times daily.    [provider]  metoCLOPramide  (REGLAN ) 5 MG tablet Take 5 mg by mouth 3 (three) times daily. 10/24/23   [provider]  Omega-3 Fatty Acids (FISH OIL ) 1000 MG CAPS Take 1,000 mg by mouth daily.    [provider]  pantoprazole  (PROTONIX ) 40 MG tablet Take 1 tablet (40 mg total) by mouth 2 (two) times daily. 08/17/23   Pearlean Manus, MD  polyethylene glycol (MIRALAX  / GLYCOLAX ) 17 g packet Take 17 g by mouth daily as needed for mild constipation. 11/03/23   Johnson, Clanford L, MD  senna-docusate (SENOKOT-S) 8.6-50 MG tablet Take 1 tablet by mouth at bedtime. 11/03/23   Johnson, Clanford L, MD  sodium bicarbonate  650 MG tablet Take 1 tablet (650 mg total) by mouth 2 (two) times daily. 08/17/23   Pearlean Manus, MD  spironolactone  (ALDACTONE ) 25 MG tablet Take 0.5 tablets (12.5 mg total) by mouth daily. 11/04/23   Johnson, Clanford L, MD  terazosin  (HYTRIN ) 5 MG capsule Take 5 mg by mouth at bedtime.    [provider]  traZODone  (DESYREL ) 100 MG tablet Take 100  mg by mouth at bedtime. 10/26/23   [provider]  TRESIBA  FLEXTOUCH 100 UNIT/ML FlexTouch Pen Inject 10 Units into the skin daily. 11/03/23   Johnson, Clanford L, MD  vitamin B-12 (VITAMIN B12) 500 MCG tablet Take 1 tablet (500 mcg total) by mouth daily. 09/19/23   Evonnie Lenis, MD    Physical Exam: Vitals:   12/06/23 0345 12/06/23 0406 12/06/23 0415 12/06/23 0418  BP: (!) 148/102  (!) 147/83   Pulse:  88 89   Resp: 17 19 17    Temp:    98.7 F (37.1 C)  TempSrc:    Rectal  SpO2:  (!) 83% 95%     Constitutional: NAD, no pallor or diaphoresis   Eyes: PERTLA, lids and conjunctivae normal ENMT: Mucous membranes are moist. Posterior pharynx clear of any exudate or lesions.   Neck: supple, no masses  Respiratory: no wheezing, no crackles. No accessory  muscle use.  Cardiovascular: S1 & S2 heard, regular rate and rhythm. Bilateral lower extremity edema.  Abdomen: No tenderness, soft. Bowel sounds active.  Musculoskeletal: no clubbing / cyanosis. No joint deformity upper and lower extremities.   Skin: no significant rashes, lesions, ulcers. Warm, dry, well-perfused. Neurologic: CN 2-12 grossly intact. Moving all extremities. Sleeping, wakes to voice and oriented to person.  Psychiatric: Calm. Cooperative.    Labs and Imaging on Admission: I have personally reviewed following labs and imaging studies  CBC: Recent Labs  Lab 12/06/23 0509  WBC 6.5  NEUTROABS 4.3  HGB 11.7*  HCT 38.1*  MCV 88.4  PLT 134*   Basic Metabolic Panel: Recent Labs  Lab 12/06/23 0338  NA 141  K 4.4  CL 104  CO2 27  GLUCOSE 163*  BUN 17  CREATININE 2.00*  CALCIUM  9.3   GFR: CrCl cannot be calculated (Unknown ideal weight.). Liver Function Tests: Recent Labs  Lab 12/06/23 0338  AST 26  ALT 10  ALKPHOS 103  BILITOT 0.7  PROT 7.1  ALBUMIN 3.7   No results for input(s): LIPASE, AMYLASE in the last 168 hours. No results for input(s): AMMONIA in the last 168 hours. Coagulation  Profile: Recent Labs  Lab 12/06/23 0338  INR 1.1   Cardiac Enzymes: No results for input(s): CKTOTAL, CKMB, CKMBINDEX, TROPONINI in the last 168 hours. BNP (last 3 results) No results for input(s): PROBNP in the last 8760 hours. HbA1C: No results for input(s): HGBA1C in the last 72 hours. CBG: No results for input(s): GLUCAP in the last 168 hours. Lipid Profile: No results for input(s): CHOL, HDL, LDLCALC, TRIG, CHOLHDL, LDLDIRECT in the last 72 hours. Thyroid Function Tests: No results for input(s): TSH, T4TOTAL, FREET4, T3FREE, THYROIDAB in the last 72 hours. Anemia Panel: No results for input(s): VITAMINB12, FOLATE, FERRITIN, TIBC, IRON, RETICCTPCT in the last 72 hours. Urine analysis:    Component Value Date/Time   COLORURINE YELLOW 10/30/2023 1410   APPEARANCEUR CLEAR 10/30/2023 1410   LABSPEC 1.020 10/30/2023 1410   PHURINE 5.0 10/30/2023 1410   GLUCOSEU NEGATIVE 10/30/2023 1410   HGBUR NEGATIVE 10/30/2023 1410   BILIRUBINUR NEGATIVE 10/30/2023 1410   KETONESUR NEGATIVE 10/30/2023 1410   PROTEINUR NEGATIVE 10/30/2023 1410   NITRITE NEGATIVE 10/30/2023 1410   LEUKOCYTESUR SMALL (A) 10/30/2023 1410   Sepsis Labs: @LABRCNTIP (procalcitonin:4,lacticidven:4) )No results found for this or any previous visit (from the past 240 hours).   Radiological Exams on Admission: DG Chest Port 1 View Result Date: 12/06/2023 EXAM: 1 VIEW XRAY OF THE CHEST 12/06/2023 04:06:00 AM COMPARISON: None available. CLINICAL HISTORY: Questionable sepsis - evaluate for abnormality. FINDINGS: LUNGS AND PLEURA: Low lung volumes. Resultant perihilar bronchovascular crowding. No focal pulmonary opacity. No pulmonary edema. No pleural effusion. No pneumothorax. HEART AND MEDIASTINUM: Mild cardiomegaly. BONES AND SOFT TISSUES: No acute osseous abnormality. IMPRESSION: 1. No acute findings. Pulmonary hypoinflation. Mild cardiomegaly. Electronically signed by:  Dorethia Molt MD 12/06/2023 04:11 AM EDT RP Workstation: [id number]    EKG: Independently reviewed. Sinus rhythm.   Assessment/Plan   1. Coffee-ground emesis - He is hemodynamically stable and initial Hgb is 11.7  - Continue bowel-rest and IV PPI, hold ASA, trend H&H    2. Acute hypoxic respiratory failure  - CXR is clear, RR is normal, and exam unremarkable   - He was requiring supplemental O2 intermittently throughout the recent SNF stay per family  - Consider repeating CXR or checking CT if hypoxia persists  3. Fever at home  -  Hospice Nurse reports that he was febrile at home  - He has been afebrile here with normal WBC, will continue to monitor    4. Type II DM  - A1c was 7.5% in July 2025  - Check CBGs and use low-intensity SSI for now    5. Hypertension  - Treat as-needed only for now    6. Chronic HFpEF  - Appears compensated  - Hold diuretics while NPO, monitor volume status   7. CKD IV  - Appears to be at baseline  - Renally-dose medications   8. Dementia  - Continue Aricept  and Namenda , use delirium precautions     DVT prophylaxis: SCDs  Code Status: DNR/DNI  Level of Care: Level of care: Telemetry Family Communication: Wife at bedside  Disposition Plan:  Patient is from: Home  Anticipated d/c is to: TBD Anticipated d/c date is: 12/08/23 Patient currently: Pending stable H&H, improved oxygenation or home O2  Consults called: None  Admission status: Inpatient    Evalene GORMAN Sprinkles, MD [facility]  12/06/2023, 5:49 AM

## 2023-12-07 DIAGNOSIS — K922 Gastrointestinal hemorrhage, unspecified: Secondary | ICD-10-CM | POA: Diagnosis not present

## 2023-12-07 LAB — GLUCOSE, CAPILLARY
Glucose-Capillary: 110 mg/dL — ABNORMAL HIGH (ref 70–99)
Glucose-Capillary: 131 mg/dL — ABNORMAL HIGH (ref 70–99)
Glucose-Capillary: 137 mg/dL — ABNORMAL HIGH (ref 70–99)
Glucose-Capillary: 140 mg/dL — ABNORMAL HIGH (ref 70–99)
Glucose-Capillary: 145 mg/dL — ABNORMAL HIGH (ref 70–99)
Glucose-Capillary: 149 mg/dL — ABNORMAL HIGH (ref 70–99)

## 2023-12-07 LAB — BASIC METABOLIC PANEL WITH GFR
Anion gap: 9 (ref 5–15)
BUN: 20 mg/dL (ref 8–23)
CO2: 25 mmol/L (ref 22–32)
Calcium: 8.7 mg/dL — ABNORMAL LOW (ref 8.9–10.3)
Chloride: 107 mmol/L (ref 98–111)
Creatinine, Ser: 1.98 mg/dL — ABNORMAL HIGH (ref 0.61–1.24)
GFR, Estimated: 34 mL/min — ABNORMAL LOW (ref >60)
Glucose, Bld: 143 mg/dL — ABNORMAL HIGH (ref 70–99)
Potassium: 4.6 mmol/L (ref 3.5–5.1)
Sodium: 140 mmol/L (ref 135–145)

## 2023-12-07 LAB — CBC WITH DIFFERENTIAL/PLATELET
Abs Immature Granulocytes: 0.01 K/uL (ref 0.00–0.07)
Basophils Absolute: 0 K/uL (ref 0.0–0.1)
Basophils Relative: 0 %
Eosinophils Absolute: 0.1 K/uL (ref 0.0–0.5)
Eosinophils Relative: 1 %
HCT: 40.3 % (ref 39.0–52.0)
Hemoglobin: 11.8 g/dL — ABNORMAL LOW (ref 13.0–17.0)
Immature Granulocytes: 0 %
Lymphocytes Relative: 19 %
Lymphs Abs: 1.3 K/uL (ref 0.7–4.0)
MCH: 27.3 pg (ref 26.0–34.0)
MCHC: 29.3 g/dL — ABNORMAL LOW (ref 30.0–36.0)
MCV: 93.1 fL (ref 80.0–100.0)
Monocytes Absolute: 0.5 K/uL (ref 0.1–1.0)
Monocytes Relative: 7 %
Neutro Abs: 5.3 K/uL (ref 1.7–7.7)
Neutrophils Relative %: 73 %
Platelets: 121 K/uL — ABNORMAL LOW (ref 150–400)
RBC: 4.33 MIL/uL (ref 4.22–5.81)
RDW: 15.7 % — ABNORMAL HIGH (ref 11.5–15.5)
WBC: 7.1 K/uL (ref 4.0–10.5)
nRBC: 0 % (ref 0.0–0.2)

## 2023-12-07 LAB — URINALYSIS, W/ REFLEX TO CULTURE (INFECTION SUSPECTED)
Bilirubin Urine: NEGATIVE
Glucose, UA: NEGATIVE mg/dL
Hgb urine dipstick: NEGATIVE
Ketones, ur: NEGATIVE mg/dL
Leukocytes,Ua: NEGATIVE
Nitrite: NEGATIVE
Protein, ur: NEGATIVE mg/dL
Specific Gravity, Urine: 1.019 (ref 1.005–1.030)
pH: 6 (ref 5.0–8.0)

## 2023-12-07 MED ORDER — BISACODYL 10 MG RE SUPP
10.0000 mg | Freq: Every day | RECTAL | Status: DC | PRN
  Administered 2023-12-07: 10 mg via RECTAL
  Filled 2023-12-07: qty 1

## 2023-12-07 MED ORDER — POLYETHYLENE GLYCOL 3350 17 G PO PACK
17.0000 g | PACK | Freq: Every day | ORAL | Status: DC
  Administered 2023-12-07 – 2023-12-08 (×2): 17 g via ORAL
  Filled 2023-12-07 (×2): qty 1

## 2023-12-07 MED ORDER — SENNOSIDES-DOCUSATE SODIUM 8.6-50 MG PO TABS
1.0000 | ORAL_TABLET | Freq: Two times a day (BID) | ORAL | Status: DC
  Administered 2023-12-07 – 2023-12-08 (×2): 1 via ORAL
  Filled 2023-12-07 (×2): qty 1

## 2023-12-07 NOTE — Progress Notes (Addendum)
 PROGRESS NOTE  Terry Stevens  DOB: [birth year]  PCP: Orpha Yancey LABOR, MD FMW:[id number]  DOA: 12/06/2023  LOS: 1 day  Hospital Day: 2  Subjective: Patient was seen and examined this morning. Seen second time when wife arrived at the bedside. He is not in any acute distress, on supplemental oxygen  2 L/min.  He denies nausea.  He has not eaten much.  Only taking Jell-O and liquid.  Denies abdominal pain.  Brief narrative: Terry Stevens is a 78 y.o. male with PMH significant for dementia, DM 2, HTN, CHF, CKD 4, currently under hospice care. 10/28, brought to the ED for evaluation of coffee-ground emesis and fever. Most recently hospitalized 9/21-9/25 for multifocal pneumonia and discharged to SNF, subsequently discharged to home on 10/19.   Per report, she has been declining at home, has been refusing to take her medications, oral appetite and hydration has been declining as well.  Patient apparently has not had a bowel movement since discharge from the SNF.   Family took him to be seen by his PCP on 10/23 and was arranged home hospice services.   Symptoms continue at home.  The day prior to presentation, patient had several episodes of clear liquid emesis which later turned to coffee-ground appearance.  Hospice nurse also felt that patient had fever and hence sent to the ED.  In the ED, patient was afebrile, hemodynamically stable Labs with creatinine elevated to 2, normal WC count, lactic acid 1.2 Blood culture was collected Started on IV Protonix , IV Zofran  Admitted to [facility]  Assessment and plan: Coffee-ground emesis Intractable nausea, vomiting Constipation Reportedly had several episodes of clear liquid emesis which later turned coffee-ground.  No BM in at least last 10 days Hemoglobin stable at 11.7 Vomiting is resolved.  Patient has not eaten much.  Taking only Jell-O.  Denies any abdominal pain. Continues to be on IV fluid for hydration--> will DC today. Will continue Dulcolax  suppository as needed, MiraLAX  and Senokot-S. Per wife at the bedside, she has hospital bed pending at home.  She does not want to discharge today.  Recent Labs    06/08/23 1104 08/14/23 0848 09/17/23 0432 10/30/23 1203 10/31/23 0426 11/01/23 0422 12/06/23 0509 12/06/23 1003 12/07/23 0420  HGB  --    < >  --    < > 9.7* 9.5* 11.7* 11.3* 11.8*  MCV  --    < >  --    < > 88.0 89.7 88.4  --  93.1  VITAMINB12 275  --  237  --   --   --   --   --   --   FOLATE >40.0  --  34.8  --   --   --   --   --   --   FERRITIN 98  --   --   --   --   --   --   --   --   TIBC 315  --   --   --   --   --   --   --   --   IRON 53  --   --   --   --   --   --   --   --    < > = values in this interval not displayed.   Fever at home Not febrile here.  WBC count normal. Continue to monitor Recent Labs  Lab 12/06/23 0342 12/06/23 0509 12/07/23 0420  WBC  --  6.5 7.1  LATICACIDVEN 1.2 1.1  --    Acute hypoxic respiratory failure  O2 sat was 83% on room air in the ED.  CXR is clear, RR is normal, and exam unremarkable   Per family, while in the SNF, patient was requiring supplemental O2 intermittently.  But was not requiring it at home Continue to wean down O2.   Consider repeating CXR or checking CT if hypoxia persists.  Currently oxygen  2 L/min.  Type II DM  A1c was 7.5% in July 2025  Check CBGs and use low-intensity SSI for now     Hypertension  Treat as-needed only for now     Chronic HFpEF  Appears compensated  Continue to hold diuretics, monitor volume status. Will DC IV fluid today  CKD IV  Appears to be at baseline  Renally-dose medications   Patient is already established with home hospice services.  He is not cooperating to take oral pills.  I have discontinued nonessential oral medicines to minimize pill burden.    Goals of care   Code Status: Limited: Do not attempt resuscitation (DNR) -DNR-LIMITED -Do Not Intubate/DNI      DVT prophylaxis:  SCDs Start: 12/06/23  0549   Antimicrobials: None Fluid: NS at 75 mL/h is discontinued Consultants: None Family Communication: Wife at bedside  Status: Inpatient Level of care:  Telemetry   Patient is from: Home Needs to continue in-hospital care: Monitor for nausea, vomiting, bowel movement Anticipated d/c to: Hopefully home with home health services/hospice after symptoms improvement    Diet:  Diet Order             Diet clear liquid Room service appropriate? Yes; Fluid consistency: Thin  Diet effective now                   Scheduled Meds:  pantoprazole  (PROTONIX ) IV  40 mg Intravenous Q12H   sodium chloride  flush  3 mL Intravenous Q12H    PRN meds: acetaminophen  **OR** acetaminophen , ondansetron  **OR** ondansetron  (ZOFRAN ) IV   Infusions:   sodium chloride  75 mL/hr at 12/07/23 0058    Antimicrobials: Anti-infectives (From admission, onward)    None       Objective: Vitals:   12/07/23 0432 12/07/23 1313  BP: 138/64 134/84  Pulse: 60 63  Resp:  17  Temp: 97.7 F (36.5 C) 97.8 F (36.6 C)  SpO2: 99% 99%    Intake/Output Summary (Last 24 hours) at 12/07/2023 1534 Last data filed at 12/07/2023 1300 Gross per 24 hour  Intake 120 ml  Output 850 ml  Net -730 ml   There were no vitals filed for this visit. Weight change:  There is no height or weight on file to calculate BMI.   Physical Exam: General exam: Elderly African-American male, not in distress Skin: No rashes, lesions or ulcers. HEENT: Atraumatic, normocephalic, no obvious bleeding Lungs: Clear to auscultation bilaterally, on low-flow oxygen  CVS: S1, S2, no murmur,   GI/Abd: Soft, nontender, nondistended, bowel sound present,   CNS: Somnolent, tries to open eyes on command. Psychiatry: Sad affect Extremities: No pedal edema, no calf tenderness,   Data Review: I have personally reviewed the laboratory data and studies available.  F/u labs ordered Unresulted Labs (From admission, onward)     Start      Ordered   12/07/23 0500  Basic metabolic panel  Daily,   R      12/06/23 0548   12/07/23 0410  Urine Culture  Once,   R  12/07/23 0410   12/06/23 0416  Occult Blood, Gastric Fluid (cup to lab)  Once,   URGENT        12/06/23 0415   12/06/23 0340  CBC with Differential  (Undifferentiated presentation (screening labs and basic nursing orders))  ONCE - STAT,   STAT        12/06/23 0340            Signed, Derryl Duval, MD [facility] 12/07/2023

## 2023-12-08 DIAGNOSIS — K922 Gastrointestinal hemorrhage, unspecified: Secondary | ICD-10-CM | POA: Diagnosis not present

## 2023-12-08 LAB — BASIC METABOLIC PANEL WITH GFR
Anion gap: 10 (ref 5–15)
BUN: 16 mg/dL (ref 8–23)
CO2: 25 mmol/L (ref 22–32)
Calcium: 8.6 mg/dL — ABNORMAL LOW (ref 8.9–10.3)
Chloride: 106 mmol/L (ref 98–111)
Creatinine, Ser: 1.77 mg/dL — ABNORMAL HIGH (ref 0.61–1.24)
GFR, Estimated: 39 mL/min — ABNORMAL LOW (ref >60)
Glucose, Bld: 105 mg/dL — ABNORMAL HIGH (ref 70–99)
Potassium: 4.2 mmol/L (ref 3.5–5.1)
Sodium: 141 mmol/L (ref 135–145)

## 2023-12-08 LAB — GLUCOSE, CAPILLARY: Glucose-Capillary: 122 mg/dL — ABNORMAL HIGH (ref 70–99)

## 2023-12-08 MED ORDER — PROCHLORPERAZINE MALEATE 10 MG PO TABS: 10.0000 mg | ORAL_TABLET | ORAL | 0 refills | Status: AC | PRN

## 2023-12-08 MED ORDER — POLYETHYLENE GLYCOL 3350 17 G PO PACK: 17.0000 g | PACK | Freq: Every day | ORAL | 0 refills | Status: AC

## 2023-12-08 MED ORDER — SENNOSIDES-DOCUSATE SODIUM 8.6-50 MG PO TABS: 1.0000 | ORAL_TABLET | Freq: Two times a day (BID) | ORAL | Status: AC

## 2023-12-08 NOTE — Progress Notes (Signed)
 Mobility Specialist Progress Note:    12/08/23 1200  Mobility  Activity Pivoted/transferred from bed to chair  Level of Assistance Maximum assist, patient does 25-49% (+2)  Assistive Device None  Distance Ambulated (ft) 3 ft  Range of Motion/Exercises Active;All extremities  Activity Response Tolerated well  Mobility Referral Yes  Mobility visit 1 Mobility  Mobility Specialist Start Time (ACUTE ONLY) 1200  Mobility Specialist Stop Time (ACUTE ONLY) 1213  Mobility Specialist Time Calculation (min) (ACUTE ONLY) 13 min   Pt received in chair, RN requesting assistance transferring to chair. Required MaxA+2 to stand and transfer with no AD. Tolerated well, confused. NT in room, all needs met.  Omarius Grantham Mobility Specialist Please contact via Special Educational Needs Teacher or  Rehab office at [phone]

## 2023-12-08 NOTE — Progress Notes (Signed)
 Pt discharged via [facility] to home. Pt's wife [name] has possession of pt's belongings at time of discharge.

## 2023-12-08 NOTE — Discharge Summary (Signed)
 Physician Discharge Summary  Arbie Blankley FMW:[id number] DOB: [birth year] DOA: 12/06/2023  PCP: Orpha Yancey LABOR, MD  Admit date: 12/06/2023 Discharge date: 12/08/2023  Admitted From: Home with hospice Disposition: Home with hospice  Recommendations for Outpatient Follow-up:  Follow up with PCP in 1 week  Call hospice/PCP or come to the ER if issues with uncontrolled pain/dyspnea/nausea vomiting.  Home Health: No Equipment/Devices: None  Discharge Condition: Stable CODE STATUS: DNR Diet recommendation: Heart healthy  Brief/Interim Summary:  Terry Stevens is a 78 y.o. male with PMH significant for dementia, DM 2, HTN, CHF, CKD 4, currently under hospice care. 10/28, brought to the ED for evaluation of coffee-ground emesis and fever. Most recently hospitalized 9/21-9/25 for multifocal pneumonia and discharged to SNF, subsequently discharged to home on 10/19.   Per report, she has been declining at home, has been refusing to take her medications, oral appetite and hydration has been declining as well.  Patient apparently has not had a bowel movement since discharge from the SNF.   Family took him to be seen by his PCP on 10/23 and was arranged home hospice services.   Symptoms continue at home.  The day prior to presentation, patient had several episodes of clear liquid emesis which later turned to coffee-ground appearance.  Hospice nurse also felt that patient had fever and hence sent to the ED.   In the ED, patient was afebrile, hemodynamically stable Labs with creatinine elevated to 2, normal WC count, lactic acid 1.2 Blood culture was collected Started on IV Protonix , IV Zofran  Admitted to    Received IV hydration with overall improvement.  He has had no further emesis.  Hemoglobin remained stable.  He was constipated and received a scheduled MiraLAX , Senokot as well as suppository.  Patient remained afebrile in the hospital.  Did not receive any antibiotics.  Was hypoxic in  the ER satting 83%.  Currently receiving 2 L/min of supplemental oxygen  saturating 98%.  Will wean down prior to discharge  Stable issues Type II DM  A1c was 7.5% in July 2025  On once daily insulin  at home.  Did not require it in the hospital. Due to poor intake, recommend continuing glucose monitoring at home and discontinue insulin  altogether.    Hypertension  On carvedilol , amlodipine , Aldactone , Lasix  at home.  Will discontinue amlodipine , Aldactone  and Lasix .  Chronic HFpEF  Appears compensated  Continue to hold diuretics due to poor intake and dehydration.    CKD IV  Appears to be at baseline    Patient is already established with home hospice services.  He is not cooperating to take oral pills.  I have discontinued nonessential oral medicines to minimize pill burden. Patient need to follow-up with PCP/hospice  Discharge Diagnoses:  Principal Problem:   GI bleeding Active Problems:   CKD (chronic kidney disease) stage 4, GFR 15-29 ml/min (HCC)   Diabetes mellitus without complication (HCC)   Hypertension   Chronic heart failure with preserved ejection fraction (HFpEF) (HCC)   Acute respiratory failure with hypoxia [facility])    Discharge Instructions  Discharge Instructions     Call MD for:  difficulty breathing, headache or visual disturbances   Complete by: As directed    Call MD for:  persistant nausea and vomiting   Complete by: As directed    Call MD for:  severe uncontrolled pain   Complete by: As directed    Increase activity slowly   Complete by: As directed       Allergies as  of 12/08/2023       Reactions   Penicillins Other (See Comments)   Unsure of reaction per patient. Per pt's wife, not allergic        Medication List     STOP taking these medications    allopurinol  100 MG tablet Commonly known as: ZYLOPRIM    amLODipine  5 MG tablet Commonly known as: NORVASC    Aspirin  Low Dose 81 MG tablet Generic drug: aspirin  EC   atorvastatin   20 MG tablet Commonly known as: LIPITOR   furosemide  20 MG tablet Commonly known as: LASIX    spironolactone  25 MG tablet Commonly known as: ALDACTONE    traZODone  100 MG tablet Commonly known as: DESYREL        TAKE these medications    atropine 1 % ophthalmic solution See admin instructions. 4 drops under tongue every 6 hours for discretions.   carvedilol  25 MG tablet Commonly known as: COREG  Take 25 mg by mouth 2 (two) times daily.   cyanocobalamin  500 MCG tablet Commonly known as: VITAMIN B12 Take 1 tablet (500 mcg total) by mouth daily.   donepezil  10 MG tablet Commonly known as: ARICEPT  Take 10 mg by mouth every evening.   famotidine  20 MG tablet Commonly known as: PEPCID  Take 20 mg by mouth at bedtime.   folic acid  1 MG tablet Commonly known as: FOLVITE  Take 1 tablet by mouth daily.   LORazepam 2 MG/ML concentrated solution Commonly known as: ATIVAN Take 0.25 mLs by mouth every 4 (four) hours as needed for anxiety.   memantine  10 MG tablet Commonly known as: NAMENDA  Take 10 mg by mouth 2 (two) times daily.   metoCLOPramide  5 MG tablet Commonly known as: REGLAN  Take 5 mg by mouth 3 (three) times daily.   morphine  CONCENTRATE 10 mg / 0.5 ml concentrated solution Take 0.25 mLs by mouth every 4 (four) hours as needed for severe pain (pain score 7-10).   pantoprazole  40 MG tablet Commonly known as: Protonix  Take 1 tablet (40 mg total) by mouth 2 (two) times daily.   polyethylene glycol 17 g packet Commonly known as: MIRALAX  / GLYCOLAX  Take 17 g by mouth daily. What changed:  when to take this reasons to take this   prochlorperazine  10 MG tablet Commonly known as: COMPAZINE  Take 1 tablet (10 mg total) by mouth every 4 (four) hours as needed for nausea or vomiting. May crush, mix with water  and give sublingually.   promethazine -dextromethorphan 6.25-15 MG/5ML syrup Commonly known as: PROMETHAZINE -DM   risperiDONE 1 MG tablet Commonly known as:  RISPERDAL Take 1 mg by mouth 2 (two) times daily.   senna-docusate 8.6-50 MG tablet Commonly known as: Senokot-S Take 1 tablet by mouth 2 (two) times daily. What changed: when to take this   sodium bicarbonate  650 MG tablet Take 1 tablet (650 mg total) by mouth 2 (two) times daily.   terazosin  5 MG capsule Commonly known as: HYTRIN  Take 5 mg by mouth at bedtime.   Tresiba  FlexTouch 100 UNIT/ML FlexTouch Pen Generic drug: insulin  degludec Inject 10 Units into the skin daily. What changed: how much to take        Follow-up Information     Bacchus, Meade PEDLAR, FNP. Call in 1 week(s).   Specialty: Family Medicine Contact information: [address] [place] [address] [phone]                Allergies  Allergen Reactions   Penicillins Other (See Comments)    Unsure of reaction per  patient. Per pt's wife, not allergic    Consultations:    Procedures/Studies: DG Chest Port 1 View Result Date: 12/06/2023 EXAM: 1 VIEW XRAY OF THE CHEST 12/06/2023 04:06:00 AM COMPARISON: None available. CLINICAL HISTORY: Questionable sepsis - evaluate for abnormality. FINDINGS: LUNGS AND PLEURA: Low lung volumes. Resultant perihilar bronchovascular crowding. No focal pulmonary opacity. No pulmonary edema. No pleural effusion. No pneumothorax. HEART AND MEDIASTINUM: Mild cardiomegaly. BONES AND SOFT TISSUES: No acute osseous abnormality. IMPRESSION: 1. No acute findings. Pulmonary hypoinflation. Mild cardiomegaly. Electronically signed by: Dorethia Molt MD 12/06/2023 04:11 AM EDT RP Workstation: [id number]      Subjective:   Discharge Exam: Vitals:   12/07/23 2112 12/08/23 0504  BP: (!) 148/71 (!) 142/78  Pulse: 66 (!) 58  Resp: 18   Temp: 98.3 F (36.8 C) 98.4 F (36.9 C)  SpO2: 98% 100%    General: Pt is alert, awake, not in acute distress Cardiovascular: rate controlled, S1/S2 + Respiratory: bilateral decreased breath sounds at bases Abdominal: Soft, NT,  ND, bowel sounds + Extremities: no edema, no cyanosis    The results of significant diagnostics from this hospitalization (including imaging, microbiology, ancillary and laboratory) are listed below for reference.     Microbiology: Recent Results (from the past 240 hours)  Blood Culture (routine x 2)     Status: None (Preliminary result)   Collection Time: 12/06/23  3:38 AM   Specimen: BLOOD RIGHT FOREARM  Result Value Ref Range Status   Specimen Description BLOOD RIGHT FOREARM  Final   Special Requests   Final    BOTTLES DRAWN AEROBIC AND ANAEROBIC Blood Culture adequate volume   Culture   Final    NO GROWTH 2 DAYS Performed at [facility], [address], [place], [place] [address]    Report Status PENDING  Incomplete  Blood Culture (routine x 2)     Status: None (Preliminary result)   Collection Time: 12/06/23  3:42 AM   Specimen: BLOOD LEFT FOREARM  Result Value Ref Range Status   Specimen Description BLOOD LEFT FOREARM  Final   Special Requests   Final    BOTTLES DRAWN AEROBIC AND ANAEROBIC Blood Culture adequate volume   Culture   Final    NO GROWTH 2 DAYS Performed at [facility], [address], [place], [place] [address]    Report Status PENDING  Incomplete  Urine Culture     Status: None (Preliminary result)   Collection Time: 12/07/23  4:10 AM   Specimen: Urine, Random  Result Value Ref Range Status   Specimen Description   Final    URINE, RANDOM Performed at [facility], [address] [place], [place] [address]    Special Requests   Final    NONE Reflexed from [id number] Performed at [facility], [address] [place], [place] [address]    Culture   Final    CULTURE REINCUBATED FOR BETTER GROWTH Performed at [facility], [address], [place], [place] [address]    Report Status PENDING  Incomplete     Labs: BNP (last 3 results) Recent Labs    09/15/23 1045 10/30/23 1203  BNP 59.0 64.0   Basic Metabolic Panel: Recent Labs  Lab  12/06/23 0338 12/07/23 0420 12/08/23 0418  NA 141 140 141  K 4.4 4.6 4.2  CL 104 107 106  CO2 27 25 25   GLUCOSE 163* 143* 105*  BUN 17 20 16   CREATININE 2.00* 1.98* 1.77*  CALCIUM  9.3 8.7* 8.6*  Liver Function Tests: Recent Labs  Lab 12/06/23 0338  AST 26  ALT 10  ALKPHOS 103  BILITOT 0.7  PROT 7.1  ALBUMIN 3.7   No results for input(s): LIPASE, AMYLASE in the last 168 hours. No results for input(s): AMMONIA in the last 168 hours. CBC: Recent Labs  Lab 12/06/23 0509 12/06/23 1003 12/07/23 0420  WBC 6.5  --  7.1  NEUTROABS 4.3  --  5.3  HGB 11.7* 11.3* 11.8*  HCT 38.1* 37.7* 40.3  MCV 88.4  --  93.1  PLT 134*  --  121*   Cardiac Enzymes: No results for input(s): CKTOTAL, CKMB, CKMBINDEX, TROPONINI in the last 168 hours. BNP: Invalid input(s): POCBNP CBG: Recent Labs  Lab 12/07/23 0748 12/07/23 1139 12/07/23 1650 12/07/23 2115 12/08/23 0500  GLUCAP 131* 110* 145* 137* 122*   D-Dimer No results for input(s): DDIMER in the last 72 hours. Hgb A1c No results for input(s): HGBA1C in the last 72 hours. Lipid Profile No results for input(s): CHOL, HDL, LDLCALC, TRIG, CHOLHDL, LDLDIRECT in the last 72 hours. Thyroid function studies No results for input(s): TSH, T4TOTAL, T3FREE, THYROIDAB in the last 72 hours.  Invalid input(s): FREET3 Anemia work up No results for input(s): VITAMINB12, FOLATE, FERRITIN, TIBC, IRON, RETICCTPCT in the last 72 hours. Urinalysis    Component Value Date/Time   COLORURINE YELLOW 12/07/2023 0410   APPEARANCEUR HAZY (A) 12/07/2023 0410   LABSPEC 1.019 12/07/2023 0410   PHURINE 6.0 12/07/2023 0410   GLUCOSEU NEGATIVE 12/07/2023 0410   HGBUR NEGATIVE 12/07/2023 0410   BILIRUBINUR NEGATIVE 12/07/2023 0410   KETONESUR NEGATIVE 12/07/2023 0410   PROTEINUR NEGATIVE 12/07/2023 0410   NITRITE NEGATIVE 12/07/2023 0410   LEUKOCYTESUR NEGATIVE 12/07/2023 0410   Sepsis  Labs Recent Labs  Lab 12/06/23 0509 12/07/23 0420  WBC 6.5 7.1   Microbiology Recent Results (from the past 240 hours)  Blood Culture (routine x 2)     Status: None (Preliminary result)   Collection Time: 12/06/23  3:38 AM   Specimen: BLOOD RIGHT FOREARM  Result Value Ref Range Status   Specimen Description BLOOD RIGHT FOREARM  Final   Special Requests   Final    BOTTLES DRAWN AEROBIC AND ANAEROBIC Blood Culture adequate volume   Culture   Final    NO GROWTH 2 DAYS Performed at [facility], [address], [place], [place] [address]    Report Status PENDING  Incomplete  Blood Culture (routine x 2)     Status: None (Preliminary result)   Collection Time: 12/06/23  3:42 AM   Specimen: BLOOD LEFT FOREARM  Result Value Ref Range Status   Specimen Description BLOOD LEFT FOREARM  Final   Special Requests   Final    BOTTLES DRAWN AEROBIC AND ANAEROBIC Blood Culture adequate volume   Culture   Final    NO GROWTH 2 DAYS Performed at [facility], [address], [place], [place] [address]    Report Status PENDING  Incomplete  Urine Culture     Status: None (Preliminary result)   Collection Time: 12/07/23  4:10 AM   Specimen: Urine, Random  Result Value Ref Range Status   Specimen Description   Final    URINE, RANDOM Performed at [facility], [address], [place], [place] [address]    Special Requests   Final    NONE Reflexed from [id number] Performed at [facility], [address], [place], [place] [address]    Culture   Final  CULTURE REINCUBATED FOR BETTER GROWTH Performed at [facility], [address], [place], [place] [address]    Report Status PENDING  Incomplete     Time coordinating discharge: 35 minutes  SIGNED:   Derryl Duval, MD  [facility] 12/08/2023, 11:18 AM

## 2023-12-08 NOTE — TOC Transition Note (Signed)
 Transition of Care [facility]) - Discharge Note   Patient Details  Name: Terry Stevens MRN: [id number] Date of Birth: 10/23/1945  [facility] CM/SW Contact:  Sharlyne Stabs, RN Phone Number: 12/08/2023, 12:37 PM   Clinical Narrative:   Patient is ready to discharge home, active with [facility] hospice care. Patient was on oxygen  during admission. Hospice updated. RN removed oxygen  and assessed. Hospice updated.  [facility] set up EMS transport. RN updated.    Final next level of care: Home w Hospice Care Barriers to Discharge: Barriers Resolved   Patient Goals and CMS Choice Patient states their goals for this hospitalization and ongoing recovery are:: For pt to return home with hospice CMS Medicare.gov Compare Post Acute Care list provided to:: Patient Represenative (must comment) Choice offered to / list presented to : Spouse      Discharge Placement                Patient to be transferred to facility by: EMS   Patient and family notified of of transfer: 12/08/23  Discharge Plan and Services Additional resources added to the After Visit Summary for   In-house Referral: Clinical Social Work Discharge Planning Services: NA Post Acute Care Choice: Hospice                Social Drivers of Health (SDOH) Interventions SDOH Screenings   Food Insecurity: Patient Unable To Answer (12/06/2023)  Housing: Unknown (12/06/2023)  Transportation Needs: Patient Unable To Answer (12/06/2023)  Utilities: Patient Unable To Answer (12/06/2023)  Alcohol Screen: Low Risk  (03/13/2023)  Depression (PHQ2-9): Low Risk  (03/13/2023)  Financial Resource Strain: Low Risk  (03/13/2023)  Physical Activity: Inactive (03/13/2023)  Social Connections: Patient Unable To Answer (12/06/2023)  Stress: No Stress Concern Present (03/13/2023)  Tobacco Use: Medium Risk (12/06/2023)  Health Literacy: Adequate Health Literacy (03/13/2023)

## 2023-12-08 NOTE — Progress Notes (Signed)
 Mobility Specialist Progress Note:    12/08/23 1240  Mobility  Activity Pivoted/transferred from chair to bed  Level of Assistance Maximum assist, patient does 25-49% (+2)  Assistive Device None  Distance Ambulated (ft) 3 ft  Range of Motion/Exercises Active;All extremities  Activity Response Tolerated well  Mobility Referral Yes  Mobility visit 1 Mobility  Mobility Specialist Start Time (ACUTE ONLY) 1240  Mobility Specialist Stop Time (ACUTE ONLY) 1255  Mobility Specialist Time Calculation (min) (ACUTE ONLY) 15 min   Pt received in chair, NT requesting assistance with transfer to bed for EMS. Required MaxA+2 to stand and transfer with no AD. Tolerated well, BLE weakness. All needs met.  Perl Folmar Mobility Specialist Please contact via Special Educational Needs Teacher or  Rehab office at [phone]

## 2023-12-09 ENCOUNTER — Telehealth: Payer: Self-pay

## 2023-12-09 LAB — URINE CULTURE: Culture: 70000 — AB

## 2023-12-09 NOTE — Transitions of Care (Post Inpatient/ED Visit) (Signed)
   12/09/2023  Name: Furkan Keenum MRN: [id number] DOB: [birth year]  Today's TOC FU Call Status: Today's TOC FU Call Status:: Successful TOC FU Call Completed TOC FU Call Complete Date: 12/09/23 Patient's Name and Date of Birth confirmed.  Transition Care Management Follow-up Telephone Call Date of Discharge: 12/08/23 [facility] Type of Discharge: Inpatient Admission Primary Inpatient Discharge Diagnosis:: GI Bleed Any questions or concerns?: No  Items Reviewed: Did you receive and understand the discharge instructions provided?: Yes  RN made outreach to patient/family and confirmed that hospice services are in place.  Patient/family aware to contact hospice 24/7 for ay questions or concerns. No further interventions at this time.     Ardon Franklin J. Ambrielle Kington RN, MSN [facility], [facility] RN Care Manager Direct Dial: [phone]  Fax: [phone] Website: [email or web address]

## 2023-12-11 LAB — CULTURE, BLOOD (ROUTINE X 2)
Culture: NO GROWTH
Culture: NO GROWTH
Special Requests: ADEQUATE
Special Requests: ADEQUATE

## 2024-01-09 DEATH — deceased
